# Patient Record
Sex: Female | Born: 1935 | Race: White | Hispanic: No | Smoking: Never smoker
Health system: Southern US, Community
[De-identification: ages and names within clinical notes are randomized; demographics above are authoritative.]

## PROBLEM LIST (undated history)

## (undated) DIAGNOSIS — W19XXXA Unspecified fall, initial encounter: Secondary | ICD-10-CM

## (undated) DIAGNOSIS — H44009 Unspecified purulent endophthalmitis, unspecified eye: Secondary | ICD-10-CM

## (undated) DIAGNOSIS — S329XXA Fracture of unspecified parts of lumbosacral spine and pelvis, initial encounter for closed fracture: Secondary | ICD-10-CM

## (undated) DIAGNOSIS — C801 Malignant (primary) neoplasm, unspecified: Secondary | ICD-10-CM

## (undated) DIAGNOSIS — R296 Repeated falls: Secondary | ICD-10-CM

## (undated) HISTORY — DX: Malignant (primary) neoplasm, unspecified: C80.1

## (undated) HISTORY — PX: ABDOMINAL HYSTERECTOMY: SHX81

---

## 2005-02-28 ENCOUNTER — Ambulatory Visit: Payer: Self-pay | Admitting: Family Medicine

## 2005-03-02 ENCOUNTER — Ambulatory Visit: Payer: Self-pay | Admitting: Family Medicine

## 2005-09-14 ENCOUNTER — Ambulatory Visit: Payer: Self-pay | Admitting: General Surgery

## 2006-02-21 ENCOUNTER — Other Ambulatory Visit: Payer: Self-pay

## 2006-02-21 ENCOUNTER — Emergency Department: Payer: Self-pay | Admitting: Emergency Medicine

## 2006-05-11 ENCOUNTER — Ambulatory Visit: Payer: Self-pay | Admitting: Family Medicine

## 2006-05-21 ENCOUNTER — Other Ambulatory Visit: Payer: Self-pay

## 2006-05-22 ENCOUNTER — Inpatient Hospital Stay: Payer: Self-pay

## 2007-06-05 ENCOUNTER — Ambulatory Visit: Payer: Self-pay | Admitting: Family Medicine

## 2007-06-11 ENCOUNTER — Ambulatory Visit: Payer: Self-pay | Admitting: Family Medicine

## 2008-06-05 ENCOUNTER — Ambulatory Visit: Payer: Self-pay | Admitting: Family Medicine

## 2008-10-29 ENCOUNTER — Ambulatory Visit: Payer: Self-pay | Admitting: Family Medicine

## 2008-11-19 ENCOUNTER — Encounter: Payer: Self-pay | Admitting: Family Medicine

## 2008-12-12 ENCOUNTER — Encounter: Payer: Self-pay | Admitting: Family Medicine

## 2012-01-03 DIAGNOSIS — H4010X0 Unspecified open-angle glaucoma, stage unspecified: Secondary | ICD-10-CM | POA: Diagnosis not present

## 2012-01-04 DIAGNOSIS — Z1331 Encounter for screening for depression: Secondary | ICD-10-CM | POA: Diagnosis not present

## 2012-01-04 DIAGNOSIS — Z23 Encounter for immunization: Secondary | ICD-10-CM | POA: Diagnosis not present

## 2012-01-04 DIAGNOSIS — Z1339 Encounter for screening examination for other mental health and behavioral disorders: Secondary | ICD-10-CM | POA: Diagnosis not present

## 2012-01-04 DIAGNOSIS — Z0000 Encounter for general adult medical examination without abnormal findings: Secondary | ICD-10-CM | POA: Diagnosis not present

## 2012-04-15 ENCOUNTER — Inpatient Hospital Stay: Payer: Self-pay | Admitting: Internal Medicine

## 2012-04-15 DIAGNOSIS — E871 Hypo-osmolality and hyponatremia: Secondary | ICD-10-CM | POA: Diagnosis not present

## 2012-04-15 DIAGNOSIS — K625 Hemorrhage of anus and rectum: Secondary | ICD-10-CM | POA: Diagnosis not present

## 2012-04-15 DIAGNOSIS — E032 Hypothyroidism due to medicaments and other exogenous substances: Secondary | ICD-10-CM | POA: Diagnosis not present

## 2012-04-15 DIAGNOSIS — K559 Vascular disorder of intestine, unspecified: Secondary | ICD-10-CM | POA: Diagnosis not present

## 2012-04-15 DIAGNOSIS — M810 Age-related osteoporosis without current pathological fracture: Secondary | ICD-10-CM | POA: Diagnosis present

## 2012-04-15 DIAGNOSIS — K922 Gastrointestinal hemorrhage, unspecified: Secondary | ICD-10-CM | POA: Diagnosis not present

## 2012-04-15 DIAGNOSIS — D62 Acute posthemorrhagic anemia: Secondary | ICD-10-CM | POA: Diagnosis not present

## 2012-04-15 DIAGNOSIS — Z801 Family history of malignant neoplasm of trachea, bronchus and lung: Secondary | ICD-10-CM | POA: Diagnosis not present

## 2012-04-15 DIAGNOSIS — Z8249 Family history of ischemic heart disease and other diseases of the circulatory system: Secondary | ICD-10-CM | POA: Diagnosis not present

## 2012-04-15 DIAGNOSIS — Z86010 Personal history of colonic polyps: Secondary | ICD-10-CM | POA: Diagnosis not present

## 2012-04-15 DIAGNOSIS — E039 Hypothyroidism, unspecified: Secondary | ICD-10-CM | POA: Diagnosis present

## 2012-04-15 DIAGNOSIS — Z79899 Other long term (current) drug therapy: Secondary | ICD-10-CM | POA: Diagnosis not present

## 2012-04-15 DIAGNOSIS — K5289 Other specified noninfective gastroenteritis and colitis: Secondary | ICD-10-CM | POA: Diagnosis not present

## 2012-04-15 DIAGNOSIS — Z90710 Acquired absence of both cervix and uterus: Secondary | ICD-10-CM | POA: Diagnosis not present

## 2012-04-15 DIAGNOSIS — K560 Paralytic ileus: Secondary | ICD-10-CM | POA: Diagnosis not present

## 2012-04-15 DIAGNOSIS — R6889 Other general symptoms and signs: Secondary | ICD-10-CM | POA: Diagnosis not present

## 2012-04-15 DIAGNOSIS — I10 Essential (primary) hypertension: Secondary | ICD-10-CM | POA: Diagnosis not present

## 2012-04-15 LAB — COMPREHENSIVE METABOLIC PANEL
Albumin: 4 g/dL (ref 3.4–5.0)
Bilirubin,Total: 0.7 mg/dL (ref 0.2–1.0)
Chloride: 101 mmol/L (ref 98–107)
Co2: 25 mmol/L (ref 21–32)
Creatinine: 0.8 mg/dL (ref 0.60–1.30)
EGFR (African American): >60
EGFR (Non-African Amer.): >60
Glucose: 137 mg/dL — ABNORMAL HIGH (ref 65–99)
SGOT(AST): 26 U/L (ref 15–37)
SGPT (ALT): 21 U/L
Sodium: 134 mmol/L — ABNORMAL LOW (ref 136–145)
Total Protein: 7.8 g/dL (ref 6.4–8.2)

## 2012-04-15 LAB — CBC
MCH: 30.9 pg (ref 26.0–34.0)
MCHC: 33.4 g/dL (ref 32.0–36.0)
Platelet: 210 10*3/uL (ref 150–440)
RBC: 5.04 10*6/uL (ref 3.80–5.20)
WBC: 14.3 10*3/uL — ABNORMAL HIGH (ref 3.6–11.0)

## 2012-04-15 LAB — HEMOGLOBIN: HGB: 14.4 g/dL (ref 12.0–16.0)

## 2012-04-15 LAB — WBCS, STOOL

## 2012-04-15 LAB — PROTIME-INR
INR: 1
Prothrombin Time: 13.5 secs (ref 11.5–14.7)

## 2012-04-15 LAB — APTT: Activated PTT: 28.8 secs (ref 23.6–35.9)

## 2012-04-15 LAB — LIPASE, BLOOD: Lipase: 180 U/L (ref 73–393)

## 2012-04-16 LAB — HEMOGLOBIN
HGB: 13.1 g/dL (ref 12.0–16.0)
HGB: 13.5 g/dL (ref 12.0–16.0)

## 2012-04-16 LAB — SODIUM: Sodium: 131 mmol/L — ABNORMAL LOW (ref 136–145)

## 2012-04-16 LAB — WBC: WBC: 15.7 10*3/uL — ABNORMAL HIGH (ref 3.6–11.0)

## 2012-04-17 LAB — STOOL CULTURE

## 2012-04-17 LAB — SODIUM: Sodium: 138 mmol/L (ref 136–145)

## 2012-04-17 LAB — HEMOGLOBIN: HGB: 12.8 g/dL (ref 12.0–16.0)

## 2012-04-17 LAB — WBC: WBC: 11.7 10*3/uL — ABNORMAL HIGH (ref 3.6–11.0)

## 2012-04-18 LAB — HEMOGLOBIN: HGB: 12.3 g/dL (ref 12.0–16.0)

## 2012-04-19 DIAGNOSIS — I10 Essential (primary) hypertension: Secondary | ICD-10-CM | POA: Diagnosis not present

## 2012-04-19 DIAGNOSIS — K922 Gastrointestinal hemorrhage, unspecified: Secondary | ICD-10-CM | POA: Diagnosis not present

## 2012-04-19 DIAGNOSIS — K559 Vascular disorder of intestine, unspecified: Secondary | ICD-10-CM | POA: Diagnosis not present

## 2012-04-19 DIAGNOSIS — R269 Unspecified abnormalities of gait and mobility: Secondary | ICD-10-CM | POA: Diagnosis not present

## 2012-04-19 DIAGNOSIS — D62 Acute posthemorrhagic anemia: Secondary | ICD-10-CM | POA: Diagnosis not present

## 2012-04-19 DIAGNOSIS — M6281 Muscle weakness (generalized): Secondary | ICD-10-CM | POA: Diagnosis not present

## 2012-04-20 DIAGNOSIS — M6281 Muscle weakness (generalized): Secondary | ICD-10-CM | POA: Diagnosis not present

## 2012-04-20 DIAGNOSIS — R269 Unspecified abnormalities of gait and mobility: Secondary | ICD-10-CM | POA: Diagnosis not present

## 2012-04-20 DIAGNOSIS — D62 Acute posthemorrhagic anemia: Secondary | ICD-10-CM | POA: Diagnosis not present

## 2012-04-20 DIAGNOSIS — K559 Vascular disorder of intestine, unspecified: Secondary | ICD-10-CM | POA: Diagnosis not present

## 2012-04-20 DIAGNOSIS — I10 Essential (primary) hypertension: Secondary | ICD-10-CM | POA: Diagnosis not present

## 2012-04-20 DIAGNOSIS — K922 Gastrointestinal hemorrhage, unspecified: Secondary | ICD-10-CM | POA: Diagnosis not present

## 2012-04-23 DIAGNOSIS — I10 Essential (primary) hypertension: Secondary | ICD-10-CM | POA: Diagnosis not present

## 2012-04-23 DIAGNOSIS — R269 Unspecified abnormalities of gait and mobility: Secondary | ICD-10-CM | POA: Diagnosis not present

## 2012-04-23 DIAGNOSIS — M6281 Muscle weakness (generalized): Secondary | ICD-10-CM | POA: Diagnosis not present

## 2012-04-23 DIAGNOSIS — K559 Vascular disorder of intestine, unspecified: Secondary | ICD-10-CM | POA: Diagnosis not present

## 2012-04-23 DIAGNOSIS — D62 Acute posthemorrhagic anemia: Secondary | ICD-10-CM | POA: Diagnosis not present

## 2012-04-23 DIAGNOSIS — K922 Gastrointestinal hemorrhage, unspecified: Secondary | ICD-10-CM | POA: Diagnosis not present

## 2012-04-24 DIAGNOSIS — K922 Gastrointestinal hemorrhage, unspecified: Secondary | ICD-10-CM | POA: Diagnosis not present

## 2012-04-24 DIAGNOSIS — M6281 Muscle weakness (generalized): Secondary | ICD-10-CM | POA: Diagnosis not present

## 2012-04-24 DIAGNOSIS — K559 Vascular disorder of intestine, unspecified: Secondary | ICD-10-CM | POA: Diagnosis not present

## 2012-04-24 DIAGNOSIS — Z09 Encounter for follow-up examination after completed treatment for conditions other than malignant neoplasm: Secondary | ICD-10-CM | POA: Diagnosis not present

## 2012-04-24 DIAGNOSIS — D62 Acute posthemorrhagic anemia: Secondary | ICD-10-CM | POA: Diagnosis not present

## 2012-04-24 DIAGNOSIS — Z23 Encounter for immunization: Secondary | ICD-10-CM | POA: Diagnosis not present

## 2012-04-24 DIAGNOSIS — I10 Essential (primary) hypertension: Secondary | ICD-10-CM | POA: Diagnosis not present

## 2012-04-24 DIAGNOSIS — Z1331 Encounter for screening for depression: Secondary | ICD-10-CM | POA: Diagnosis not present

## 2012-04-24 DIAGNOSIS — R269 Unspecified abnormalities of gait and mobility: Secondary | ICD-10-CM | POA: Diagnosis not present

## 2012-04-25 DIAGNOSIS — D62 Acute posthemorrhagic anemia: Secondary | ICD-10-CM | POA: Diagnosis not present

## 2012-04-25 DIAGNOSIS — Z09 Encounter for follow-up examination after completed treatment for conditions other than malignant neoplasm: Secondary | ICD-10-CM | POA: Diagnosis not present

## 2012-04-25 DIAGNOSIS — I10 Essential (primary) hypertension: Secondary | ICD-10-CM | POA: Diagnosis not present

## 2012-04-25 DIAGNOSIS — K559 Vascular disorder of intestine, unspecified: Secondary | ICD-10-CM | POA: Diagnosis not present

## 2012-04-25 DIAGNOSIS — M6281 Muscle weakness (generalized): Secondary | ICD-10-CM | POA: Diagnosis not present

## 2012-04-25 DIAGNOSIS — K922 Gastrointestinal hemorrhage, unspecified: Secondary | ICD-10-CM | POA: Diagnosis not present

## 2012-04-25 DIAGNOSIS — R269 Unspecified abnormalities of gait and mobility: Secondary | ICD-10-CM | POA: Diagnosis not present

## 2012-04-26 DIAGNOSIS — K922 Gastrointestinal hemorrhage, unspecified: Secondary | ICD-10-CM | POA: Diagnosis not present

## 2012-04-26 DIAGNOSIS — I10 Essential (primary) hypertension: Secondary | ICD-10-CM | POA: Diagnosis not present

## 2012-04-26 DIAGNOSIS — D62 Acute posthemorrhagic anemia: Secondary | ICD-10-CM | POA: Diagnosis not present

## 2012-04-26 DIAGNOSIS — K559 Vascular disorder of intestine, unspecified: Secondary | ICD-10-CM | POA: Diagnosis not present

## 2012-04-26 DIAGNOSIS — M6281 Muscle weakness (generalized): Secondary | ICD-10-CM | POA: Diagnosis not present

## 2012-04-26 DIAGNOSIS — R269 Unspecified abnormalities of gait and mobility: Secondary | ICD-10-CM | POA: Diagnosis not present

## 2012-04-30 DIAGNOSIS — R269 Unspecified abnormalities of gait and mobility: Secondary | ICD-10-CM | POA: Diagnosis not present

## 2012-04-30 DIAGNOSIS — I10 Essential (primary) hypertension: Secondary | ICD-10-CM | POA: Diagnosis not present

## 2012-04-30 DIAGNOSIS — M6281 Muscle weakness (generalized): Secondary | ICD-10-CM | POA: Diagnosis not present

## 2012-04-30 DIAGNOSIS — K922 Gastrointestinal hemorrhage, unspecified: Secondary | ICD-10-CM | POA: Diagnosis not present

## 2012-04-30 DIAGNOSIS — K559 Vascular disorder of intestine, unspecified: Secondary | ICD-10-CM | POA: Diagnosis not present

## 2012-04-30 DIAGNOSIS — D62 Acute posthemorrhagic anemia: Secondary | ICD-10-CM | POA: Diagnosis not present

## 2012-05-01 DIAGNOSIS — R269 Unspecified abnormalities of gait and mobility: Secondary | ICD-10-CM | POA: Diagnosis not present

## 2012-05-01 DIAGNOSIS — D62 Acute posthemorrhagic anemia: Secondary | ICD-10-CM | POA: Diagnosis not present

## 2012-05-01 DIAGNOSIS — I10 Essential (primary) hypertension: Secondary | ICD-10-CM | POA: Diagnosis not present

## 2012-05-01 DIAGNOSIS — K922 Gastrointestinal hemorrhage, unspecified: Secondary | ICD-10-CM | POA: Diagnosis not present

## 2012-05-01 DIAGNOSIS — M6281 Muscle weakness (generalized): Secondary | ICD-10-CM | POA: Diagnosis not present

## 2012-05-01 DIAGNOSIS — K559 Vascular disorder of intestine, unspecified: Secondary | ICD-10-CM | POA: Diagnosis not present

## 2012-05-02 DIAGNOSIS — D62 Acute posthemorrhagic anemia: Secondary | ICD-10-CM | POA: Diagnosis not present

## 2012-05-02 DIAGNOSIS — R269 Unspecified abnormalities of gait and mobility: Secondary | ICD-10-CM | POA: Diagnosis not present

## 2012-05-02 DIAGNOSIS — M6281 Muscle weakness (generalized): Secondary | ICD-10-CM | POA: Diagnosis not present

## 2012-05-02 DIAGNOSIS — K922 Gastrointestinal hemorrhage, unspecified: Secondary | ICD-10-CM | POA: Diagnosis not present

## 2012-05-02 DIAGNOSIS — I10 Essential (primary) hypertension: Secondary | ICD-10-CM | POA: Diagnosis not present

## 2012-05-02 DIAGNOSIS — K559 Vascular disorder of intestine, unspecified: Secondary | ICD-10-CM | POA: Diagnosis not present

## 2012-05-03 DIAGNOSIS — R269 Unspecified abnormalities of gait and mobility: Secondary | ICD-10-CM | POA: Diagnosis not present

## 2012-05-03 DIAGNOSIS — I10 Essential (primary) hypertension: Secondary | ICD-10-CM | POA: Diagnosis not present

## 2012-05-03 DIAGNOSIS — M6281 Muscle weakness (generalized): Secondary | ICD-10-CM | POA: Diagnosis not present

## 2012-05-03 DIAGNOSIS — D62 Acute posthemorrhagic anemia: Secondary | ICD-10-CM | POA: Diagnosis not present

## 2012-05-03 DIAGNOSIS — K922 Gastrointestinal hemorrhage, unspecified: Secondary | ICD-10-CM | POA: Diagnosis not present

## 2012-05-03 DIAGNOSIS — K559 Vascular disorder of intestine, unspecified: Secondary | ICD-10-CM | POA: Diagnosis not present

## 2012-05-08 DIAGNOSIS — K559 Vascular disorder of intestine, unspecified: Secondary | ICD-10-CM | POA: Diagnosis not present

## 2012-05-08 DIAGNOSIS — M6281 Muscle weakness (generalized): Secondary | ICD-10-CM | POA: Diagnosis not present

## 2012-05-08 DIAGNOSIS — I10 Essential (primary) hypertension: Secondary | ICD-10-CM | POA: Diagnosis not present

## 2012-05-08 DIAGNOSIS — D62 Acute posthemorrhagic anemia: Secondary | ICD-10-CM | POA: Diagnosis not present

## 2012-05-08 DIAGNOSIS — R269 Unspecified abnormalities of gait and mobility: Secondary | ICD-10-CM | POA: Diagnosis not present

## 2012-05-08 DIAGNOSIS — K922 Gastrointestinal hemorrhage, unspecified: Secondary | ICD-10-CM | POA: Diagnosis not present

## 2012-05-10 DIAGNOSIS — K559 Vascular disorder of intestine, unspecified: Secondary | ICD-10-CM | POA: Diagnosis not present

## 2012-05-10 DIAGNOSIS — D62 Acute posthemorrhagic anemia: Secondary | ICD-10-CM | POA: Diagnosis not present

## 2012-05-10 DIAGNOSIS — K922 Gastrointestinal hemorrhage, unspecified: Secondary | ICD-10-CM | POA: Diagnosis not present

## 2012-05-10 DIAGNOSIS — R269 Unspecified abnormalities of gait and mobility: Secondary | ICD-10-CM | POA: Diagnosis not present

## 2012-05-10 DIAGNOSIS — M6281 Muscle weakness (generalized): Secondary | ICD-10-CM | POA: Diagnosis not present

## 2012-05-10 DIAGNOSIS — I10 Essential (primary) hypertension: Secondary | ICD-10-CM | POA: Diagnosis not present

## 2012-05-14 DIAGNOSIS — K922 Gastrointestinal hemorrhage, unspecified: Secondary | ICD-10-CM | POA: Diagnosis not present

## 2012-05-14 DIAGNOSIS — I10 Essential (primary) hypertension: Secondary | ICD-10-CM | POA: Diagnosis not present

## 2012-05-14 DIAGNOSIS — R269 Unspecified abnormalities of gait and mobility: Secondary | ICD-10-CM | POA: Diagnosis not present

## 2012-05-14 DIAGNOSIS — D62 Acute posthemorrhagic anemia: Secondary | ICD-10-CM | POA: Diagnosis not present

## 2012-05-14 DIAGNOSIS — M6281 Muscle weakness (generalized): Secondary | ICD-10-CM | POA: Diagnosis not present

## 2012-05-14 DIAGNOSIS — K559 Vascular disorder of intestine, unspecified: Secondary | ICD-10-CM | POA: Diagnosis not present

## 2012-05-16 DIAGNOSIS — R269 Unspecified abnormalities of gait and mobility: Secondary | ICD-10-CM | POA: Diagnosis not present

## 2012-05-16 DIAGNOSIS — D62 Acute posthemorrhagic anemia: Secondary | ICD-10-CM | POA: Diagnosis not present

## 2012-05-16 DIAGNOSIS — I10 Essential (primary) hypertension: Secondary | ICD-10-CM | POA: Diagnosis not present

## 2012-05-16 DIAGNOSIS — K922 Gastrointestinal hemorrhage, unspecified: Secondary | ICD-10-CM | POA: Diagnosis not present

## 2012-05-22 DIAGNOSIS — M6281 Muscle weakness (generalized): Secondary | ICD-10-CM | POA: Diagnosis not present

## 2012-05-22 DIAGNOSIS — D62 Acute posthemorrhagic anemia: Secondary | ICD-10-CM | POA: Diagnosis not present

## 2012-05-22 DIAGNOSIS — R269 Unspecified abnormalities of gait and mobility: Secondary | ICD-10-CM | POA: Diagnosis not present

## 2012-05-22 DIAGNOSIS — K922 Gastrointestinal hemorrhage, unspecified: Secondary | ICD-10-CM | POA: Diagnosis not present

## 2012-05-22 DIAGNOSIS — K559 Vascular disorder of intestine, unspecified: Secondary | ICD-10-CM | POA: Diagnosis not present

## 2012-05-22 DIAGNOSIS — I10 Essential (primary) hypertension: Secondary | ICD-10-CM | POA: Diagnosis not present

## 2012-05-31 DIAGNOSIS — M1990 Unspecified osteoarthritis, unspecified site: Secondary | ICD-10-CM | POA: Diagnosis not present

## 2012-05-31 DIAGNOSIS — E039 Hypothyroidism, unspecified: Secondary | ICD-10-CM | POA: Diagnosis not present

## 2012-05-31 DIAGNOSIS — I10 Essential (primary) hypertension: Secondary | ICD-10-CM | POA: Diagnosis not present

## 2012-05-31 DIAGNOSIS — K559 Vascular disorder of intestine, unspecified: Secondary | ICD-10-CM | POA: Diagnosis not present

## 2012-07-02 DIAGNOSIS — H40009 Preglaucoma, unspecified, unspecified eye: Secondary | ICD-10-CM | POA: Diagnosis not present

## 2012-08-06 ENCOUNTER — Inpatient Hospital Stay: Payer: Self-pay | Admitting: Orthopedic Surgery

## 2012-08-06 DIAGNOSIS — Z4789 Encounter for other orthopedic aftercare: Secondary | ICD-10-CM | POA: Diagnosis not present

## 2012-08-06 DIAGNOSIS — Z9181 History of falling: Secondary | ICD-10-CM | POA: Diagnosis not present

## 2012-08-06 DIAGNOSIS — Z8041 Family history of malignant neoplasm of ovary: Secondary | ICD-10-CM | POA: Diagnosis not present

## 2012-08-06 DIAGNOSIS — R9431 Abnormal electrocardiogram [ECG] [EKG]: Secondary | ICD-10-CM | POA: Diagnosis not present

## 2012-08-06 DIAGNOSIS — Z801 Family history of malignant neoplasm of trachea, bronchus and lung: Secondary | ICD-10-CM | POA: Diagnosis not present

## 2012-08-06 DIAGNOSIS — IMO0002 Reserved for concepts with insufficient information to code with codable children: Secondary | ICD-10-CM | POA: Diagnosis not present

## 2012-08-06 DIAGNOSIS — Z90710 Acquired absence of both cervix and uterus: Secondary | ICD-10-CM | POA: Diagnosis not present

## 2012-08-06 DIAGNOSIS — H409 Unspecified glaucoma: Secondary | ICD-10-CM | POA: Diagnosis not present

## 2012-08-06 DIAGNOSIS — Z0389 Encounter for observation for other suspected diseases and conditions ruled out: Secondary | ICD-10-CM | POA: Diagnosis not present

## 2012-08-06 DIAGNOSIS — S72009A Fracture of unspecified part of neck of unspecified femur, initial encounter for closed fracture: Secondary | ICD-10-CM | POA: Diagnosis not present

## 2012-08-06 DIAGNOSIS — I959 Hypotension, unspecified: Secondary | ICD-10-CM | POA: Diagnosis not present

## 2012-08-06 DIAGNOSIS — S72143A Displaced intertrochanteric fracture of unspecified femur, initial encounter for closed fracture: Secondary | ICD-10-CM | POA: Diagnosis not present

## 2012-08-06 DIAGNOSIS — Z01818 Encounter for other preprocedural examination: Secondary | ICD-10-CM | POA: Diagnosis not present

## 2012-08-06 DIAGNOSIS — Z8249 Family history of ischemic heart disease and other diseases of the circulatory system: Secondary | ICD-10-CM | POA: Diagnosis not present

## 2012-08-06 DIAGNOSIS — R079 Chest pain, unspecified: Secondary | ICD-10-CM | POA: Diagnosis not present

## 2012-08-06 DIAGNOSIS — Z803 Family history of malignant neoplasm of breast: Secondary | ICD-10-CM | POA: Diagnosis not present

## 2012-08-06 DIAGNOSIS — S7290XD Unspecified fracture of unspecified femur, subsequent encounter for closed fracture with routine healing: Secondary | ICD-10-CM | POA: Diagnosis not present

## 2012-08-06 DIAGNOSIS — Z79899 Other long term (current) drug therapy: Secondary | ICD-10-CM | POA: Diagnosis not present

## 2012-08-06 DIAGNOSIS — R6889 Other general symptoms and signs: Secondary | ICD-10-CM | POA: Diagnosis not present

## 2012-08-06 DIAGNOSIS — Z5189 Encounter for other specified aftercare: Secondary | ICD-10-CM | POA: Diagnosis not present

## 2012-08-06 DIAGNOSIS — R269 Unspecified abnormalities of gait and mobility: Secondary | ICD-10-CM | POA: Diagnosis not present

## 2012-08-06 DIAGNOSIS — Z8711 Personal history of peptic ulcer disease: Secondary | ICD-10-CM | POA: Diagnosis not present

## 2012-08-06 DIAGNOSIS — M810 Age-related osteoporosis without current pathological fracture: Secondary | ICD-10-CM | POA: Diagnosis not present

## 2012-08-06 DIAGNOSIS — E871 Hypo-osmolality and hyponatremia: Secondary | ICD-10-CM | POA: Diagnosis present

## 2012-08-06 DIAGNOSIS — M6281 Muscle weakness (generalized): Secondary | ICD-10-CM | POA: Diagnosis not present

## 2012-08-06 DIAGNOSIS — D62 Acute posthemorrhagic anemia: Secondary | ICD-10-CM | POA: Diagnosis not present

## 2012-08-06 DIAGNOSIS — Z01810 Encounter for preprocedural cardiovascular examination: Secondary | ICD-10-CM | POA: Diagnosis not present

## 2012-08-06 DIAGNOSIS — I10 Essential (primary) hypertension: Secondary | ICD-10-CM | POA: Diagnosis not present

## 2012-08-06 DIAGNOSIS — E039 Hypothyroidism, unspecified: Secondary | ICD-10-CM | POA: Diagnosis not present

## 2012-08-06 LAB — BASIC METABOLIC PANEL
Anion Gap: 6 — ABNORMAL LOW (ref 7–16)
Calcium, Total: 8.7 mg/dL (ref 8.5–10.1)
Chloride: 101 mmol/L (ref 98–107)
Co2: 26 mmol/L (ref 21–32)
EGFR (Non-African Amer.): 58 — ABNORMAL LOW
Osmolality: 271 (ref 275–301)

## 2012-08-06 LAB — CBC
HCT: 39.1 % (ref 35.0–47.0)
HGB: 13.7 g/dL (ref 12.0–16.0)
MCH: 32.1 pg (ref 26.0–34.0)
Platelet: 210 10*3/uL (ref 150–440)
RBC: 4.27 10*6/uL (ref 3.80–5.20)
RDW: 13.6 % (ref 11.5–14.5)
WBC: 7.5 10*3/uL (ref 3.6–11.0)

## 2012-08-06 LAB — URINALYSIS, COMPLETE
Bilirubin,UR: NEGATIVE
Blood: NEGATIVE
Nitrite: NEGATIVE
Ph: 8 (ref 4.5–8.0)
Protein: NEGATIVE
RBC,UR: 1 /HPF (ref 0–5)
Squamous Epithelial: <1

## 2012-08-06 LAB — CK TOTAL AND CKMB (NOT AT ARMC): CK-MB: 2.7 ng/mL (ref 0.5–3.6)

## 2012-08-06 LAB — TSH: Thyroid Stimulating Horm: 3.59 u[IU]/mL

## 2012-08-06 LAB — PROTIME-INR
INR: 1
Prothrombin Time: 13.7 secs (ref 11.5–14.7)

## 2012-08-07 DIAGNOSIS — S72009A Fracture of unspecified part of neck of unspecified femur, initial encounter for closed fracture: Secondary | ICD-10-CM | POA: Diagnosis not present

## 2012-08-07 LAB — CBC WITH DIFFERENTIAL/PLATELET
Basophil #: 0 10*3/uL (ref 0.0–0.1)
Basophil %: 0.3 %
Eosinophil #: 0.2 10*3/uL (ref 0.0–0.7)
Eosinophil %: 0.6 %
HGB: 12.1 g/dL (ref 12.0–16.0)
HGB: 9.2 g/dL — ABNORMAL LOW (ref 12.0–16.0)
Lymphocyte #: 2.1 10*3/uL (ref 1.0–3.6)
Lymphocyte #: 2.1 10*3/uL (ref 1.0–3.6)
Lymphocyte %: 15.4 %
MCH: 30.7 pg (ref 26.0–34.0)
MCHC: 33.3 g/dL (ref 32.0–36.0)
MCV: 92 fL (ref 80–100)
Monocyte #: 0.9 x10 3/mm (ref 0.2–0.9)
Monocyte #: 1.3 x10 3/mm — ABNORMAL HIGH (ref 0.2–0.9)
Monocyte %: 8.9 %
Neutrophil %: 73.4 %
Platelet: 177 10*3/uL (ref 150–440)
Platelet: 157 10*3/uL (ref 150–440)
RBC: 2.99 10*6/uL — ABNORMAL LOW (ref 3.80–5.20)
RDW: 13.4 % (ref 11.5–14.5)
RDW: 13.1 % (ref 11.5–14.5)
WBC: 10.4 10*3/uL (ref 3.6–11.0)

## 2012-08-07 LAB — BASIC METABOLIC PANEL
Anion Gap: 7 (ref 7–16)
BUN: 19 mg/dL — ABNORMAL HIGH (ref 7–18)
Calcium, Total: 8.3 mg/dL — ABNORMAL LOW (ref 8.5–10.1)
Creatinine: 0.53 mg/dL — ABNORMAL LOW (ref 0.60–1.30)
EGFR (Non-African Amer.): >60
Glucose: 108 mg/dL — ABNORMAL HIGH (ref 65–99)
Osmolality: 275 (ref 275–301)
Potassium: 3.7 mmol/L (ref 3.5–5.1)

## 2012-08-08 LAB — CBC WITH DIFFERENTIAL/PLATELET
Basophil #: 0 10*3/uL (ref 0.0–0.1)
Basophil %: 0.5 %
Eosinophil %: 2.3 %
HGB: 9 g/dL — ABNORMAL LOW (ref 12.0–16.0)
Lymphocyte %: 23.7 %
MCV: 92 fL (ref 80–100)
Monocyte #: 0.8 x10 3/mm (ref 0.2–0.9)
Neutrophil #: 4.5 10*3/uL (ref 1.4–6.5)
Neutrophil %: 62 %
Platelet: 141 10*3/uL — ABNORMAL LOW (ref 150–440)
RBC: 2.85 10*6/uL — ABNORMAL LOW (ref 3.80–5.20)
WBC: 7.3 10*3/uL (ref 3.6–11.0)

## 2012-08-08 LAB — BASIC METABOLIC PANEL
Anion Gap: 5 — ABNORMAL LOW (ref 7–16)
BUN: 15 mg/dL (ref 7–18)
Calcium, Total: 8.2 mg/dL — ABNORMAL LOW (ref 8.5–10.1)
Creatinine: 0.63 mg/dL (ref 0.60–1.30)
EGFR (African American): >60
EGFR (Non-African Amer.): >60
Glucose: 102 mg/dL — ABNORMAL HIGH (ref 65–99)
Osmolality: 273 (ref 275–301)
Potassium: 4.2 mmol/L (ref 3.5–5.1)
Sodium: 136 mmol/L (ref 136–145)

## 2012-08-09 LAB — CBC WITH DIFFERENTIAL/PLATELET
Basophil #: 0 10*3/uL (ref 0.0–0.1)
Eosinophil #: 0.1 10*3/uL (ref 0.0–0.7)
Eosinophil %: 1.6 %
HCT: 23.5 % — ABNORMAL LOW (ref 35.0–47.0)
Lymphocyte #: 1.6 10*3/uL (ref 1.0–3.6)
Lymphocyte %: 18.7 %
MCH: 32.7 pg (ref 26.0–34.0)
MCHC: 35.6 g/dL (ref 32.0–36.0)
MCV: 92 fL (ref 80–100)
Monocyte #: 1.1 x10 3/mm — ABNORMAL HIGH (ref 0.2–0.9)
Monocyte %: 13.3 %
Neutrophil #: 5.7 10*3/uL (ref 1.4–6.5)
Neutrophil %: 66.1 %
Platelet: 136 10*3/uL — ABNORMAL LOW (ref 150–440)
RDW: 13 % (ref 11.5–14.5)

## 2012-08-10 ENCOUNTER — Encounter: Payer: Self-pay | Admitting: Internal Medicine

## 2012-08-10 DIAGNOSIS — Z5189 Encounter for other specified aftercare: Secondary | ICD-10-CM | POA: Diagnosis not present

## 2012-08-10 DIAGNOSIS — S7290XD Unspecified fracture of unspecified femur, subsequent encounter for closed fracture with routine healing: Secondary | ICD-10-CM | POA: Diagnosis not present

## 2012-08-10 DIAGNOSIS — S72143A Displaced intertrochanteric fracture of unspecified femur, initial encounter for closed fracture: Secondary | ICD-10-CM | POA: Diagnosis not present

## 2012-08-10 DIAGNOSIS — M810 Age-related osteoporosis without current pathological fracture: Secondary | ICD-10-CM | POA: Diagnosis not present

## 2012-08-10 DIAGNOSIS — R6889 Other general symptoms and signs: Secondary | ICD-10-CM | POA: Diagnosis not present

## 2012-08-10 DIAGNOSIS — R269 Unspecified abnormalities of gait and mobility: Secondary | ICD-10-CM | POA: Diagnosis not present

## 2012-08-10 DIAGNOSIS — R9431 Abnormal electrocardiogram [ECG] [EKG]: Secondary | ICD-10-CM | POA: Diagnosis not present

## 2012-08-10 DIAGNOSIS — Z4789 Encounter for other orthopedic aftercare: Secondary | ICD-10-CM | POA: Diagnosis not present

## 2012-08-10 DIAGNOSIS — Z9181 History of falling: Secondary | ICD-10-CM | POA: Diagnosis not present

## 2012-08-10 DIAGNOSIS — I10 Essential (primary) hypertension: Secondary | ICD-10-CM | POA: Diagnosis not present

## 2012-08-10 DIAGNOSIS — E039 Hypothyroidism, unspecified: Secondary | ICD-10-CM | POA: Diagnosis not present

## 2012-08-10 DIAGNOSIS — M6281 Muscle weakness (generalized): Secondary | ICD-10-CM | POA: Diagnosis not present

## 2012-08-10 DIAGNOSIS — Z01818 Encounter for other preprocedural examination: Secondary | ICD-10-CM | POA: Diagnosis not present

## 2012-08-10 LAB — BASIC METABOLIC PANEL
BUN: 13 mg/dL (ref 7–18)
Calcium, Total: 7.7 mg/dL — ABNORMAL LOW (ref 8.5–10.1)
Chloride: 105 mmol/L (ref 98–107)
EGFR (African American): >60
EGFR (Non-African Amer.): >60
Glucose: 96 mg/dL (ref 65–99)
Osmolality: 274 (ref 275–301)
Potassium: 3.7 mmol/L (ref 3.5–5.1)
Sodium: 137 mmol/L (ref 136–145)

## 2012-08-10 LAB — CBC WITH DIFFERENTIAL/PLATELET
Basophil %: 0.4 %
Eosinophil #: 0.2 10*3/uL (ref 0.0–0.7)
Eosinophil %: 2.6 %
HCT: 26.3 % — ABNORMAL LOW (ref 35.0–47.0)
HGB: 9.3 g/dL — ABNORMAL LOW (ref 12.0–16.0)
Lymphocyte #: 1.6 10*3/uL (ref 1.0–3.6)
MCH: 31.9 pg (ref 26.0–34.0)
MCHC: 35.6 g/dL (ref 32.0–36.0)
MCV: 90 fL (ref 80–100)
Monocyte #: 0.9 x10 3/mm (ref 0.2–0.9)
Monocyte %: 10.7 %
Neutrophil #: 5.7 10*3/uL (ref 1.4–6.5)
Neutrophil %: 67.2 %
Platelet: 154 10*3/uL (ref 150–440)

## 2012-08-12 ENCOUNTER — Encounter: Payer: Self-pay | Admitting: Internal Medicine

## 2012-08-14 LAB — URINALYSIS, COMPLETE
Bacteria: NONE SEEN
Blood: NEGATIVE
Glucose,UR: NEGATIVE mg/dL (ref 0–75)
Leukocyte Esterase: NEGATIVE
Nitrite: NEGATIVE
Ph: 7 (ref 4.5–8.0)
Protein: NEGATIVE
RBC,UR: NONE SEEN /HPF (ref 0–5)
Specific Gravity: 1.014 (ref 1.003–1.030)

## 2012-08-14 LAB — COMPREHENSIVE METABOLIC PANEL
Albumin: 2.7 g/dL — ABNORMAL LOW (ref 3.4–5.0)
Alkaline Phosphatase: 79 U/L (ref 50–136)
Anion Gap: 7 (ref 7–16)
BUN: 20 mg/dL — ABNORMAL HIGH (ref 7–18)
Bilirubin,Total: 1 mg/dL (ref 0.2–1.0)
Creatinine: 0.74 mg/dL (ref 0.60–1.30)
SGPT (ALT): 22 U/L (ref 12–78)
Sodium: 136 mmol/L (ref 136–145)
Total Protein: 5.8 g/dL — ABNORMAL LOW (ref 6.4–8.2)

## 2012-08-14 LAB — CBC WITH DIFFERENTIAL/PLATELET
Basophil #: 0 10*3/uL (ref 0.0–0.1)
Lymphocyte #: 1.3 10*3/uL (ref 1.0–3.6)
Lymphocyte %: 17.7 %
MCH: 30.8 pg (ref 26.0–34.0)
MCV: 94 fL (ref 80–100)
Monocyte #: 0.7 x10 3/mm (ref 0.2–0.9)
Monocyte %: 9.9 %
Platelet: 314 10*3/uL (ref 150–440)
RDW: 13.9 % (ref 11.5–14.5)
WBC: 7.5 10*3/uL (ref 3.6–11.0)

## 2012-08-16 DIAGNOSIS — E039 Hypothyroidism, unspecified: Secondary | ICD-10-CM | POA: Diagnosis not present

## 2012-08-16 DIAGNOSIS — M810 Age-related osteoporosis without current pathological fracture: Secondary | ICD-10-CM | POA: Diagnosis not present

## 2012-08-16 DIAGNOSIS — I10 Essential (primary) hypertension: Secondary | ICD-10-CM | POA: Diagnosis not present

## 2012-08-16 LAB — URINE CULTURE

## 2012-08-22 DIAGNOSIS — S72143A Displaced intertrochanteric fracture of unspecified femur, initial encounter for closed fracture: Secondary | ICD-10-CM | POA: Diagnosis not present

## 2012-09-11 ENCOUNTER — Encounter: Payer: Self-pay | Admitting: Internal Medicine

## 2012-09-25 DIAGNOSIS — S72143A Displaced intertrochanteric fracture of unspecified femur, initial encounter for closed fracture: Secondary | ICD-10-CM | POA: Diagnosis not present

## 2012-09-26 DIAGNOSIS — IMO0001 Reserved for inherently not codable concepts without codable children: Secondary | ICD-10-CM | POA: Diagnosis not present

## 2012-09-26 DIAGNOSIS — H409 Unspecified glaucoma: Secondary | ICD-10-CM | POA: Diagnosis not present

## 2012-09-26 DIAGNOSIS — S7290XD Unspecified fracture of unspecified femur, subsequent encounter for closed fracture with routine healing: Secondary | ICD-10-CM | POA: Diagnosis not present

## 2012-09-26 DIAGNOSIS — R269 Unspecified abnormalities of gait and mobility: Secondary | ICD-10-CM | POA: Diagnosis not present

## 2012-09-26 DIAGNOSIS — M810 Age-related osteoporosis without current pathological fracture: Secondary | ICD-10-CM | POA: Diagnosis not present

## 2012-09-26 DIAGNOSIS — I10 Essential (primary) hypertension: Secondary | ICD-10-CM | POA: Diagnosis not present

## 2012-09-27 DIAGNOSIS — I10 Essential (primary) hypertension: Secondary | ICD-10-CM | POA: Diagnosis not present

## 2012-09-27 DIAGNOSIS — R269 Unspecified abnormalities of gait and mobility: Secondary | ICD-10-CM | POA: Diagnosis not present

## 2012-09-27 DIAGNOSIS — M810 Age-related osteoporosis without current pathological fracture: Secondary | ICD-10-CM | POA: Diagnosis not present

## 2012-09-27 DIAGNOSIS — IMO0001 Reserved for inherently not codable concepts without codable children: Secondary | ICD-10-CM | POA: Diagnosis not present

## 2012-09-27 DIAGNOSIS — S7290XD Unspecified fracture of unspecified femur, subsequent encounter for closed fracture with routine healing: Secondary | ICD-10-CM | POA: Diagnosis not present

## 2012-09-27 DIAGNOSIS — H409 Unspecified glaucoma: Secondary | ICD-10-CM | POA: Diagnosis not present

## 2012-10-01 DIAGNOSIS — I10 Essential (primary) hypertension: Secondary | ICD-10-CM | POA: Diagnosis not present

## 2012-10-01 DIAGNOSIS — R269 Unspecified abnormalities of gait and mobility: Secondary | ICD-10-CM | POA: Diagnosis not present

## 2012-10-01 DIAGNOSIS — H409 Unspecified glaucoma: Secondary | ICD-10-CM | POA: Diagnosis not present

## 2012-10-01 DIAGNOSIS — M810 Age-related osteoporosis without current pathological fracture: Secondary | ICD-10-CM | POA: Diagnosis not present

## 2012-10-01 DIAGNOSIS — S7290XD Unspecified fracture of unspecified femur, subsequent encounter for closed fracture with routine healing: Secondary | ICD-10-CM | POA: Diagnosis not present

## 2012-10-01 DIAGNOSIS — IMO0001 Reserved for inherently not codable concepts without codable children: Secondary | ICD-10-CM | POA: Diagnosis not present

## 2012-10-03 DIAGNOSIS — M810 Age-related osteoporosis without current pathological fracture: Secondary | ICD-10-CM | POA: Diagnosis not present

## 2012-10-03 DIAGNOSIS — IMO0001 Reserved for inherently not codable concepts without codable children: Secondary | ICD-10-CM | POA: Diagnosis not present

## 2012-10-03 DIAGNOSIS — H409 Unspecified glaucoma: Secondary | ICD-10-CM | POA: Diagnosis not present

## 2012-10-03 DIAGNOSIS — S7290XD Unspecified fracture of unspecified femur, subsequent encounter for closed fracture with routine healing: Secondary | ICD-10-CM | POA: Diagnosis not present

## 2012-10-03 DIAGNOSIS — R269 Unspecified abnormalities of gait and mobility: Secondary | ICD-10-CM | POA: Diagnosis not present

## 2012-10-03 DIAGNOSIS — I10 Essential (primary) hypertension: Secondary | ICD-10-CM | POA: Diagnosis not present

## 2012-10-04 DIAGNOSIS — H409 Unspecified glaucoma: Secondary | ICD-10-CM | POA: Diagnosis not present

## 2012-10-04 DIAGNOSIS — S7290XD Unspecified fracture of unspecified femur, subsequent encounter for closed fracture with routine healing: Secondary | ICD-10-CM | POA: Diagnosis not present

## 2012-10-04 DIAGNOSIS — M810 Age-related osteoporosis without current pathological fracture: Secondary | ICD-10-CM | POA: Diagnosis not present

## 2012-10-04 DIAGNOSIS — I10 Essential (primary) hypertension: Secondary | ICD-10-CM | POA: Diagnosis not present

## 2012-10-04 DIAGNOSIS — IMO0001 Reserved for inherently not codable concepts without codable children: Secondary | ICD-10-CM | POA: Diagnosis not present

## 2012-10-04 DIAGNOSIS — R269 Unspecified abnormalities of gait and mobility: Secondary | ICD-10-CM | POA: Diagnosis not present

## 2012-10-08 DIAGNOSIS — R269 Unspecified abnormalities of gait and mobility: Secondary | ICD-10-CM | POA: Diagnosis not present

## 2012-10-08 DIAGNOSIS — IMO0001 Reserved for inherently not codable concepts without codable children: Secondary | ICD-10-CM | POA: Diagnosis not present

## 2012-10-08 DIAGNOSIS — I10 Essential (primary) hypertension: Secondary | ICD-10-CM | POA: Diagnosis not present

## 2012-10-08 DIAGNOSIS — H409 Unspecified glaucoma: Secondary | ICD-10-CM | POA: Diagnosis not present

## 2012-10-08 DIAGNOSIS — S7290XD Unspecified fracture of unspecified femur, subsequent encounter for closed fracture with routine healing: Secondary | ICD-10-CM | POA: Diagnosis not present

## 2012-10-08 DIAGNOSIS — M810 Age-related osteoporosis without current pathological fracture: Secondary | ICD-10-CM | POA: Diagnosis not present

## 2012-10-10 DIAGNOSIS — S7290XD Unspecified fracture of unspecified femur, subsequent encounter for closed fracture with routine healing: Secondary | ICD-10-CM | POA: Diagnosis not present

## 2012-10-10 DIAGNOSIS — IMO0001 Reserved for inherently not codable concepts without codable children: Secondary | ICD-10-CM | POA: Diagnosis not present

## 2012-10-10 DIAGNOSIS — I10 Essential (primary) hypertension: Secondary | ICD-10-CM | POA: Diagnosis not present

## 2012-10-10 DIAGNOSIS — R269 Unspecified abnormalities of gait and mobility: Secondary | ICD-10-CM | POA: Diagnosis not present

## 2012-10-10 DIAGNOSIS — H409 Unspecified glaucoma: Secondary | ICD-10-CM | POA: Diagnosis not present

## 2012-10-10 DIAGNOSIS — M810 Age-related osteoporosis without current pathological fracture: Secondary | ICD-10-CM | POA: Diagnosis not present

## 2012-10-11 DIAGNOSIS — S7290XD Unspecified fracture of unspecified femur, subsequent encounter for closed fracture with routine healing: Secondary | ICD-10-CM | POA: Diagnosis not present

## 2012-10-11 DIAGNOSIS — H409 Unspecified glaucoma: Secondary | ICD-10-CM | POA: Diagnosis not present

## 2012-10-11 DIAGNOSIS — R269 Unspecified abnormalities of gait and mobility: Secondary | ICD-10-CM | POA: Diagnosis not present

## 2012-10-11 DIAGNOSIS — IMO0001 Reserved for inherently not codable concepts without codable children: Secondary | ICD-10-CM | POA: Diagnosis not present

## 2012-10-11 DIAGNOSIS — I10 Essential (primary) hypertension: Secondary | ICD-10-CM | POA: Diagnosis not present

## 2012-10-11 DIAGNOSIS — M810 Age-related osteoporosis without current pathological fracture: Secondary | ICD-10-CM | POA: Diagnosis not present

## 2012-10-17 DIAGNOSIS — R269 Unspecified abnormalities of gait and mobility: Secondary | ICD-10-CM | POA: Diagnosis not present

## 2012-10-17 DIAGNOSIS — IMO0001 Reserved for inherently not codable concepts without codable children: Secondary | ICD-10-CM | POA: Diagnosis not present

## 2012-10-17 DIAGNOSIS — H409 Unspecified glaucoma: Secondary | ICD-10-CM | POA: Diagnosis not present

## 2012-10-17 DIAGNOSIS — I10 Essential (primary) hypertension: Secondary | ICD-10-CM | POA: Diagnosis not present

## 2012-10-17 DIAGNOSIS — S7290XD Unspecified fracture of unspecified femur, subsequent encounter for closed fracture with routine healing: Secondary | ICD-10-CM | POA: Diagnosis not present

## 2012-10-17 DIAGNOSIS — M810 Age-related osteoporosis without current pathological fracture: Secondary | ICD-10-CM | POA: Diagnosis not present

## 2012-10-18 DIAGNOSIS — M810 Age-related osteoporosis without current pathological fracture: Secondary | ICD-10-CM | POA: Diagnosis not present

## 2012-10-18 DIAGNOSIS — IMO0001 Reserved for inherently not codable concepts without codable children: Secondary | ICD-10-CM | POA: Diagnosis not present

## 2012-10-18 DIAGNOSIS — I10 Essential (primary) hypertension: Secondary | ICD-10-CM | POA: Diagnosis not present

## 2012-10-18 DIAGNOSIS — H409 Unspecified glaucoma: Secondary | ICD-10-CM | POA: Diagnosis not present

## 2012-10-18 DIAGNOSIS — R269 Unspecified abnormalities of gait and mobility: Secondary | ICD-10-CM | POA: Diagnosis not present

## 2012-10-18 DIAGNOSIS — S7290XD Unspecified fracture of unspecified femur, subsequent encounter for closed fracture with routine healing: Secondary | ICD-10-CM | POA: Diagnosis not present

## 2012-10-22 DIAGNOSIS — I10 Essential (primary) hypertension: Secondary | ICD-10-CM | POA: Diagnosis not present

## 2012-10-22 DIAGNOSIS — R269 Unspecified abnormalities of gait and mobility: Secondary | ICD-10-CM | POA: Diagnosis not present

## 2012-10-22 DIAGNOSIS — S7290XD Unspecified fracture of unspecified femur, subsequent encounter for closed fracture with routine healing: Secondary | ICD-10-CM | POA: Diagnosis not present

## 2012-10-22 DIAGNOSIS — M810 Age-related osteoporosis without current pathological fracture: Secondary | ICD-10-CM | POA: Diagnosis not present

## 2012-10-22 DIAGNOSIS — IMO0001 Reserved for inherently not codable concepts without codable children: Secondary | ICD-10-CM | POA: Diagnosis not present

## 2012-10-22 DIAGNOSIS — H409 Unspecified glaucoma: Secondary | ICD-10-CM | POA: Diagnosis not present

## 2012-10-24 DIAGNOSIS — I10 Essential (primary) hypertension: Secondary | ICD-10-CM | POA: Diagnosis not present

## 2012-10-24 DIAGNOSIS — R269 Unspecified abnormalities of gait and mobility: Secondary | ICD-10-CM | POA: Diagnosis not present

## 2012-10-24 DIAGNOSIS — S7290XD Unspecified fracture of unspecified femur, subsequent encounter for closed fracture with routine healing: Secondary | ICD-10-CM | POA: Diagnosis not present

## 2012-10-24 DIAGNOSIS — H409 Unspecified glaucoma: Secondary | ICD-10-CM | POA: Diagnosis not present

## 2012-10-24 DIAGNOSIS — M810 Age-related osteoporosis without current pathological fracture: Secondary | ICD-10-CM | POA: Diagnosis not present

## 2012-10-24 DIAGNOSIS — IMO0001 Reserved for inherently not codable concepts without codable children: Secondary | ICD-10-CM | POA: Diagnosis not present

## 2012-11-16 DIAGNOSIS — K559 Vascular disorder of intestine, unspecified: Secondary | ICD-10-CM | POA: Diagnosis not present

## 2012-11-16 DIAGNOSIS — M1990 Unspecified osteoarthritis, unspecified site: Secondary | ICD-10-CM | POA: Diagnosis not present

## 2012-11-16 DIAGNOSIS — E039 Hypothyroidism, unspecified: Secondary | ICD-10-CM | POA: Diagnosis not present

## 2012-11-16 DIAGNOSIS — N390 Urinary tract infection, site not specified: Secondary | ICD-10-CM | POA: Diagnosis not present

## 2012-11-22 DIAGNOSIS — S72143A Displaced intertrochanteric fracture of unspecified femur, initial encounter for closed fracture: Secondary | ICD-10-CM | POA: Diagnosis not present

## 2012-11-23 DIAGNOSIS — E039 Hypothyroidism, unspecified: Secondary | ICD-10-CM | POA: Diagnosis not present

## 2012-11-23 DIAGNOSIS — K559 Vascular disorder of intestine, unspecified: Secondary | ICD-10-CM | POA: Diagnosis not present

## 2012-11-23 DIAGNOSIS — Z888 Allergy status to other drugs, medicaments and biological substances status: Secondary | ICD-10-CM | POA: Diagnosis not present

## 2012-11-23 DIAGNOSIS — M1990 Unspecified osteoarthritis, unspecified site: Secondary | ICD-10-CM | POA: Diagnosis not present

## 2012-12-31 DIAGNOSIS — H4010X0 Unspecified open-angle glaucoma, stage unspecified: Secondary | ICD-10-CM | POA: Diagnosis not present

## 2013-04-02 DIAGNOSIS — R609 Edema, unspecified: Secondary | ICD-10-CM | POA: Diagnosis not present

## 2013-04-02 DIAGNOSIS — E039 Hypothyroidism, unspecified: Secondary | ICD-10-CM | POA: Diagnosis not present

## 2013-04-10 DIAGNOSIS — R609 Edema, unspecified: Secondary | ICD-10-CM | POA: Diagnosis not present

## 2013-04-10 DIAGNOSIS — E039 Hypothyroidism, unspecified: Secondary | ICD-10-CM | POA: Diagnosis not present

## 2013-04-15 DIAGNOSIS — S72143A Displaced intertrochanteric fracture of unspecified femur, initial encounter for closed fracture: Secondary | ICD-10-CM | POA: Diagnosis not present

## 2013-07-15 DIAGNOSIS — H4010X0 Unspecified open-angle glaucoma, stage unspecified: Secondary | ICD-10-CM | POA: Diagnosis not present

## 2013-09-11 DIAGNOSIS — L719 Rosacea, unspecified: Secondary | ICD-10-CM | POA: Diagnosis not present

## 2013-09-11 DIAGNOSIS — H5710 Ocular pain, unspecified eye: Secondary | ICD-10-CM | POA: Diagnosis not present

## 2013-09-11 DIAGNOSIS — I10 Essential (primary) hypertension: Secondary | ICD-10-CM | POA: Diagnosis not present

## 2013-10-09 DIAGNOSIS — H10509 Unspecified blepharoconjunctivitis, unspecified eye: Secondary | ICD-10-CM | POA: Diagnosis not present

## 2013-12-02 DIAGNOSIS — H1030 Unspecified acute conjunctivitis, unspecified eye: Secondary | ICD-10-CM | POA: Diagnosis not present

## 2013-12-09 DIAGNOSIS — H169 Unspecified keratitis: Secondary | ICD-10-CM | POA: Diagnosis not present

## 2014-03-17 DIAGNOSIS — H40009 Preglaucoma, unspecified, unspecified eye: Secondary | ICD-10-CM | POA: Diagnosis not present

## 2014-07-17 ENCOUNTER — Encounter: Payer: Self-pay | Admitting: Podiatrist

## 2014-07-17 ENCOUNTER — Ambulatory Visit (INDEPENDENT_AMBULATORY_CARE_PROVIDER_SITE_OTHER): Payer: Medicare Other | Admitting: Podiatrist

## 2014-07-17 VITALS — BP 167/91 | HR 54 | Resp 16 | Ht 62.0 in | Wt 120.0 lb

## 2014-07-17 DIAGNOSIS — M79609 Pain in unspecified limb: Secondary | ICD-10-CM | POA: Diagnosis not present

## 2014-07-17 DIAGNOSIS — M79673 Pain in unspecified foot: Secondary | ICD-10-CM

## 2014-07-17 DIAGNOSIS — B351 Tinea unguium: Secondary | ICD-10-CM | POA: Diagnosis not present

## 2014-07-17 NOTE — Patient Instructions (Signed)

## 2014-07-17 NOTE — Progress Notes (Signed)
   Subjective:  ° ° Patient ID: [name], female    DOB: 04/28/1914, 80 y.o.   MRN: [id number] ° °HPI Comments: i need my toenails trimmed. My nails are long and are poking me. They do not hurt. i can not trim my toenails. i try to trim my nails. ° ° ° ° °Review of Systems  °Musculoskeletal:  °     Difficulty walking  °Skin:  °     Change in nails  °Neurological: Positive for tremors.  °Hematological: Bruises/bleeds easily.  °All other systems reviewed and are negative. ° ° °   °Objective:  ° Physical Exam ° °Patient is awake, alert, and oriented x 3.  In no acute distress.  Vascular status is intact with palpable pedal pulses at 1/4 DP and PT bilateral and capillary refill time within normal limits. Neurological sensation is also intact bilaterally via Semmes Weinstein monofilament at 5/5 sites. Light touch, vibratory sensation, Achilles tendon reflex is intact. Dermatological exam reveals skin color, turger and texture as normal. No open lesions present.  Musculature intact with dorsiflexion, plantarflexion, inversion, eversion. ° °Patient's toenails are elongated, thickened, discolored, dystrophic and clinically mycotic 1 through 5 bilateral. They're painful with ambulation and in shoe gear. ° °   °Assessment & Plan:  °Symptomatic mycotic toenails x10 ° °Plan: Debridement of the toenails was carried out today without complication. She will be seen back in 3 months or as needed for followup in the future. °

## 2014-08-11 DIAGNOSIS — E039 Hypothyroidism, unspecified: Secondary | ICD-10-CM | POA: Diagnosis not present

## 2014-08-11 DIAGNOSIS — Z1331 Encounter for screening for depression: Secondary | ICD-10-CM | POA: Diagnosis not present

## 2014-08-11 DIAGNOSIS — Z1339 Encounter for screening examination for other mental health and behavioral disorders: Secondary | ICD-10-CM | POA: Diagnosis not present

## 2014-08-11 DIAGNOSIS — I10 Essential (primary) hypertension: Secondary | ICD-10-CM | POA: Diagnosis not present

## 2014-08-11 DIAGNOSIS — D649 Anemia, unspecified: Secondary | ICD-10-CM | POA: Diagnosis not present

## 2014-08-11 DIAGNOSIS — Z0000 Encounter for general adult medical examination without abnormal findings: Secondary | ICD-10-CM | POA: Diagnosis not present

## 2014-08-11 DIAGNOSIS — M810 Age-related osteoporosis without current pathological fracture: Secondary | ICD-10-CM | POA: Diagnosis not present

## 2014-08-11 DIAGNOSIS — K922 Gastrointestinal hemorrhage, unspecified: Secondary | ICD-10-CM | POA: Diagnosis not present

## 2014-09-13 ENCOUNTER — Observation Stay: Payer: Self-pay | Admitting: Internal Medicine

## 2014-09-13 DIAGNOSIS — H409 Unspecified glaucoma: Secondary | ICD-10-CM | POA: Diagnosis not present

## 2014-09-13 DIAGNOSIS — M25552 Pain in left hip: Secondary | ICD-10-CM | POA: Diagnosis not present

## 2014-09-13 DIAGNOSIS — M810 Age-related osteoporosis without current pathological fracture: Secondary | ICD-10-CM | POA: Diagnosis not present

## 2014-09-13 DIAGNOSIS — W03XXXA Other fall on same level due to collision with another person, initial encounter: Secondary | ICD-10-CM | POA: Diagnosis not present

## 2014-09-13 DIAGNOSIS — S3210XA Unspecified fracture of sacrum, initial encounter for closed fracture: Secondary | ICD-10-CM | POA: Diagnosis not present

## 2014-09-13 DIAGNOSIS — S3282XA Multiple fractures of pelvis without disruption of pelvic ring, initial encounter for closed fracture: Secondary | ICD-10-CM | POA: Diagnosis not present

## 2014-09-13 DIAGNOSIS — T148 Other injury of unspecified body region: Secondary | ICD-10-CM | POA: Diagnosis not present

## 2014-09-13 DIAGNOSIS — Z90710 Acquired absence of both cervix and uterus: Secondary | ICD-10-CM | POA: Diagnosis not present

## 2014-09-13 DIAGNOSIS — I10 Essential (primary) hypertension: Secondary | ICD-10-CM | POA: Diagnosis not present

## 2014-09-13 DIAGNOSIS — M25559 Pain in unspecified hip: Secondary | ICD-10-CM | POA: Diagnosis not present

## 2014-09-13 DIAGNOSIS — R6889 Other general symptoms and signs: Secondary | ICD-10-CM | POA: Diagnosis not present

## 2014-09-13 DIAGNOSIS — S32592A Other specified fracture of left pubis, initial encounter for closed fracture: Secondary | ICD-10-CM | POA: Diagnosis not present

## 2014-09-13 DIAGNOSIS — W19XXXA Unspecified fall, initial encounter: Secondary | ICD-10-CM | POA: Diagnosis not present

## 2014-09-13 DIAGNOSIS — Z8249 Family history of ischemic heart disease and other diseases of the circulatory system: Secondary | ICD-10-CM | POA: Diagnosis not present

## 2014-09-13 DIAGNOSIS — S32509A Unspecified fracture of unspecified pubis, initial encounter for closed fracture: Secondary | ICD-10-CM | POA: Diagnosis not present

## 2014-09-13 DIAGNOSIS — R262 Difficulty in walking, not elsewhere classified: Secondary | ICD-10-CM | POA: Diagnosis not present

## 2014-09-13 DIAGNOSIS — Z79899 Other long term (current) drug therapy: Secondary | ICD-10-CM | POA: Diagnosis not present

## 2014-09-13 DIAGNOSIS — E039 Hypothyroidism, unspecified: Secondary | ICD-10-CM | POA: Diagnosis not present

## 2014-09-13 DIAGNOSIS — S32039A Unspecified fracture of third lumbar vertebra, initial encounter for closed fracture: Secondary | ICD-10-CM | POA: Diagnosis not present

## 2014-09-13 DIAGNOSIS — S32502A Unspecified fracture of left pubis, initial encounter for closed fracture: Secondary | ICD-10-CM | POA: Diagnosis not present

## 2014-09-13 DIAGNOSIS — E871 Hypo-osmolality and hyponatremia: Secondary | ICD-10-CM | POA: Diagnosis not present

## 2014-09-13 LAB — URINALYSIS, COMPLETE
Bilirubin,UR: NEGATIVE
Glucose,UR: NEGATIVE mg/dL (ref 0–75)
Ketone: NEGATIVE
Leukocyte Esterase: NEGATIVE
Nitrite: NEGATIVE
PH: 8 (ref 4.5–8.0)
PROTEIN: NEGATIVE
SPECIFIC GRAVITY: 1.008 (ref 1.003–1.030)
Squamous Epithelial: 1

## 2014-09-13 LAB — COMPREHENSIVE METABOLIC PANEL
Albumin: 3.9 g/dL (ref 3.4–5.0)
Alkaline Phosphatase: 76 U/L
Anion Gap: 5 — ABNORMAL LOW (ref 7–16)
BILIRUBIN TOTAL: 0.6 mg/dL (ref 0.2–1.0)
BUN: 16 mg/dL (ref 7–18)
CO2: 28 mmol/L (ref 21–32)
CREATININE: 0.78 mg/dL (ref 0.60–1.30)
Calcium, Total: 9 mg/dL (ref 8.5–10.1)
Chloride: 100 mmol/L (ref 98–107)
EGFR (African American): >60
GLUCOSE: 119 mg/dL — AB (ref 65–99)
Osmolality: 269 (ref 275–301)
Potassium: 3.9 mmol/L (ref 3.5–5.1)
SGOT(AST): 32 U/L (ref 15–37)
SGPT (ALT): 25 U/L
Sodium: 133 mmol/L — ABNORMAL LOW (ref 136–145)
Total Protein: 7.7 g/dL (ref 6.4–8.2)

## 2014-09-13 LAB — PROTIME-INR
INR: 1.1
PROTHROMBIN TIME: 13.7 s (ref 11.5–14.7)

## 2014-09-13 LAB — APTT: Activated PTT: 32.7 secs (ref 23.6–35.9)

## 2014-09-13 LAB — CBC WITH DIFFERENTIAL/PLATELET
Basophil #: 0.1 10*3/uL (ref 0.0–0.1)
Basophil %: 0.4 %
Eosinophil #: 0.2 10*3/uL (ref 0.0–0.7)
Eosinophil %: 1.1 %
HCT: 44.8 % (ref 35.0–47.0)
HGB: 15 g/dL (ref 12.0–16.0)
LYMPHS PCT: 8.5 %
Lymphocyte #: 1.2 10*3/uL (ref 1.0–3.6)
MCH: 31 pg (ref 26.0–34.0)
MCHC: 33.4 g/dL (ref 32.0–36.0)
MCV: 93 fL (ref 80–100)
MONO ABS: 0.6 x10 3/mm (ref 0.2–0.9)
MONOS PCT: 4.7 %
Neutrophil #: 11.5 10*3/uL — ABNORMAL HIGH (ref 1.4–6.5)
Neutrophil %: 85.3 %
PLATELETS: 192 10*3/uL (ref 150–440)
RBC: 4.83 10*6/uL (ref 3.80–5.20)
RDW: 13.8 % (ref 11.5–14.5)
WBC: 13.5 10*3/uL — AB (ref 3.6–11.0)

## 2014-09-14 ENCOUNTER — Ambulatory Visit: Payer: Self-pay | Admitting: Orthopedic Surgery

## 2014-09-14 DIAGNOSIS — S3282XA Multiple fractures of pelvis without disruption of pelvic ring, initial encounter for closed fracture: Secondary | ICD-10-CM | POA: Diagnosis not present

## 2014-09-14 DIAGNOSIS — T148 Other injury of unspecified body region: Secondary | ICD-10-CM | POA: Diagnosis not present

## 2014-09-14 DIAGNOSIS — E871 Hypo-osmolality and hyponatremia: Secondary | ICD-10-CM | POA: Diagnosis not present

## 2014-09-14 DIAGNOSIS — R6889 Other general symptoms and signs: Secondary | ICD-10-CM | POA: Diagnosis not present

## 2014-09-14 DIAGNOSIS — S32509A Unspecified fracture of unspecified pubis, initial encounter for closed fracture: Secondary | ICD-10-CM | POA: Diagnosis not present

## 2014-09-14 DIAGNOSIS — W03XXXA Other fall on same level due to collision with another person, initial encounter: Secondary | ICD-10-CM | POA: Diagnosis not present

## 2014-09-14 LAB — BASIC METABOLIC PANEL
Anion Gap: 5 — ABNORMAL LOW (ref 7–16)
BUN: 15 mg/dL (ref 7–18)
Calcium, Total: 8 mg/dL — ABNORMAL LOW (ref 8.5–10.1)
Chloride: 101 mmol/L (ref 98–107)
Co2: 28 mmol/L (ref 21–32)
Creatinine: 0.78 mg/dL (ref 0.60–1.30)
EGFR (African American): >60
EGFR (Non-African Amer.): >60
Glucose: 125 mg/dL — ABNORMAL HIGH (ref 65–99)
OSMOLALITY: 271 (ref 275–301)
POTASSIUM: 4 mmol/L (ref 3.5–5.1)
SODIUM: 134 mmol/L — AB (ref 136–145)

## 2014-09-14 LAB — CBC WITH DIFFERENTIAL/PLATELET
Basophil #: 0 10*3/uL (ref 0.0–0.1)
Basophil %: 0.4 %
Eosinophil #: 0.2 10*3/uL (ref 0.0–0.7)
Eosinophil %: 2.2 %
HCT: 41.8 % (ref 35.0–47.0)
HGB: 13.9 g/dL (ref 12.0–16.0)
LYMPHS PCT: 14.9 %
Lymphocyte #: 1.5 10*3/uL (ref 1.0–3.6)
MCH: 31 pg (ref 26.0–34.0)
MCHC: 33.3 g/dL (ref 32.0–36.0)
MCV: 93 fL (ref 80–100)
MONO ABS: 0.8 x10 3/mm (ref 0.2–0.9)
MONOS PCT: 8.3 %
Neutrophil #: 7.4 10*3/uL — ABNORMAL HIGH (ref 1.4–6.5)
Neutrophil %: 74.2 %
PLATELETS: 162 10*3/uL (ref 150–440)
RBC: 4.48 10*6/uL (ref 3.80–5.20)
RDW: 14 % (ref 11.5–14.5)
WBC: 9.9 10*3/uL (ref 3.6–11.0)

## 2014-09-15 DIAGNOSIS — R6889 Other general symptoms and signs: Secondary | ICD-10-CM | POA: Diagnosis not present

## 2014-09-15 DIAGNOSIS — S32509A Unspecified fracture of unspecified pubis, initial encounter for closed fracture: Secondary | ICD-10-CM | POA: Diagnosis not present

## 2014-09-15 DIAGNOSIS — E871 Hypo-osmolality and hyponatremia: Secondary | ICD-10-CM | POA: Diagnosis not present

## 2014-09-15 DIAGNOSIS — W19XXXA Unspecified fall, initial encounter: Secondary | ICD-10-CM | POA: Diagnosis not present

## 2014-09-15 DIAGNOSIS — T148 Other injury of unspecified body region: Secondary | ICD-10-CM | POA: Diagnosis not present

## 2014-09-15 DIAGNOSIS — R531 Weakness: Secondary | ICD-10-CM | POA: Diagnosis not present

## 2014-09-16 DIAGNOSIS — R2689 Other abnormalities of gait and mobility: Secondary | ICD-10-CM | POA: Diagnosis not present

## 2014-09-16 DIAGNOSIS — M1990 Unspecified osteoarthritis, unspecified site: Secondary | ICD-10-CM | POA: Diagnosis not present

## 2014-09-16 DIAGNOSIS — I10 Essential (primary) hypertension: Secondary | ICD-10-CM | POA: Diagnosis not present

## 2014-09-16 DIAGNOSIS — M6281 Muscle weakness (generalized): Secondary | ICD-10-CM | POA: Diagnosis not present

## 2014-09-17 DIAGNOSIS — R2689 Other abnormalities of gait and mobility: Secondary | ICD-10-CM | POA: Diagnosis not present

## 2014-09-17 DIAGNOSIS — M6281 Muscle weakness (generalized): Secondary | ICD-10-CM | POA: Diagnosis not present

## 2014-09-18 DIAGNOSIS — M6281 Muscle weakness (generalized): Secondary | ICD-10-CM | POA: Diagnosis not present

## 2014-09-18 DIAGNOSIS — R2689 Other abnormalities of gait and mobility: Secondary | ICD-10-CM | POA: Diagnosis not present

## 2014-09-19 DIAGNOSIS — R2689 Other abnormalities of gait and mobility: Secondary | ICD-10-CM | POA: Diagnosis not present

## 2014-09-19 DIAGNOSIS — M6281 Muscle weakness (generalized): Secondary | ICD-10-CM | POA: Diagnosis not present

## 2014-09-21 DIAGNOSIS — R2689 Other abnormalities of gait and mobility: Secondary | ICD-10-CM | POA: Diagnosis not present

## 2014-09-21 DIAGNOSIS — M6281 Muscle weakness (generalized): Secondary | ICD-10-CM | POA: Diagnosis not present

## 2014-09-22 DIAGNOSIS — R2689 Other abnormalities of gait and mobility: Secondary | ICD-10-CM | POA: Diagnosis not present

## 2014-09-22 DIAGNOSIS — M6281 Muscle weakness (generalized): Secondary | ICD-10-CM | POA: Diagnosis not present

## 2014-09-23 DIAGNOSIS — R2689 Other abnormalities of gait and mobility: Secondary | ICD-10-CM | POA: Diagnosis not present

## 2014-09-23 DIAGNOSIS — M6281 Muscle weakness (generalized): Secondary | ICD-10-CM | POA: Diagnosis not present

## 2014-09-24 DIAGNOSIS — M6281 Muscle weakness (generalized): Secondary | ICD-10-CM | POA: Diagnosis not present

## 2014-09-24 DIAGNOSIS — R2689 Other abnormalities of gait and mobility: Secondary | ICD-10-CM | POA: Diagnosis not present

## 2014-09-25 DIAGNOSIS — M47816 Spondylosis without myelopathy or radiculopathy, lumbar region: Secondary | ICD-10-CM | POA: Diagnosis not present

## 2014-09-25 DIAGNOSIS — M4850XA Collapsed vertebra, not elsewhere classified, site unspecified, initial encounter for fracture: Secondary | ICD-10-CM | POA: Diagnosis not present

## 2014-09-25 DIAGNOSIS — M5489 Other dorsalgia: Secondary | ICD-10-CM | POA: Diagnosis not present

## 2014-09-25 DIAGNOSIS — S32058A Other fracture of fifth lumbar vertebra, initial encounter for closed fracture: Secondary | ICD-10-CM | POA: Diagnosis not present

## 2014-09-25 DIAGNOSIS — S32111A Minimally displaced Zone I fracture of sacrum, initial encounter for closed fracture: Secondary | ICD-10-CM | POA: Diagnosis not present

## 2014-09-25 DIAGNOSIS — S3219XA Other fracture of sacrum, initial encounter for closed fracture: Secondary | ICD-10-CM | POA: Diagnosis not present

## 2014-09-25 DIAGNOSIS — M545 Low back pain: Secondary | ICD-10-CM | POA: Diagnosis not present

## 2014-09-25 DIAGNOSIS — S32008A Other fracture of unspecified lumbar vertebra, initial encounter for closed fracture: Secondary | ICD-10-CM | POA: Diagnosis not present

## 2014-09-25 DIAGNOSIS — I10 Essential (primary) hypertension: Secondary | ICD-10-CM | POA: Diagnosis not present

## 2014-09-25 DIAGNOSIS — M810 Age-related osteoporosis without current pathological fracture: Secondary | ICD-10-CM | POA: Diagnosis not present

## 2014-09-25 DIAGNOSIS — S32512A Fracture of superior rim of left pubis, initial encounter for closed fracture: Secondary | ICD-10-CM | POA: Diagnosis not present

## 2014-09-25 LAB — CBC
HCT: 44 % (ref 35.0–47.0)
HGB: 14.2 g/dL (ref 12.0–16.0)
MCH: 30.2 pg (ref 26.0–34.0)
MCHC: 32.4 g/dL (ref 32.0–36.0)
MCV: 93 fL (ref 80–100)
PLATELETS: 279 10*3/uL (ref 150–440)
RBC: 4.71 10*6/uL (ref 3.80–5.20)
RDW: 14.1 % (ref 11.5–14.5)
WBC: 10.9 10*3/uL (ref 3.6–11.0)

## 2014-09-25 LAB — COMPREHENSIVE METABOLIC PANEL
ALK PHOS: 155 U/L — AB
AST: 23 U/L (ref 15–37)
Albumin: 3.4 g/dL (ref 3.4–5.0)
Anion Gap: 6 — ABNORMAL LOW (ref 7–16)
BUN: 16 mg/dL (ref 7–18)
Bilirubin,Total: 0.7 mg/dL (ref 0.2–1.0)
Calcium, Total: 8.6 mg/dL (ref 8.5–10.1)
Chloride: 98 mmol/L (ref 98–107)
Co2: 27 mmol/L (ref 21–32)
Creatinine: 0.62 mg/dL (ref 0.60–1.30)
EGFR (African American): >60
Glucose: 101 mg/dL — ABNORMAL HIGH (ref 65–99)
Osmolality: 264 (ref 275–301)
POTASSIUM: 4.3 mmol/L (ref 3.5–5.1)
SGPT (ALT): 20 U/L
Sodium: 131 mmol/L — ABNORMAL LOW (ref 136–145)
Total Protein: 7.3 g/dL (ref 6.4–8.2)

## 2014-09-26 ENCOUNTER — Inpatient Hospital Stay: Payer: Self-pay | Admitting: Internal Medicine

## 2014-09-26 DIAGNOSIS — M5136 Other intervertebral disc degeneration, lumbar region: Secondary | ICD-10-CM | POA: Diagnosis present

## 2014-09-26 DIAGNOSIS — E039 Hypothyroidism, unspecified: Secondary | ICD-10-CM | POA: Diagnosis not present

## 2014-09-26 DIAGNOSIS — I10 Essential (primary) hypertension: Secondary | ICD-10-CM | POA: Diagnosis not present

## 2014-09-26 DIAGNOSIS — M8580 Other specified disorders of bone density and structure, unspecified site: Secondary | ICD-10-CM | POA: Diagnosis present

## 2014-09-26 DIAGNOSIS — M810 Age-related osteoporosis without current pathological fracture: Secondary | ICD-10-CM | POA: Diagnosis not present

## 2014-09-26 DIAGNOSIS — S32058A Other fracture of fifth lumbar vertebra, initial encounter for closed fracture: Secondary | ICD-10-CM | POA: Diagnosis not present

## 2014-09-26 DIAGNOSIS — R531 Weakness: Secondary | ICD-10-CM | POA: Diagnosis not present

## 2014-09-26 DIAGNOSIS — H409 Unspecified glaucoma: Secondary | ICD-10-CM | POA: Diagnosis not present

## 2014-09-26 DIAGNOSIS — Z8249 Family history of ischemic heart disease and other diseases of the circulatory system: Secondary | ICD-10-CM | POA: Diagnosis not present

## 2014-09-26 DIAGNOSIS — K269 Duodenal ulcer, unspecified as acute or chronic, without hemorrhage or perforation: Secondary | ICD-10-CM | POA: Diagnosis not present

## 2014-09-26 DIAGNOSIS — M47896 Other spondylosis, lumbar region: Secondary | ICD-10-CM | POA: Diagnosis present

## 2014-09-26 DIAGNOSIS — M4856XS Collapsed vertebra, not elsewhere classified, lumbar region, sequela of fracture: Secondary | ICD-10-CM | POA: Diagnosis present

## 2014-09-26 DIAGNOSIS — Z9181 History of falling: Secondary | ICD-10-CM | POA: Diagnosis not present

## 2014-09-26 DIAGNOSIS — M80052D Age-related osteoporosis with current pathological fracture, left femur, subsequent encounter for fracture with routine healing: Secondary | ICD-10-CM | POA: Diagnosis not present

## 2014-09-26 DIAGNOSIS — S3219XA Other fracture of sacrum, initial encounter for closed fracture: Secondary | ICD-10-CM | POA: Diagnosis not present

## 2014-09-26 DIAGNOSIS — M8088XA Other osteoporosis with current pathological fracture, vertebra(e), initial encounter for fracture: Secondary | ICD-10-CM | POA: Diagnosis not present

## 2014-09-26 DIAGNOSIS — M545 Low back pain: Secondary | ICD-10-CM | POA: Diagnosis not present

## 2014-09-26 DIAGNOSIS — M47816 Spondylosis without myelopathy or radiculopathy, lumbar region: Secondary | ICD-10-CM | POA: Diagnosis not present

## 2014-09-26 DIAGNOSIS — S32008A Other fracture of unspecified lumbar vertebra, initial encounter for closed fracture: Secondary | ICD-10-CM | POA: Diagnosis not present

## 2014-09-26 DIAGNOSIS — M5135 Other intervertebral disc degeneration, thoracolumbar region: Secondary | ICD-10-CM | POA: Diagnosis present

## 2014-09-26 DIAGNOSIS — K209 Esophagitis, unspecified: Secondary | ICD-10-CM | POA: Diagnosis not present

## 2014-09-26 DIAGNOSIS — N390 Urinary tract infection, site not specified: Secondary | ICD-10-CM | POA: Diagnosis not present

## 2014-09-26 DIAGNOSIS — S32592A Other specified fracture of left pubis, initial encounter for closed fracture: Secondary | ICD-10-CM | POA: Diagnosis not present

## 2014-09-26 DIAGNOSIS — R262 Difficulty in walking, not elsewhere classified: Secondary | ICD-10-CM | POA: Diagnosis not present

## 2014-09-26 DIAGNOSIS — M4850XA Collapsed vertebra, not elsewhere classified, site unspecified, initial encounter for fracture: Secondary | ICD-10-CM | POA: Diagnosis not present

## 2014-09-26 DIAGNOSIS — M8008XD Age-related osteoporosis with current pathological fracture, vertebra(e), subsequent encounter for fracture with routine healing: Secondary | ICD-10-CM | POA: Diagnosis not present

## 2014-09-26 DIAGNOSIS — M6281 Muscle weakness (generalized): Secondary | ICD-10-CM | POA: Diagnosis not present

## 2014-09-26 DIAGNOSIS — Z66 Do not resuscitate: Secondary | ICD-10-CM | POA: Diagnosis present

## 2014-09-26 LAB — URINALYSIS, COMPLETE
Bilirubin,UR: NEGATIVE
Blood: NEGATIVE
GLUCOSE, UR: NEGATIVE mg/dL (ref 0–75)
NITRITE: NEGATIVE
PH: 6 (ref 4.5–8.0)
Protein: NEGATIVE
RBC,UR: 1 /HPF (ref 0–5)
SPECIFIC GRAVITY: 1.012 (ref 1.003–1.030)

## 2014-09-29 DIAGNOSIS — M549 Dorsalgia, unspecified: Secondary | ICD-10-CM | POA: Diagnosis not present

## 2014-09-29 DIAGNOSIS — S32502D Unspecified fracture of left pubis, subsequent encounter for fracture with routine healing: Secondary | ICD-10-CM | POA: Diagnosis not present

## 2014-09-29 DIAGNOSIS — S32592A Other specified fracture of left pubis, initial encounter for closed fracture: Secondary | ICD-10-CM | POA: Diagnosis not present

## 2014-09-29 DIAGNOSIS — M80052D Age-related osteoporosis with current pathological fracture, left femur, subsequent encounter for fracture with routine healing: Secondary | ICD-10-CM | POA: Diagnosis not present

## 2014-09-29 DIAGNOSIS — M25552 Pain in left hip: Secondary | ICD-10-CM | POA: Diagnosis not present

## 2014-09-29 DIAGNOSIS — H44002 Unspecified purulent endophthalmitis, left eye: Secondary | ICD-10-CM | POA: Diagnosis not present

## 2014-09-29 DIAGNOSIS — K5900 Constipation, unspecified: Secondary | ICD-10-CM | POA: Diagnosis not present

## 2014-09-29 DIAGNOSIS — S32008A Other fracture of unspecified lumbar vertebra, initial encounter for closed fracture: Secondary | ICD-10-CM | POA: Diagnosis not present

## 2014-09-29 DIAGNOSIS — K269 Duodenal ulcer, unspecified as acute or chronic, without hemorrhage or perforation: Secondary | ICD-10-CM | POA: Diagnosis not present

## 2014-09-29 DIAGNOSIS — M545 Low back pain: Secondary | ICD-10-CM | POA: Diagnosis not present

## 2014-09-29 DIAGNOSIS — E039 Hypothyroidism, unspecified: Secondary | ICD-10-CM | POA: Diagnosis not present

## 2014-09-29 DIAGNOSIS — S32512A Fracture of superior rim of left pubis, initial encounter for closed fracture: Secondary | ICD-10-CM | POA: Diagnosis not present

## 2014-09-29 DIAGNOSIS — I10 Essential (primary) hypertension: Secondary | ICD-10-CM | POA: Diagnosis not present

## 2014-09-29 DIAGNOSIS — M25559 Pain in unspecified hip: Secondary | ICD-10-CM | POA: Diagnosis not present

## 2014-09-29 DIAGNOSIS — S3210XA Unspecified fracture of sacrum, initial encounter for closed fracture: Secondary | ICD-10-CM | POA: Diagnosis not present

## 2014-09-29 DIAGNOSIS — H409 Unspecified glaucoma: Secondary | ICD-10-CM | POA: Diagnosis not present

## 2014-09-29 DIAGNOSIS — R531 Weakness: Secondary | ICD-10-CM | POA: Diagnosis not present

## 2014-09-29 DIAGNOSIS — M8008XD Age-related osteoporosis with current pathological fracture, vertebra(e), subsequent encounter for fracture with routine healing: Secondary | ICD-10-CM | POA: Diagnosis not present

## 2014-09-29 DIAGNOSIS — R262 Difficulty in walking, not elsewhere classified: Secondary | ICD-10-CM | POA: Diagnosis not present

## 2014-09-29 DIAGNOSIS — H10501 Unspecified blepharoconjunctivitis, right eye: Secondary | ICD-10-CM | POA: Diagnosis not present

## 2014-09-29 DIAGNOSIS — M6281 Muscle weakness (generalized): Secondary | ICD-10-CM | POA: Diagnosis not present

## 2014-09-29 DIAGNOSIS — K209 Esophagitis, unspecified: Secondary | ICD-10-CM | POA: Diagnosis not present

## 2014-09-29 DIAGNOSIS — M810 Age-related osteoporosis without current pathological fracture: Secondary | ICD-10-CM | POA: Diagnosis not present

## 2014-09-29 DIAGNOSIS — Z9181 History of falling: Secondary | ICD-10-CM | POA: Diagnosis not present

## 2014-09-29 DIAGNOSIS — S32110D Nondisplaced Zone I fracture of sacrum, subsequent encounter for fracture with routine healing: Secondary | ICD-10-CM | POA: Diagnosis not present

## 2014-09-29 LAB — CREATININE, SERUM
Creatinine: 0.63 mg/dL (ref 0.60–1.30)
EGFR (African American): >60

## 2014-09-30 ENCOUNTER — Encounter: Payer: Self-pay | Admitting: Internal Medicine

## 2014-09-30 DIAGNOSIS — M810 Age-related osteoporosis without current pathological fracture: Secondary | ICD-10-CM | POA: Diagnosis not present

## 2014-09-30 DIAGNOSIS — K5900 Constipation, unspecified: Secondary | ICD-10-CM | POA: Diagnosis not present

## 2014-09-30 DIAGNOSIS — M549 Dorsalgia, unspecified: Secondary | ICD-10-CM | POA: Diagnosis not present

## 2014-10-12 ENCOUNTER — Encounter: Payer: Self-pay | Admitting: Internal Medicine

## 2014-10-14 ENCOUNTER — Ambulatory Visit: Payer: Medicare Other | Admitting: Podiatry

## 2014-10-14 DIAGNOSIS — S32592A Other specified fracture of left pubis, initial encounter for closed fracture: Secondary | ICD-10-CM | POA: Diagnosis not present

## 2014-10-14 DIAGNOSIS — S32512A Fracture of superior rim of left pubis, initial encounter for closed fracture: Secondary | ICD-10-CM | POA: Diagnosis not present

## 2014-10-14 DIAGNOSIS — M25552 Pain in left hip: Secondary | ICD-10-CM | POA: Diagnosis not present

## 2014-10-14 DIAGNOSIS — S3210XA Unspecified fracture of sacrum, initial encounter for closed fracture: Secondary | ICD-10-CM | POA: Diagnosis not present

## 2014-11-11 ENCOUNTER — Encounter: Payer: Self-pay | Admitting: Internal Medicine

## 2014-11-13 DIAGNOSIS — S32502D Unspecified fracture of left pubis, subsequent encounter for fracture with routine healing: Secondary | ICD-10-CM | POA: Diagnosis not present

## 2014-11-13 DIAGNOSIS — S32110D Nondisplaced Zone I fracture of sacrum, subsequent encounter for fracture with routine healing: Secondary | ICD-10-CM | POA: Diagnosis not present

## 2014-11-13 DIAGNOSIS — M25559 Pain in unspecified hip: Secondary | ICD-10-CM | POA: Diagnosis not present

## 2014-11-14 DIAGNOSIS — H10501 Unspecified blepharoconjunctivitis, right eye: Secondary | ICD-10-CM | POA: Diagnosis not present

## 2014-11-17 DIAGNOSIS — R269 Unspecified abnormalities of gait and mobility: Secondary | ICD-10-CM | POA: Diagnosis not present

## 2014-11-17 DIAGNOSIS — R279 Unspecified lack of coordination: Secondary | ICD-10-CM | POA: Diagnosis not present

## 2014-11-17 DIAGNOSIS — M6281 Muscle weakness (generalized): Secondary | ICD-10-CM | POA: Diagnosis not present

## 2014-11-17 DIAGNOSIS — R2681 Unsteadiness on feet: Secondary | ICD-10-CM | POA: Diagnosis not present

## 2014-11-18 DIAGNOSIS — R2681 Unsteadiness on feet: Secondary | ICD-10-CM | POA: Diagnosis not present

## 2014-11-18 DIAGNOSIS — M6281 Muscle weakness (generalized): Secondary | ICD-10-CM | POA: Diagnosis not present

## 2014-11-18 DIAGNOSIS — R269 Unspecified abnormalities of gait and mobility: Secondary | ICD-10-CM | POA: Diagnosis not present

## 2014-11-18 DIAGNOSIS — R279 Unspecified lack of coordination: Secondary | ICD-10-CM | POA: Diagnosis not present

## 2014-11-19 DIAGNOSIS — R2681 Unsteadiness on feet: Secondary | ICD-10-CM | POA: Diagnosis not present

## 2014-11-19 DIAGNOSIS — R279 Unspecified lack of coordination: Secondary | ICD-10-CM | POA: Diagnosis not present

## 2014-11-19 DIAGNOSIS — M6281 Muscle weakness (generalized): Secondary | ICD-10-CM | POA: Diagnosis not present

## 2014-11-19 DIAGNOSIS — R269 Unspecified abnormalities of gait and mobility: Secondary | ICD-10-CM | POA: Diagnosis not present

## 2014-11-20 DIAGNOSIS — R279 Unspecified lack of coordination: Secondary | ICD-10-CM | POA: Diagnosis not present

## 2014-11-20 DIAGNOSIS — M6281 Muscle weakness (generalized): Secondary | ICD-10-CM | POA: Diagnosis not present

## 2014-11-20 DIAGNOSIS — R269 Unspecified abnormalities of gait and mobility: Secondary | ICD-10-CM | POA: Diagnosis not present

## 2014-11-20 DIAGNOSIS — R2681 Unsteadiness on feet: Secondary | ICD-10-CM | POA: Diagnosis not present

## 2014-11-21 DIAGNOSIS — H10501 Unspecified blepharoconjunctivitis, right eye: Secondary | ICD-10-CM | POA: Diagnosis not present

## 2014-11-24 DIAGNOSIS — R279 Unspecified lack of coordination: Secondary | ICD-10-CM | POA: Diagnosis not present

## 2014-11-24 DIAGNOSIS — M6281 Muscle weakness (generalized): Secondary | ICD-10-CM | POA: Diagnosis not present

## 2014-11-24 DIAGNOSIS — R2681 Unsteadiness on feet: Secondary | ICD-10-CM | POA: Diagnosis not present

## 2014-11-24 DIAGNOSIS — R269 Unspecified abnormalities of gait and mobility: Secondary | ICD-10-CM | POA: Diagnosis not present

## 2014-11-26 DIAGNOSIS — R2681 Unsteadiness on feet: Secondary | ICD-10-CM | POA: Diagnosis not present

## 2014-11-26 DIAGNOSIS — R279 Unspecified lack of coordination: Secondary | ICD-10-CM | POA: Diagnosis not present

## 2014-11-26 DIAGNOSIS — R269 Unspecified abnormalities of gait and mobility: Secondary | ICD-10-CM | POA: Diagnosis not present

## 2014-11-26 DIAGNOSIS — M6281 Muscle weakness (generalized): Secondary | ICD-10-CM | POA: Diagnosis not present

## 2014-11-28 DIAGNOSIS — R279 Unspecified lack of coordination: Secondary | ICD-10-CM | POA: Diagnosis not present

## 2014-11-28 DIAGNOSIS — R2681 Unsteadiness on feet: Secondary | ICD-10-CM | POA: Diagnosis not present

## 2014-11-28 DIAGNOSIS — R269 Unspecified abnormalities of gait and mobility: Secondary | ICD-10-CM | POA: Diagnosis not present

## 2014-11-28 DIAGNOSIS — M6281 Muscle weakness (generalized): Secondary | ICD-10-CM | POA: Diagnosis not present

## 2014-12-01 DIAGNOSIS — M6281 Muscle weakness (generalized): Secondary | ICD-10-CM | POA: Diagnosis not present

## 2014-12-01 DIAGNOSIS — H10501 Unspecified blepharoconjunctivitis, right eye: Secondary | ICD-10-CM | POA: Diagnosis not present

## 2014-12-01 DIAGNOSIS — R2681 Unsteadiness on feet: Secondary | ICD-10-CM | POA: Diagnosis not present

## 2014-12-01 DIAGNOSIS — R279 Unspecified lack of coordination: Secondary | ICD-10-CM | POA: Diagnosis not present

## 2014-12-01 DIAGNOSIS — R269 Unspecified abnormalities of gait and mobility: Secondary | ICD-10-CM | POA: Diagnosis not present

## 2014-12-02 DIAGNOSIS — R279 Unspecified lack of coordination: Secondary | ICD-10-CM | POA: Diagnosis not present

## 2014-12-02 DIAGNOSIS — M6281 Muscle weakness (generalized): Secondary | ICD-10-CM | POA: Diagnosis not present

## 2014-12-02 DIAGNOSIS — R2681 Unsteadiness on feet: Secondary | ICD-10-CM | POA: Diagnosis not present

## 2014-12-02 DIAGNOSIS — R269 Unspecified abnormalities of gait and mobility: Secondary | ICD-10-CM | POA: Diagnosis not present

## 2014-12-08 DIAGNOSIS — R279 Unspecified lack of coordination: Secondary | ICD-10-CM | POA: Diagnosis not present

## 2014-12-08 DIAGNOSIS — M6281 Muscle weakness (generalized): Secondary | ICD-10-CM | POA: Diagnosis not present

## 2014-12-08 DIAGNOSIS — R2681 Unsteadiness on feet: Secondary | ICD-10-CM | POA: Diagnosis not present

## 2014-12-08 DIAGNOSIS — R269 Unspecified abnormalities of gait and mobility: Secondary | ICD-10-CM | POA: Diagnosis not present

## 2014-12-10 DIAGNOSIS — R269 Unspecified abnormalities of gait and mobility: Secondary | ICD-10-CM | POA: Diagnosis not present

## 2014-12-10 DIAGNOSIS — M6281 Muscle weakness (generalized): Secondary | ICD-10-CM | POA: Diagnosis not present

## 2014-12-10 DIAGNOSIS — R2681 Unsteadiness on feet: Secondary | ICD-10-CM | POA: Diagnosis not present

## 2014-12-10 DIAGNOSIS — R279 Unspecified lack of coordination: Secondary | ICD-10-CM | POA: Diagnosis not present

## 2014-12-12 ENCOUNTER — Encounter: Payer: Self-pay | Admitting: Internal Medicine

## 2014-12-12 DIAGNOSIS — M6281 Muscle weakness (generalized): Secondary | ICD-10-CM | POA: Diagnosis not present

## 2014-12-12 DIAGNOSIS — R279 Unspecified lack of coordination: Secondary | ICD-10-CM | POA: Diagnosis not present

## 2014-12-12 DIAGNOSIS — R2681 Unsteadiness on feet: Secondary | ICD-10-CM | POA: Diagnosis not present

## 2014-12-12 DIAGNOSIS — R269 Unspecified abnormalities of gait and mobility: Secondary | ICD-10-CM | POA: Diagnosis not present

## 2014-12-15 DIAGNOSIS — M6281 Muscle weakness (generalized): Secondary | ICD-10-CM | POA: Diagnosis not present

## 2014-12-15 DIAGNOSIS — R2681 Unsteadiness on feet: Secondary | ICD-10-CM | POA: Diagnosis not present

## 2014-12-15 DIAGNOSIS — R279 Unspecified lack of coordination: Secondary | ICD-10-CM | POA: Diagnosis not present

## 2014-12-15 DIAGNOSIS — R269 Unspecified abnormalities of gait and mobility: Secondary | ICD-10-CM | POA: Diagnosis not present

## 2014-12-17 DIAGNOSIS — R2681 Unsteadiness on feet: Secondary | ICD-10-CM | POA: Diagnosis not present

## 2014-12-17 DIAGNOSIS — R269 Unspecified abnormalities of gait and mobility: Secondary | ICD-10-CM | POA: Diagnosis not present

## 2014-12-17 DIAGNOSIS — M6281 Muscle weakness (generalized): Secondary | ICD-10-CM | POA: Diagnosis not present

## 2014-12-17 DIAGNOSIS — R279 Unspecified lack of coordination: Secondary | ICD-10-CM | POA: Diagnosis not present

## 2014-12-19 DIAGNOSIS — R279 Unspecified lack of coordination: Secondary | ICD-10-CM | POA: Diagnosis not present

## 2014-12-19 DIAGNOSIS — R269 Unspecified abnormalities of gait and mobility: Secondary | ICD-10-CM | POA: Diagnosis not present

## 2014-12-19 DIAGNOSIS — M6281 Muscle weakness (generalized): Secondary | ICD-10-CM | POA: Diagnosis not present

## 2014-12-19 DIAGNOSIS — R2681 Unsteadiness on feet: Secondary | ICD-10-CM | POA: Diagnosis not present

## 2014-12-23 DIAGNOSIS — M6281 Muscle weakness (generalized): Secondary | ICD-10-CM | POA: Diagnosis not present

## 2014-12-23 DIAGNOSIS — R279 Unspecified lack of coordination: Secondary | ICD-10-CM | POA: Diagnosis not present

## 2014-12-23 DIAGNOSIS — R2681 Unsteadiness on feet: Secondary | ICD-10-CM | POA: Diagnosis not present

## 2014-12-23 DIAGNOSIS — R269 Unspecified abnormalities of gait and mobility: Secondary | ICD-10-CM | POA: Diagnosis not present

## 2014-12-24 DIAGNOSIS — R2681 Unsteadiness on feet: Secondary | ICD-10-CM | POA: Diagnosis not present

## 2014-12-24 DIAGNOSIS — M6281 Muscle weakness (generalized): Secondary | ICD-10-CM | POA: Diagnosis not present

## 2014-12-24 DIAGNOSIS — R269 Unspecified abnormalities of gait and mobility: Secondary | ICD-10-CM | POA: Diagnosis not present

## 2014-12-24 DIAGNOSIS — R279 Unspecified lack of coordination: Secondary | ICD-10-CM | POA: Diagnosis not present

## 2014-12-25 DIAGNOSIS — R2681 Unsteadiness on feet: Secondary | ICD-10-CM | POA: Diagnosis not present

## 2014-12-25 DIAGNOSIS — R269 Unspecified abnormalities of gait and mobility: Secondary | ICD-10-CM | POA: Diagnosis not present

## 2014-12-25 DIAGNOSIS — M6281 Muscle weakness (generalized): Secondary | ICD-10-CM | POA: Diagnosis not present

## 2014-12-25 DIAGNOSIS — R279 Unspecified lack of coordination: Secondary | ICD-10-CM | POA: Diagnosis not present

## 2014-12-26 DIAGNOSIS — M6281 Muscle weakness (generalized): Secondary | ICD-10-CM | POA: Diagnosis not present

## 2014-12-26 DIAGNOSIS — R2681 Unsteadiness on feet: Secondary | ICD-10-CM | POA: Diagnosis not present

## 2014-12-26 DIAGNOSIS — R269 Unspecified abnormalities of gait and mobility: Secondary | ICD-10-CM | POA: Diagnosis not present

## 2014-12-26 DIAGNOSIS — R279 Unspecified lack of coordination: Secondary | ICD-10-CM | POA: Diagnosis not present

## 2014-12-29 DIAGNOSIS — R2681 Unsteadiness on feet: Secondary | ICD-10-CM | POA: Diagnosis not present

## 2014-12-29 DIAGNOSIS — R269 Unspecified abnormalities of gait and mobility: Secondary | ICD-10-CM | POA: Diagnosis not present

## 2014-12-29 DIAGNOSIS — R279 Unspecified lack of coordination: Secondary | ICD-10-CM | POA: Diagnosis not present

## 2014-12-29 DIAGNOSIS — M6281 Muscle weakness (generalized): Secondary | ICD-10-CM | POA: Diagnosis not present

## 2014-12-30 DIAGNOSIS — R279 Unspecified lack of coordination: Secondary | ICD-10-CM | POA: Diagnosis not present

## 2014-12-30 DIAGNOSIS — R269 Unspecified abnormalities of gait and mobility: Secondary | ICD-10-CM | POA: Diagnosis not present

## 2014-12-30 DIAGNOSIS — R2681 Unsteadiness on feet: Secondary | ICD-10-CM | POA: Diagnosis not present

## 2014-12-30 DIAGNOSIS — M6281 Muscle weakness (generalized): Secondary | ICD-10-CM | POA: Diagnosis not present

## 2014-12-31 DIAGNOSIS — M6281 Muscle weakness (generalized): Secondary | ICD-10-CM | POA: Diagnosis not present

## 2014-12-31 DIAGNOSIS — R2681 Unsteadiness on feet: Secondary | ICD-10-CM | POA: Diagnosis not present

## 2014-12-31 DIAGNOSIS — R269 Unspecified abnormalities of gait and mobility: Secondary | ICD-10-CM | POA: Diagnosis not present

## 2014-12-31 DIAGNOSIS — R279 Unspecified lack of coordination: Secondary | ICD-10-CM | POA: Diagnosis not present

## 2015-01-01 DIAGNOSIS — R269 Unspecified abnormalities of gait and mobility: Secondary | ICD-10-CM | POA: Diagnosis not present

## 2015-01-01 DIAGNOSIS — M6281 Muscle weakness (generalized): Secondary | ICD-10-CM | POA: Diagnosis not present

## 2015-01-01 DIAGNOSIS — R279 Unspecified lack of coordination: Secondary | ICD-10-CM | POA: Diagnosis not present

## 2015-01-01 DIAGNOSIS — R2681 Unsteadiness on feet: Secondary | ICD-10-CM | POA: Diagnosis not present

## 2015-01-05 DIAGNOSIS — R269 Unspecified abnormalities of gait and mobility: Secondary | ICD-10-CM | POA: Diagnosis not present

## 2015-01-05 DIAGNOSIS — R2681 Unsteadiness on feet: Secondary | ICD-10-CM | POA: Diagnosis not present

## 2015-01-05 DIAGNOSIS — M6281 Muscle weakness (generalized): Secondary | ICD-10-CM | POA: Diagnosis not present

## 2015-01-05 DIAGNOSIS — R279 Unspecified lack of coordination: Secondary | ICD-10-CM | POA: Diagnosis not present

## 2015-01-06 DIAGNOSIS — M6281 Muscle weakness (generalized): Secondary | ICD-10-CM | POA: Diagnosis not present

## 2015-01-06 DIAGNOSIS — R279 Unspecified lack of coordination: Secondary | ICD-10-CM | POA: Diagnosis not present

## 2015-01-06 DIAGNOSIS — R2681 Unsteadiness on feet: Secondary | ICD-10-CM | POA: Diagnosis not present

## 2015-01-06 DIAGNOSIS — R269 Unspecified abnormalities of gait and mobility: Secondary | ICD-10-CM | POA: Diagnosis not present

## 2015-01-07 DIAGNOSIS — R279 Unspecified lack of coordination: Secondary | ICD-10-CM | POA: Diagnosis not present

## 2015-01-07 DIAGNOSIS — M6281 Muscle weakness (generalized): Secondary | ICD-10-CM | POA: Diagnosis not present

## 2015-01-07 DIAGNOSIS — R269 Unspecified abnormalities of gait and mobility: Secondary | ICD-10-CM | POA: Diagnosis not present

## 2015-01-07 DIAGNOSIS — R2681 Unsteadiness on feet: Secondary | ICD-10-CM | POA: Diagnosis not present

## 2015-01-09 DIAGNOSIS — R279 Unspecified lack of coordination: Secondary | ICD-10-CM | POA: Diagnosis not present

## 2015-01-09 DIAGNOSIS — R269 Unspecified abnormalities of gait and mobility: Secondary | ICD-10-CM | POA: Diagnosis not present

## 2015-01-09 DIAGNOSIS — M6281 Muscle weakness (generalized): Secondary | ICD-10-CM | POA: Diagnosis not present

## 2015-01-09 DIAGNOSIS — R2681 Unsteadiness on feet: Secondary | ICD-10-CM | POA: Diagnosis not present

## 2015-01-12 DIAGNOSIS — R269 Unspecified abnormalities of gait and mobility: Secondary | ICD-10-CM | POA: Diagnosis not present

## 2015-01-12 DIAGNOSIS — R279 Unspecified lack of coordination: Secondary | ICD-10-CM | POA: Diagnosis not present

## 2015-01-12 DIAGNOSIS — R2681 Unsteadiness on feet: Secondary | ICD-10-CM | POA: Diagnosis not present

## 2015-01-12 DIAGNOSIS — M6281 Muscle weakness (generalized): Secondary | ICD-10-CM | POA: Diagnosis not present

## 2015-01-14 DIAGNOSIS — R2681 Unsteadiness on feet: Secondary | ICD-10-CM | POA: Diagnosis not present

## 2015-01-14 DIAGNOSIS — R279 Unspecified lack of coordination: Secondary | ICD-10-CM | POA: Diagnosis not present

## 2015-01-14 DIAGNOSIS — M6281 Muscle weakness (generalized): Secondary | ICD-10-CM | POA: Diagnosis not present

## 2015-01-14 DIAGNOSIS — R269 Unspecified abnormalities of gait and mobility: Secondary | ICD-10-CM | POA: Diagnosis not present

## 2015-01-16 DIAGNOSIS — R269 Unspecified abnormalities of gait and mobility: Secondary | ICD-10-CM | POA: Diagnosis not present

## 2015-01-16 DIAGNOSIS — M6281 Muscle weakness (generalized): Secondary | ICD-10-CM | POA: Diagnosis not present

## 2015-01-16 DIAGNOSIS — R2681 Unsteadiness on feet: Secondary | ICD-10-CM | POA: Diagnosis not present

## 2015-01-16 DIAGNOSIS — R279 Unspecified lack of coordination: Secondary | ICD-10-CM | POA: Diagnosis not present

## 2015-01-19 DIAGNOSIS — R2681 Unsteadiness on feet: Secondary | ICD-10-CM | POA: Diagnosis not present

## 2015-01-19 DIAGNOSIS — R269 Unspecified abnormalities of gait and mobility: Secondary | ICD-10-CM | POA: Diagnosis not present

## 2015-01-19 DIAGNOSIS — M6281 Muscle weakness (generalized): Secondary | ICD-10-CM | POA: Diagnosis not present

## 2015-01-19 DIAGNOSIS — R279 Unspecified lack of coordination: Secondary | ICD-10-CM | POA: Diagnosis not present

## 2015-01-20 ENCOUNTER — Ambulatory Visit: Payer: Medicare Other | Admitting: Podiatry

## 2015-01-20 ENCOUNTER — Ambulatory Visit (INDEPENDENT_AMBULATORY_CARE_PROVIDER_SITE_OTHER): Payer: Medicare Other | Admitting: Podiatry

## 2015-01-20 DIAGNOSIS — B351 Tinea unguium: Secondary | ICD-10-CM

## 2015-01-20 DIAGNOSIS — H16202 Unspecified keratoconjunctivitis, left eye: Secondary | ICD-10-CM | POA: Diagnosis not present

## 2015-01-20 DIAGNOSIS — M79673 Pain in unspecified foot: Secondary | ICD-10-CM | POA: Diagnosis not present

## 2015-01-20 NOTE — Progress Notes (Signed)
° °  Subjective:    Patient ID: [name], female    DOB: 16-Dec-1913, 81 y.o.   MRN: [id number]  HPI Comments: i need my toenails trimmed. My nails are long and are poking me. They do not hurt. i can not trim my toenails. i try to trim my nails.     Review of Systems  Musculoskeletal:       Difficulty walking  Skin:       Change in nails  Neurological: Positive for tremors.  Hematological: Bruises/bleeds easily.  All other systems reviewed and are negative.      Objective:   Physical Exam  Patient is awake, alert, and oriented x 3.  In no acute distress.  Vascular status is intact with palpable pedal pulses at 1/4 DP and PT bilateral and capillary refill time within normal limits. Neurological sensation is also intact bilaterally via Semmes Weinstein monofilament at 5/5 sites. Light touch, vibratory sensation, Achilles tendon reflex is intact. Dermatological exam reveals skin color, turger and texture as normal. No open lesions present.  Musculature intact with dorsiflexion, plantarflexion, inversion, eversion.  Patient's toenails are elongated, thickened, discolored, dystrophic and clinically mycotic 1 through 5 bilateral. They're painful with ambulation and in shoe gear.     Assessment & Plan:  Symptomatic mycotic toenails x10  Plan: Debridement of the toenails was carried out today without complication. She will be seen back in 3 months or as needed for followup in the future.

## 2015-01-20 NOTE — Progress Notes (Signed)
Subjective:     Patient ID: [name], female   DOB: 08-Dec-1914, 81 y.o.   MRN: [id number]  HPI patient presents with thick yellow brittle nailbeds 1-5 of both feet that are painful when pressed   Review of Systems     Objective:   Physical Exam  Neurovascular status intact with thick yellow brittle nailbeds 1-5 both feet that are painful    Assessment:     Mycotic nail infections with pain 1-5 both feet    Plan:     Debride painful nailbeds 1-5 both feet with no iatrogenic bleeding noted

## 2015-01-21 DIAGNOSIS — R279 Unspecified lack of coordination: Secondary | ICD-10-CM | POA: Diagnosis not present

## 2015-01-21 DIAGNOSIS — R269 Unspecified abnormalities of gait and mobility: Secondary | ICD-10-CM | POA: Diagnosis not present

## 2015-01-21 DIAGNOSIS — M6281 Muscle weakness (generalized): Secondary | ICD-10-CM | POA: Diagnosis not present

## 2015-01-21 DIAGNOSIS — R2681 Unsteadiness on feet: Secondary | ICD-10-CM | POA: Diagnosis not present

## 2015-01-22 DIAGNOSIS — R279 Unspecified lack of coordination: Secondary | ICD-10-CM | POA: Diagnosis not present

## 2015-01-22 DIAGNOSIS — R269 Unspecified abnormalities of gait and mobility: Secondary | ICD-10-CM | POA: Diagnosis not present

## 2015-01-22 DIAGNOSIS — M6281 Muscle weakness (generalized): Secondary | ICD-10-CM | POA: Diagnosis not present

## 2015-01-22 DIAGNOSIS — R2681 Unsteadiness on feet: Secondary | ICD-10-CM | POA: Diagnosis not present

## 2015-01-23 DIAGNOSIS — M6281 Muscle weakness (generalized): Secondary | ICD-10-CM | POA: Diagnosis not present

## 2015-01-23 DIAGNOSIS — R279 Unspecified lack of coordination: Secondary | ICD-10-CM | POA: Diagnosis not present

## 2015-01-23 DIAGNOSIS — R269 Unspecified abnormalities of gait and mobility: Secondary | ICD-10-CM | POA: Diagnosis not present

## 2015-01-23 DIAGNOSIS — R2681 Unsteadiness on feet: Secondary | ICD-10-CM | POA: Diagnosis not present

## 2015-01-27 DIAGNOSIS — R2681 Unsteadiness on feet: Secondary | ICD-10-CM | POA: Diagnosis not present

## 2015-01-27 DIAGNOSIS — R279 Unspecified lack of coordination: Secondary | ICD-10-CM | POA: Diagnosis not present

## 2015-01-27 DIAGNOSIS — M6281 Muscle weakness (generalized): Secondary | ICD-10-CM | POA: Diagnosis not present

## 2015-01-27 DIAGNOSIS — R269 Unspecified abnormalities of gait and mobility: Secondary | ICD-10-CM | POA: Diagnosis not present

## 2015-01-28 DIAGNOSIS — R269 Unspecified abnormalities of gait and mobility: Secondary | ICD-10-CM | POA: Diagnosis not present

## 2015-01-28 DIAGNOSIS — R279 Unspecified lack of coordination: Secondary | ICD-10-CM | POA: Diagnosis not present

## 2015-01-28 DIAGNOSIS — H16202 Unspecified keratoconjunctivitis, left eye: Secondary | ICD-10-CM | POA: Diagnosis not present

## 2015-01-28 DIAGNOSIS — R2681 Unsteadiness on feet: Secondary | ICD-10-CM | POA: Diagnosis not present

## 2015-01-28 DIAGNOSIS — M6281 Muscle weakness (generalized): Secondary | ICD-10-CM | POA: Diagnosis not present

## 2015-01-30 DIAGNOSIS — M6281 Muscle weakness (generalized): Secondary | ICD-10-CM | POA: Diagnosis not present

## 2015-01-30 DIAGNOSIS — R269 Unspecified abnormalities of gait and mobility: Secondary | ICD-10-CM | POA: Diagnosis not present

## 2015-01-30 DIAGNOSIS — R279 Unspecified lack of coordination: Secondary | ICD-10-CM | POA: Diagnosis not present

## 2015-01-30 DIAGNOSIS — R2681 Unsteadiness on feet: Secondary | ICD-10-CM | POA: Diagnosis not present

## 2015-02-02 DIAGNOSIS — R269 Unspecified abnormalities of gait and mobility: Secondary | ICD-10-CM | POA: Diagnosis not present

## 2015-02-02 DIAGNOSIS — R2681 Unsteadiness on feet: Secondary | ICD-10-CM | POA: Diagnosis not present

## 2015-02-02 DIAGNOSIS — R279 Unspecified lack of coordination: Secondary | ICD-10-CM | POA: Diagnosis not present

## 2015-02-02 DIAGNOSIS — M6281 Muscle weakness (generalized): Secondary | ICD-10-CM | POA: Diagnosis not present

## 2015-02-03 DIAGNOSIS — M6281 Muscle weakness (generalized): Secondary | ICD-10-CM | POA: Diagnosis not present

## 2015-02-03 DIAGNOSIS — R2681 Unsteadiness on feet: Secondary | ICD-10-CM | POA: Diagnosis not present

## 2015-02-03 DIAGNOSIS — R269 Unspecified abnormalities of gait and mobility: Secondary | ICD-10-CM | POA: Diagnosis not present

## 2015-02-03 DIAGNOSIS — R279 Unspecified lack of coordination: Secondary | ICD-10-CM | POA: Diagnosis not present

## 2015-02-06 ENCOUNTER — Other Ambulatory Visit: Payer: Self-pay | Admitting: Ophthalmology

## 2015-02-06 DIAGNOSIS — M6281 Muscle weakness (generalized): Secondary | ICD-10-CM | POA: Diagnosis not present

## 2015-02-06 DIAGNOSIS — R279 Unspecified lack of coordination: Secondary | ICD-10-CM | POA: Diagnosis not present

## 2015-02-06 DIAGNOSIS — H16202 Unspecified keratoconjunctivitis, left eye: Secondary | ICD-10-CM | POA: Diagnosis not present

## 2015-02-06 DIAGNOSIS — R2681 Unsteadiness on feet: Secondary | ICD-10-CM | POA: Diagnosis not present

## 2015-02-06 DIAGNOSIS — R269 Unspecified abnormalities of gait and mobility: Secondary | ICD-10-CM | POA: Diagnosis not present

## 2015-02-09 DIAGNOSIS — R279 Unspecified lack of coordination: Secondary | ICD-10-CM | POA: Diagnosis not present

## 2015-02-09 DIAGNOSIS — R2681 Unsteadiness on feet: Secondary | ICD-10-CM | POA: Diagnosis not present

## 2015-02-09 DIAGNOSIS — R269 Unspecified abnormalities of gait and mobility: Secondary | ICD-10-CM | POA: Diagnosis not present

## 2015-02-09 DIAGNOSIS — M6281 Muscle weakness (generalized): Secondary | ICD-10-CM | POA: Diagnosis not present

## 2015-02-10 DIAGNOSIS — M6281 Muscle weakness (generalized): Secondary | ICD-10-CM | POA: Diagnosis not present

## 2015-02-10 DIAGNOSIS — R269 Unspecified abnormalities of gait and mobility: Secondary | ICD-10-CM | POA: Diagnosis not present

## 2015-02-10 DIAGNOSIS — R2681 Unsteadiness on feet: Secondary | ICD-10-CM | POA: Diagnosis not present

## 2015-02-10 DIAGNOSIS — R279 Unspecified lack of coordination: Secondary | ICD-10-CM | POA: Diagnosis not present

## 2015-02-11 DIAGNOSIS — M6281 Muscle weakness (generalized): Secondary | ICD-10-CM | POA: Diagnosis not present

## 2015-02-11 DIAGNOSIS — R2681 Unsteadiness on feet: Secondary | ICD-10-CM | POA: Diagnosis not present

## 2015-02-11 DIAGNOSIS — R269 Unspecified abnormalities of gait and mobility: Secondary | ICD-10-CM | POA: Diagnosis not present

## 2015-02-11 DIAGNOSIS — R279 Unspecified lack of coordination: Secondary | ICD-10-CM | POA: Diagnosis not present

## 2015-02-12 DIAGNOSIS — R2681 Unsteadiness on feet: Secondary | ICD-10-CM | POA: Diagnosis not present

## 2015-02-12 DIAGNOSIS — M6281 Muscle weakness (generalized): Secondary | ICD-10-CM | POA: Diagnosis not present

## 2015-02-12 DIAGNOSIS — R269 Unspecified abnormalities of gait and mobility: Secondary | ICD-10-CM | POA: Diagnosis not present

## 2015-02-12 DIAGNOSIS — R279 Unspecified lack of coordination: Secondary | ICD-10-CM | POA: Diagnosis not present

## 2015-02-13 DIAGNOSIS — H10501 Unspecified blepharoconjunctivitis, right eye: Secondary | ICD-10-CM | POA: Diagnosis not present

## 2015-02-16 DIAGNOSIS — R2681 Unsteadiness on feet: Secondary | ICD-10-CM | POA: Diagnosis not present

## 2015-02-16 DIAGNOSIS — M6281 Muscle weakness (generalized): Secondary | ICD-10-CM | POA: Diagnosis not present

## 2015-02-16 DIAGNOSIS — R269 Unspecified abnormalities of gait and mobility: Secondary | ICD-10-CM | POA: Diagnosis not present

## 2015-02-16 DIAGNOSIS — R279 Unspecified lack of coordination: Secondary | ICD-10-CM | POA: Diagnosis not present

## 2015-02-17 DIAGNOSIS — R269 Unspecified abnormalities of gait and mobility: Secondary | ICD-10-CM | POA: Diagnosis not present

## 2015-02-17 DIAGNOSIS — M6281 Muscle weakness (generalized): Secondary | ICD-10-CM | POA: Diagnosis not present

## 2015-02-17 DIAGNOSIS — R279 Unspecified lack of coordination: Secondary | ICD-10-CM | POA: Diagnosis not present

## 2015-02-17 DIAGNOSIS — R2681 Unsteadiness on feet: Secondary | ICD-10-CM | POA: Diagnosis not present

## 2015-02-18 DIAGNOSIS — M6281 Muscle weakness (generalized): Secondary | ICD-10-CM | POA: Diagnosis not present

## 2015-02-18 DIAGNOSIS — R279 Unspecified lack of coordination: Secondary | ICD-10-CM | POA: Diagnosis not present

## 2015-02-18 DIAGNOSIS — R2681 Unsteadiness on feet: Secondary | ICD-10-CM | POA: Diagnosis not present

## 2015-02-18 DIAGNOSIS — R269 Unspecified abnormalities of gait and mobility: Secondary | ICD-10-CM | POA: Diagnosis not present

## 2015-02-19 DIAGNOSIS — M6281 Muscle weakness (generalized): Secondary | ICD-10-CM | POA: Diagnosis not present

## 2015-02-19 DIAGNOSIS — R2681 Unsteadiness on feet: Secondary | ICD-10-CM | POA: Diagnosis not present

## 2015-02-19 DIAGNOSIS — R279 Unspecified lack of coordination: Secondary | ICD-10-CM | POA: Diagnosis not present

## 2015-02-19 DIAGNOSIS — R269 Unspecified abnormalities of gait and mobility: Secondary | ICD-10-CM | POA: Diagnosis not present

## 2015-02-20 DIAGNOSIS — R279 Unspecified lack of coordination: Secondary | ICD-10-CM | POA: Diagnosis not present

## 2015-02-20 DIAGNOSIS — R269 Unspecified abnormalities of gait and mobility: Secondary | ICD-10-CM | POA: Diagnosis not present

## 2015-02-20 DIAGNOSIS — R2681 Unsteadiness on feet: Secondary | ICD-10-CM | POA: Diagnosis not present

## 2015-02-20 DIAGNOSIS — M6281 Muscle weakness (generalized): Secondary | ICD-10-CM | POA: Diagnosis not present

## 2015-02-23 DIAGNOSIS — R269 Unspecified abnormalities of gait and mobility: Secondary | ICD-10-CM | POA: Diagnosis not present

## 2015-02-23 DIAGNOSIS — M6281 Muscle weakness (generalized): Secondary | ICD-10-CM | POA: Diagnosis not present

## 2015-02-23 DIAGNOSIS — R279 Unspecified lack of coordination: Secondary | ICD-10-CM | POA: Diagnosis not present

## 2015-02-23 DIAGNOSIS — R2681 Unsteadiness on feet: Secondary | ICD-10-CM | POA: Diagnosis not present

## 2015-02-24 DIAGNOSIS — M6281 Muscle weakness (generalized): Secondary | ICD-10-CM | POA: Diagnosis not present

## 2015-02-24 DIAGNOSIS — R279 Unspecified lack of coordination: Secondary | ICD-10-CM | POA: Diagnosis not present

## 2015-02-24 DIAGNOSIS — R2681 Unsteadiness on feet: Secondary | ICD-10-CM | POA: Diagnosis not present

## 2015-02-24 DIAGNOSIS — R269 Unspecified abnormalities of gait and mobility: Secondary | ICD-10-CM | POA: Diagnosis not present

## 2015-02-25 DIAGNOSIS — R279 Unspecified lack of coordination: Secondary | ICD-10-CM | POA: Diagnosis not present

## 2015-02-25 DIAGNOSIS — M6281 Muscle weakness (generalized): Secondary | ICD-10-CM | POA: Diagnosis not present

## 2015-02-25 DIAGNOSIS — R269 Unspecified abnormalities of gait and mobility: Secondary | ICD-10-CM | POA: Diagnosis not present

## 2015-02-25 DIAGNOSIS — R2681 Unsteadiness on feet: Secondary | ICD-10-CM | POA: Diagnosis not present

## 2015-02-27 DIAGNOSIS — R269 Unspecified abnormalities of gait and mobility: Secondary | ICD-10-CM | POA: Diagnosis not present

## 2015-02-27 DIAGNOSIS — M6281 Muscle weakness (generalized): Secondary | ICD-10-CM | POA: Diagnosis not present

## 2015-02-27 DIAGNOSIS — H10501 Unspecified blepharoconjunctivitis, right eye: Secondary | ICD-10-CM | POA: Diagnosis not present

## 2015-02-27 DIAGNOSIS — R279 Unspecified lack of coordination: Secondary | ICD-10-CM | POA: Diagnosis not present

## 2015-02-27 DIAGNOSIS — R2681 Unsteadiness on feet: Secondary | ICD-10-CM | POA: Diagnosis not present

## 2015-03-02 DIAGNOSIS — R2681 Unsteadiness on feet: Secondary | ICD-10-CM | POA: Diagnosis not present

## 2015-03-02 DIAGNOSIS — M6281 Muscle weakness (generalized): Secondary | ICD-10-CM | POA: Diagnosis not present

## 2015-03-02 DIAGNOSIS — R269 Unspecified abnormalities of gait and mobility: Secondary | ICD-10-CM | POA: Diagnosis not present

## 2015-03-02 DIAGNOSIS — R279 Unspecified lack of coordination: Secondary | ICD-10-CM | POA: Diagnosis not present

## 2015-03-03 DIAGNOSIS — R2681 Unsteadiness on feet: Secondary | ICD-10-CM | POA: Diagnosis not present

## 2015-03-03 DIAGNOSIS — R279 Unspecified lack of coordination: Secondary | ICD-10-CM | POA: Diagnosis not present

## 2015-03-03 DIAGNOSIS — M6281 Muscle weakness (generalized): Secondary | ICD-10-CM | POA: Diagnosis not present

## 2015-03-03 DIAGNOSIS — R269 Unspecified abnormalities of gait and mobility: Secondary | ICD-10-CM | POA: Diagnosis not present

## 2015-03-04 DIAGNOSIS — R2681 Unsteadiness on feet: Secondary | ICD-10-CM | POA: Diagnosis not present

## 2015-03-04 DIAGNOSIS — R269 Unspecified abnormalities of gait and mobility: Secondary | ICD-10-CM | POA: Diagnosis not present

## 2015-03-04 DIAGNOSIS — R279 Unspecified lack of coordination: Secondary | ICD-10-CM | POA: Diagnosis not present

## 2015-03-04 DIAGNOSIS — M6281 Muscle weakness (generalized): Secondary | ICD-10-CM | POA: Diagnosis not present

## 2015-03-04 DIAGNOSIS — H16202 Unspecified keratoconjunctivitis, left eye: Secondary | ICD-10-CM | POA: Diagnosis not present

## 2015-03-06 DIAGNOSIS — R2681 Unsteadiness on feet: Secondary | ICD-10-CM | POA: Diagnosis not present

## 2015-03-06 DIAGNOSIS — M6281 Muscle weakness (generalized): Secondary | ICD-10-CM | POA: Diagnosis not present

## 2015-03-06 DIAGNOSIS — R269 Unspecified abnormalities of gait and mobility: Secondary | ICD-10-CM | POA: Diagnosis not present

## 2015-03-06 DIAGNOSIS — R279 Unspecified lack of coordination: Secondary | ICD-10-CM | POA: Diagnosis not present

## 2015-03-09 DIAGNOSIS — M6281 Muscle weakness (generalized): Secondary | ICD-10-CM | POA: Diagnosis not present

## 2015-03-09 DIAGNOSIS — R2681 Unsteadiness on feet: Secondary | ICD-10-CM | POA: Diagnosis not present

## 2015-03-09 DIAGNOSIS — R269 Unspecified abnormalities of gait and mobility: Secondary | ICD-10-CM | POA: Diagnosis not present

## 2015-03-09 DIAGNOSIS — R279 Unspecified lack of coordination: Secondary | ICD-10-CM | POA: Diagnosis not present

## 2015-03-10 DIAGNOSIS — H16202 Unspecified keratoconjunctivitis, left eye: Secondary | ICD-10-CM | POA: Diagnosis not present

## 2015-03-11 DIAGNOSIS — R2681 Unsteadiness on feet: Secondary | ICD-10-CM | POA: Diagnosis not present

## 2015-03-11 DIAGNOSIS — M6281 Muscle weakness (generalized): Secondary | ICD-10-CM | POA: Diagnosis not present

## 2015-03-11 DIAGNOSIS — R279 Unspecified lack of coordination: Secondary | ICD-10-CM | POA: Diagnosis not present

## 2015-03-11 DIAGNOSIS — R269 Unspecified abnormalities of gait and mobility: Secondary | ICD-10-CM | POA: Diagnosis not present

## 2015-03-12 DIAGNOSIS — R2681 Unsteadiness on feet: Secondary | ICD-10-CM | POA: Diagnosis not present

## 2015-03-12 DIAGNOSIS — R279 Unspecified lack of coordination: Secondary | ICD-10-CM | POA: Diagnosis not present

## 2015-03-12 DIAGNOSIS — R269 Unspecified abnormalities of gait and mobility: Secondary | ICD-10-CM | POA: Diagnosis not present

## 2015-03-12 DIAGNOSIS — M6281 Muscle weakness (generalized): Secondary | ICD-10-CM | POA: Diagnosis not present

## 2015-03-18 DIAGNOSIS — H169 Unspecified keratitis: Secondary | ICD-10-CM | POA: Diagnosis not present

## 2015-03-25 DIAGNOSIS — H16202 Unspecified keratoconjunctivitis, left eye: Secondary | ICD-10-CM | POA: Diagnosis not present

## 2015-03-31 NOTE — Op Note (Signed)
PATIENT NAME:  [name], [name] MR#:  [id number] DATE OF BIRTH:  03-06-1914  DATE OF PROCEDURE:  08/07/2012  PREOPERATIVE DIAGNOSIS: Left comminuted, displaced four-part intertrochanteric hip fracture.   POSTOPERATIVE DIAGNOSIS: Left comminuted, displaced four-part intertrochanteric hip fracture.   OPERATION: Intramedullary fixation of left four-part intertrochanteric hip fracture.   SURGEON: Thornton Park, MD   ANESTHESIA: Spinal.   ESTIMATED BLOOD LOSS: 200 mL.   COMPLICATIONS: None.   IMPLANTS: Biomet long Affixus 11 x 380 mm intramedullary rod with 100 mm lag screw and two distal interlocking screws with lengths of 46 and 44 mm.   INDICATIONS FOR PROCEDURE: The patient is a 82 year old female who lives independently and is an independent ambulator at baseline. She uses the assistance of a walker for walking long distances. The patient sustained a mechanical fall at home while going to answer the telephone. She was unable to stand following her fall. She was found to have a four-part displaced intertrochanteric hip fracture upon presentation to the [facility] Emergency Department. The patient was admitted to the Orthopedic Surgery service. She was cleared by the [facility] for surgery. I have recommended intramedullary rod fixation of this fracture given the comminution and the extension below the lesser trochanter. I believe surgery is recommended given the patient's high level of functioning at baseline.   I reviewed the risks and benefits of surgery with the patient and her son, who is her Power of Edgar, in the Emergency Room. They understand what the proposed surgery entails and the postoperative course. They also understand the risks of surgery which I explained to include infection, bleeding requiring blood transfusion, nerve or blood vessel injury, malunion, nonunion, failure of the hardware, painful hardware, persistent left hip pain, failure to return to  ambulation or baseline function, a lag screw cutout and the need for further surgery. Medical complications they understand include, but are not limited to, deep vein thrombosis and pulmonary embolism, myocardial infarction, stroke, pneumonia, respiratory failure, and death. The patient understood these risks as did her son. Her son signed the consent form. The patient had her left hip signed with the word yes within the operative field according to the hospital's Right Site protocol.   PROCEDURE NOTE: The patient was brought to the Operating Room where she underwent a spinal anesthetic. She was positioned supine on the fracture table. The left leg was placed in a legholder and the right leg was placed in a hemilithotomy position. All bony prominences were adequately padded. A peroneal post was also padded adequately to prevent injury during traction.   A time out was performed to verify the patient's name, date of birth, medical record number, correct site of surgery, and correct procedure to be performed. It was also used to verify the patient had received antibiotics and that all appropriate instruments, implants, and radiographic studies were available in the room. Once all in attendance were in agreement, the case began.   A closed reduction was attempted with longitudinal traction and internal rotation. This allowed for adequate reduction of the fracture. However, on the lateral view the patient had flexion of the proximal fragment. For this reason, the decision was made to open the fracture site and reduce the angulation at the fracture.   A #10 blade was used to make a lateral incision over the left thigh and centered over the fracture site. The subcutaneous tissues were carefully dissected using electrocautery. The fascia lata was then identified and cleared from overlying soft tissue with a  Cobb elevator. A deep #10 blade was used to then incise the fascia lata. The vastus lateralis fascia and  muscle were then identified. The fracture site could be manually palpated, and a defect that the fracture had created in the vastus lateralis was used to allow for access to the fracture site with a fracture reduction clamp. The fracture was manually reduced and held in position with a "[place] claw" type fracture reduction clamp. AP and lateral images of the fracture reduction were then taken to confirm adequate reduction. A single incision was then made superior to the tip of the greater trochanter for insertion of the nail. The tip of the greater trochanter was then palpated manually. A drill guidepin was then advanced into the tip of the greater trochanter and advanced into the proximal femur to the level of below the lesser trochanter. A guidepin position was confirmed on AP and lateral C-arm projections. This guidepin, once in adequate position, was then overdrilled with a 15 mm proximal femoral drill bit. A ball-tip guidewire was then inserted into the femur and down to the distal portion of the femur. Its position in the intramedullary canal was confirmed on AP and lateral C-arm images. This was then measured with a depth gauge to be 380 mm in length. Sequential reamers were then used to ensure an 11 mm long rod could be inserted into the femur. These were used sequentially starting with a size 9 and ending with a size 13. An 11 x 380 mm long Affixus rod was then inserted into the femur using the insertion guide arm. Its position was again confirmed on AP and lateral C-arm projections. Through the first incision, a lag screw drill guide sleeve was brought along the lateral aspect of the femur. A drill pin was then advanced through the intramedullary rod and across the fracture site into the femoral head. It was measured to be 100 mm in length. This guidepin was also overdrilled with a lag screw drill guide to a depth of 100 mm. A second guidepin was placed to prevent rotation of the proximal fragment during  lag screw insertion. The lag screw was then inserted manually to a depth of 100 mm. The derotational guidepin was then removed. The set screw was then tightened proximally and then loosened a quarter turn to allow for the lag screw to compress, if necessary. The intramedullary rod insertion arm was then removed. The attention was turned to distal interlocking screw placement.   Using a perfect circle technique, two distal interlocking screws were placed, one through the static hole and one through the dynamic hole. This was performed using a freehand technique. The first screw was 46 mm in length and the second was 44 mm in length. Both screws were confirmed to be in position on AP and lateral C-arm images. Once both distal interlocking screws were in position, final C-arm images of the distal femur as well as the proximal femur including the fracture site were taken. All wounds were then copiously irrigated. The fascia lata and the initial incision was closed using interrupted 0 Vicryl.  The wound was copiously irrigated again. The soft tissue of both proximal incisions were then closed with a 2-0 Vicryl. The two distal stab incisions were closed with skin staples. The skin of the proximal two incisions was also approximated with skin staples. Dry sterile dressings were applied. The patient was then transferred from the operating table to a hospital bed where she was brought to the PAC-U in  stable condition. I was scrubbed and present for the entire case, and all sharp and instrument counts were correct at the conclusion of the case.   I spoke with the patient's family in the postoperative consultation room to let them know that the patient had tolerated surgery well from an anesthesia standpoint, the case had gone without complication, and the patient was stable in the recovery room.   ____________________________ Timoteo Gaul, MD [id number] D: 08/07/2012 23:44:43 ET T: 08/08/2012 10:45:27  ET JOB#: [id number]  cc: Timoteo Gaul, MD, <Dictator> Timoteo Gaul MD ELECTRONICALLY SIGNED 08/08/2012 14:40

## 2015-03-31 NOTE — H&P (Signed)
Subjective/Chief Complaint Left hip pain    History of Present Illness Patient is a 81 year old female who describes falling at home today after she stood up and turned to go get a telephone on a nearby table.  Patient lost her balance and fell.  She landed on her left side and was unable to stand after the injury.  Patient denies syncope, LOC or other injuries. Her son is at the bedside during my ER evaluation.   Past Med/Surgical Hx:  Osteoporosis:   Glaucoma:   Hypothyroidism:   Tonsillectomy:   C-Section:   Hysterectomy - Total:   ALLERGIES:  No Known Allergies:   HOME MEDICATIONS: Medication Instructions Status  levothyroxine 50 mcg (0.05 mg) oral tablet 1 tab(s) orally once a day Active  metoprolol tartrate 25 mg oral tablet 1 tab(s) orally once a day Active  pantoprazole 40 mg oral delayed release tablet 1 tab(s) orally 2 times a day, As Needed- for Indigestion, Heartburn  Active  ramipril 5 mg oral capsule 1 cap(s) orally once a day Active  levobunolol 0.5% ophthalmic solution 1 drop(s) to each eye once a day Active  Centrum Silver oral tablet 1 tab(s) orally once a day Active  Caltrate 600 + D oral tablet 1 tab(s) orally once a day Active  Vitamin C 500 mg oral tablet 1 tab(s) orally once a day Active   Family and Social History:   Family History Non-Contributory    Social History negative tobacco    Place of Living Home   Review of Systems:   Subjective/Chief Complaint Left hip pain    Medications/Allergies Reviewed Medications/Allergies reviewed   Physical Exam:   GEN no acute distress, thin    HEENT PERRL, hearing intact to voice, moist oral mucosa, Oropharynx clear    NECK supple  No masses  trachea midline    RESP normal resp effort  clear BS  no use of accessory muscles    CARD regular rate  no murmur  no JVD    ABD denies tenderness  soft  normal BS    LYMPH negative neck    EXTR Left lower extremity: skin intact.  Lower extremity shortened  and extrernally rotated.  Pedal pulses are palpable with intact sensation to light touch and intact motor function to manual testing bilaterally.    SKIN normal to palpation, No rashes, No ulcers    NEURO motor/sensory function intact    PSYCH A+O to time, place, person, good insight   Radiology Results: XRay:    26-Aug-13 15:54, Pelvis AP Only   Pelvis AP Only   REASON FOR EXAM:    pain following fall  COMMENTS:       PROCEDURE: DXR - DXR PELVIS AP ONLY  - Aug 06 2012  3:54PM     RESULT:     A comminuted intertrochanteric left hip fracture is identified. The   fracture fragment demonstrates medial displacement and medial angulation.    IMPRESSION:  Intertrochanteric left hip fracture.      Thank you for this opportunity to contribute to the care of your patient.       Verified By: Mikki Santee, M.D., MD    26-Aug-13 15:57, Chest 1 View AP or PA   Chest 1 View AP or PA   REASON FOR EXAM:    pain following fall  COMMENTS:       PROCEDURE: DXR - DXR CHEST 1 VIEWAP OR PA  - Aug 06 2012  3:57PM     RESULT:  Frontal view of the chest is performed.     FINDINGS: The patient has taken a shallow inspiration. There is   prominence of the interstitial markings. Vague, ill-defined density   projects along the right hilar and right heart border regions. A vague   nodular density projects in the left upper lobe. The cardiac silhouette   is moderately enlarged. There is a milddextroscoliosis within the   thoracic spine. The visualized bony skeleton is unremarkable.    IMPRESSION:    1.  Indeterminate nodular density in the left upper lobe.  2.  Vague, ill-defined density along the right heart border. These   findings may be secondary to shallow inspiration and repeat two view   chest is recommended.  3.  Findings likely representing an underlying component of pulmonary   fibrosis.      Thank you for this opportunity to contribute to the care of your patient.            Verified By: Mikki Santee, M.D., MD    26-Aug-13 16:04, Femur Left   Femur Left   REASON FOR EXAM:    pain following fall  COMMENTS:       PROCEDURE: DXR - DXR FEMUR LEFT  - Aug 06 2012  4:04PM     RESULT:     A comminuted intertrochanteric fracture is identified with severe   displacement and slight medial angulation.    IMPRESSION:  Comminuted intertrochanteric fracture.      Thank you for this opportunity to contribute to the care of your patient.       Verified By: Mikki Santee, M.D., MD  CT:    05-May-13 04:50, CT Abdomen and Pelvis With Contrast   CT Abdomen and Pelvis With Contrast   REASON FOR EXAM:    (1) pain; (2) pain, rectal bleeding  COMMENTS:       PROCEDURE: CT  - CT ABDOMEN / PELVIS  W  - Apr 15 2012  4:50AM     RESULT: Axial CT scanning was performed through the abdomen and pelvis   with reconstructions at 3 mm intervals and slice thicknesses following   intravenous administration of 100 cc of Isovue-370. The patient also   received oral contrast material.    The orally administered contrast has traversed the small bowel and   reached the cecum. There is thickening of the wall of the distal   descending colon as well as diffusely throughout the sigmoid colon   consistent with colitis or diverticulitis. I do not see evidence of   abscess formation nor free extraluminal gas fluid. The bowel does not     appear obstructed. More proximally the small bowel loops exhibit mild   distention with gas fluid and contrast suggesting an ileus. The ascending   and transverse portions of the colon exhibit no suspicious findings.    The liver exhibits no acute abnormality. A subcentimeter cyst in the   right lobe is suspected. The gallbladder, pancreas, spleen, moderately   distended stomach, adrenal glands, and kidneys exhibit no acute   abnormality. A cortical hypodensity in the midpole of the left kidney   laterally is most compatible with a  cyst. The caliber of the abdominal   aorta is normal.    There are small fat-containing inguinal hernias bilaterally. The lung   bases exhibit emphysematous changes. The lumbar vertebral bodies are   preserved in height. There is degenerative disc  change at multiple   levels. There is sclerosis along the superior endplate of L3.  IMPRESSION:   1. There is generalized thickening of the wall of the distal descending   colon and rectosigmoid consistent with colitis either infectious or   conceivably ischemic. There is no significant diverticulosis. There is no   evidence of perforation or abscess formation.  2. There is mild ileus of the small bowel.  3. I see no acute hepatobiliary abnormality nor acute urinary tract   abnormality.    A preliminary report was sent to the emergency department at the   conclusion of the study.    Dictation Site: 1        Verified By: DAVID A. Martinique, M.D., MD     Assessment/Admission Diagnosis Left comminuted, displaced intertrochanteric hip fracture    Plan I have explained the diagnosis to the patient and her son.  I reviewed the x-rays with them in the ER.  They understand the severity of the injury.  I have recommended intramedullary fixation for this fracture given the comminution and extension inferior to the lesser trochanter.  I have reviewed the details of the operation and post-op hospital course.  I reviewed the risks and benefits of surgery.  The risks include, but are not limited to: infection, bleeding requiring transfusion, nerve and blood vessel injury, leg length discrepancy, change in lower extremity rotation, malunion, nonunion, failure of the hardware, painful hardware, persistent left hip pain, cut-out of the lag screw, and the need for more surgery including conversion to a total hip arthroplasty, failure to return to ambulation as well as medical complications such as DVT, and PE, MI, stroke, pneumonia, respiratory failure and death.   The risks and benefits of surgical intervention were discussed in detail with the patient and her son and they expressed understanding of the risks and benefits and agreed with plans for surgery.  The patient's son, who is power of attorney, signed the Consent form for the patient.  Patient will be evaluated by [facility] later this evening.  I am admitting her to the orthopaedic surgery service and booked surgery for her tomorrow AM.  Patient will be NPO after midnight.  I have personally reviewed all labs and radiographic studies.   Electronic Signatures: Thornton Park (MD)  (Signed 26-Aug-13 19:00)  Authored: CHIEF COMPLAINT and HISTORY, PAST MEDICAL/SURGIAL HISTORY, ALLERGIES, HOME MEDICATIONS, FAMILY AND SOCIAL HISTORY, REVIEW OF SYSTEMS, PHYSICAL EXAM, Radiology, ASSESSMENT AND PLAN   Last Updated: 26-Aug-13 19:00 by Thornton Park (MD)

## 2015-03-31 NOTE — Consult Note (Signed)
PATIENT NAME:  [name], [name] MR#:  [id number] DATE OF BIRTH:  [birth year]  DATE OF CONSULTATION:  08/06/2012  REFERRING PHYSICIAN:  Thornton Park, MD CONSULTING PHYSICIAN:  Thurston Sink, MD  REASON FOR ADMISSION: Left hip fracture.  HISTORY OF PRESENT ILLNESS: Ms. Hairston is a very nice 81 year old female who has history of hypertension, hypothyroidism, and osteoporosis. In the past, she was admitted for rectal bleeding due to infectious colitis, which has resolved; that happened on 04/15/2012. She took antibiotics and did not have anymore bleeding.   Today she comes with a history of a fall. The patient states that she was going to pick up the telephone and just right after that tripped and fell and had significant pain of the left hip. She landed on her buttocks and she was not able to get around. She states that she tried to sit up and she could not do it by herself. The patient denies any syncope. She says that she just tripped and fell. She denies any loss of consciousness or any head injury. Her son was able to help her out.   REVIEW OF SYSTEMS: CONSTITUTIONAL: The patient denies any fever, chills, or night sweats. EYES: Denies any blurry vision, pain, redness, inflammation, glaucoma, or cataracts. ENT: Denies any tinnitus, ear pain, hearing loss, snoring, nasal drip, sinus pain epistaxis, or allergies. She denies any difficulty swallowing. RESPIRATORY: Denies any cough, wheezing, hemoptysis, dyspnea, apnea, asthma, or chronic obstructive pulmonary disease. CARDIOVASCULAR: Denies any chest pain, orthopnea, persistent nocturnal dyspnea, or palpitations. Never had chest pain in her life. She states that she does not have any exertional dyspnea. She has been getting tired whenever she walks around, but no significant pain or tightness of her chest. GASTROINTESTINAL: Denies any nausea, vomiting, diarrhea, abdominal pain, hematemesis, melena, ulcers, GERD, jaundice, or rectal bleeding. She  did have the rectal bleeding before the infection, but that has resolved. No hemorrhoids. No change in her bowels. GU: Denies any dysuria, hematuria, frequent urination, or incontinence. She actually can hold her urine very well.  She denies any polyuria, polydipsia, polyphagia, Thyroid problems, or cold or heat intolerance. HEME/LYMPH: Denies any symptoms like anemia, easy bruising, bleeding, or swollen glands. SKIN: Denies any acne, rashes, lesions, or change in her moles. MUSCULOSKELETAL: Denies any pain in her back, shoulders, hips, or back. Denies any swelling, gout, redness or limited activity prior to this fall. Now she is complaining of left hip pain. NEUROLOGIC: Denies any numbness, weakness, dysarthria, vertigo, ataxia, dementia, CVA, TIA, or seizures. PSYCHIATRIC: Denies any insomnia, bipolar disorder, depression, or nervousness.   PAST MEDICAL HISTORY:  1. Hypertension.  2. Hypothyroidism.  3. Osteoporosis.  4. History of peptic ulcer with bleeding of duodenal ulcer and esophagitis in the past.   PAST SURGICAL HISTORY:  1. Cataract surgery.  2. Breast lumpectomy.  3. Hysterectomy.  4. Cesarean section.   SOCIAL HISTORY: Denies any smoking or drinking. She lives by herself. Her son lives close by. She is pretty much independent. She is a widow.   FAMILY HISTORY: Her father had hypertension and her mother had a heart attack. She states that there are several members of her family with cancer, especially breast cancer and ovarian cancer. Her aunt died from lung cancer.   ALLERGIES: No known drug allergies.   MEDICATIONS:  1. Metoprolol 25 mg twice daily.  2. Protonix 40 mg twice daily.  3. Ramipril 5 mg daily. 4. Levothyroxine 50 mcg once daily.   PHYSICAL EXAMINATION:   VITAL SIGNS:  Blood pressure is 154/81, pulse 65, respiratory rate 18, and temperature 97.9.   GENERAL: The patient is elderly, but she looks younger than her age. She is comfortable in bed. She is lying down  with a left traction device of her left lower extremity. Hemodynamically stable, comfortable.  HEENT: Normocephalic, atraumatic. Pupils are equal and reactive. Extraocular movements are intact. Mucosa is moist. No oropharyngeal exudate. No oral lesions. Anicteric sclerae. Pink conjunctivae.   NECK: Supple. No JVD. No thyromegaly. No adenopathy. Trachea is central. No masses.  HEART: S1 and S2, no murmurs, rubs, or gallops. Carotids without bruits.   LUNGS: Clear without any wheezing or crepitus.   ABDOMEN: Soft, nontender, and nondistended. No hepatosplenomegaly. No masses. No hernias. No hepatic stigmata.   GENITAL: Negative for external lesions.   MUSCULOSKELETAL: No significant swelling. There is tenderness to palpation to the left femoral trochanter. There is bunion deformity in bilateral lower extremities. There is no clubbing, no cyanosis. Capillary refill is less than 3 seconds. Sensation seems to be maintained.   NEUROLOGIC: Cranial nerves II through XII are intact. No focal neurologic deficit. It is difficult to evaluate her left lower extremity due to fracture, but the lower extremities have strength five out of five.  PSYCHIATRIC: Negative for depression or anxiety. Her mood seems to be stable. No flat affect.   SKIN: No significant petechiae or rashes.   MUSCULOSKELETAL: No significant joint deformity, exudates, or erythema.   LABORATORY, DIAGNOSTIC AND RADIOLOGIC DATA: Glucose 119, BUN 22, creatinine 85, and sodium 133. GFR 57. White count is 7.5, hemoglobin 13, and platelets 210.  Urinalysis is within normal limits with 13 white blood cells but no signs of urine infection.   Since the patient has a Foley catheter, we are going to order a urine culture.   The first EKG shows some signs of possible ischemia in the lateral leads, although the patient does not have any history of coronary artery disease. Repeat EKG does not really seem to have any significant ST depression and  the first EKG did not have any depression, just inversion of T waves.   Chest x-ray shows no significant abnormalities.   ASSESSMENT AND PLAN:  1. Status post left hip fracture. The patient is to go for surgery tomorrow. Since the patient is low risk for surgery and even if she had some risks, the surgery is indicated as she needs her hip replaced. She has no history of coronary artery disease. The changes on the EKG mentioned above might not be significant. Cardiac enzymes were checked just to be sure, but there is no significant cardiac work-up that needs to be done at this moment. I discussed the risks with the patient and since the surgery is low risk the patient accepts to go for surgery. She is able to complete more than four METS of physical activity without ever having shortness of breath or chest pain for which I think she could tolerate surgery without a problem. I will recommend for her to have her beta blocker given to her and not to be discontinued. Her ACE inhibitor could be held. Orders have been given to give that medication. As far as deep vein thrombosis prophylaxis after surgery, the patient is required to have Lovenox. I think with her history of GI bleeding, it will be safe to do it since it was infectious. She had peptic ulcer disease before, but as long as she stays taking her Protonix it should not be a problem for what  I am going to order Protonix for the patient. Other than that, the patient seems to be stable.  2. Hypertension. The patient has history of high blood pressure. Metoprolol and lisinopril are to be continued.  3. History of gastrointestinal bleeding in the past. I think again it would be safe to use anticoagulation if the patient remains on double dose of PPI.  4. Her other medical problems seem to be stable.  5. The patient has hyponatremia which is likely due to mild dehydration, but we will keep on watching.  6. Increased BUN, also due to mild dehydration. The  patient is to continue on IV fluids overnight while n.p.o.  7. Elevated white blood count in urine. The patient actually told me later on that she was having some increased frequency lately, so we are going to go ahead and give her IV fluids. Since she had ordered Ringer's lactate, I am going to change it to NS since the patient has hyponatremia.       8. The patient is to receive cephazolin preoperatively and start DVT prophylaxis after the procedure.  TIME SPENT WITH PATIENT: About 40 minutes. ____________________________ [place] Sink, MD rsg:slb D: 08/06/2012 20:45:15 ET T: 08/07/2012 09:10:33 ET JOB#: [id number]  cc: [place] Sink, MD, <Dictator> Timoteo Gaul, MD Cristi Loron MD ELECTRONICALLY SIGNED 08/07/2012 14:46

## 2015-03-31 NOTE — Discharge Summary (Signed)
PATIENT NAME:  [name], [place] MR#:  [id number] DATE OF BIRTH:  Feb 15, 1914  DATE OF ADMISSION:  08/06/2012 DATE OF DISCHARGE:  08/10/2012  ADMITTING DIAGNOSIS: Left comminuted intratrochanteric hip fracture.   HISTORY: Ms. Bankson is a 81 year old female who sustained a mechanical fall at home while trying to answer the telephone. She was brought to [facility] Emergency Department where she was diagnosed with a four-part comminuted intertrochanteric hip fracture by x-ray. She was admitted to the Orthopedic Surgery Service for further evaluation and management.   PAST MEDICAL/SURGICAL HISTORY:  1. Osteoporosis. 2. Glaucoma.  3. Hypothyroidism.  4. Status post tonsillectomy. 5. Hysterectomy. 6. Cesarean section.   HOME MEDICATIONS:  1. Levothyroxine 50 mcg p.o. daily.  2. Metoprolol 25 mg daily.  3. Pantoprazole 40 mg b.i.d. p.r.n. for indigestion and heartburn. 4. Ramipril 5 mg daily.  5. Levobunolol  0.5% ophthalmic solution, 1 drop each eye daily. 6. Centrum Silver daily. 7. Caltrate 600 Plus D oral tablet once daily.  8. Vitamin C 500 mg daily.   ALLERGIES: No known drug allergies.   HOSPITAL COURSE: The patient was admitted on 08/06/2012 to the Orthopedic Surgery Service. She was seen and cleared for surgery by the [facility]. The patient was brought to the Operating Room on 08/07/2012 and underwent an uncomplicated intramedullary fixation of her left hip fracture. The patient postoperatively was brought to the Orthopedic floor where she remained throughout her hospitalization. She had 24 hours of postoperative antibiotics. The patient had Physical and Occupational Therapy consults called for postoperative day one, and they continued to follow her throughout her hospital stay. The patient was also monitored by the [facility] while she was an inpatient. On postoperative day one, the patient was comfortable and doing well. Her dressing was  clean, dry, and intact. Dr. Mack Guise visited the patient each day that she was in the hospital. The patient was on Lovenox 30 mg subcutaneous b.i.d. for deep vein thrombosis prophylaxis as well as TED stockings and AV1s.   On postoperative day two, the patient's catheter was removed. Her dressing was also changed to Coverderm. On postoperative day three, the patient complained of fatigue and some dizziness when she is stood with the therapist and nurses to use the restroom. Her hematocrit had dropped to 23, and her hemoglobin was 8.4. Given the fact that she was having presyncopal symptoms, she was transfused 1 unit of packed red blood cells. The patient responded to this well, and the following day her hemoglobin was 9.3 with a hematocrit of 26.3. The patient's vital signs remained stable and she was afebrile. Given the patient's clinical improvement, she was prepared for discharge to rehab.   DISCHARGE PHYSICAL EXAMINATION: The patient's bandages were changed by Dr. Mack Guise prior to discharge. Her thigh was soft and compressible without any significant ecchymosis. She had no erythema or active drainage from her incisions. The patient had no calf tenderness or lower leg edema. She had palpable pedal pulses, intact sensation to light touch, and intact motor function in the muscle groups in the bilateral lower extremities.   DISCHARGE INSTRUCTIONS:  1. The patient will be discharged with instructions that she should continue physical and occupational therapy.  2. She may weight-bear as tolerated on the left lower extremity as her pain allows.  3. She should continue wearing TED  stockings and remain on Lovenox 40 mg daily for deep vein thrombosis prophylaxis.  4. She should elevate the left lower extremity when in bed and  may have ice applied to her lateral thigh.  5. She should have daily dressing changes until the incisions are clean, dry, and intact.   FOLLOWUP: The patient will follow up with Dr.  Mack Guise in 7 to 10 days in the office for wound check and staple removal.   I discussed this plan with the patient and her son, who understood and agreed with this plan.    DISCHARGE MEDICATIONS:  1. Lovenox 40 mg subcutaneous daily for deep vein thrombosis prophylaxis.  2. Norco 5/325 mg with instructions to take 1 to 2 tabs every 4 to 6 hours p.r.n. for pain.  3. The patient will restart all home medications as noted above.   DISCHARGE DIAGNOSES:  1. Status post open reduction internal fixation of left intertrochanteric hip fracture.  2. Acute postoperative blood loss anemia requiring a blood transfusion.   ____________________________ Timoteo Gaul, MD klk:cbb D: 08/10/2012 15:44:57 ET T: 08/10/2012 15:58:07 ET JOB#: [id number]  cc: Timoteo Gaul, MD, <Dictator> Timoteo Gaul MD ELECTRONICALLY SIGNED 08/22/2012 17:22

## 2015-04-02 DIAGNOSIS — H169 Unspecified keratitis: Secondary | ICD-10-CM | POA: Diagnosis not present

## 2015-04-04 NOTE — Discharge Summary (Signed)
Dates of Admission and Diagnosis:  Date of Admission 26-Sep-2014   Date of Discharge 29-Sep-2014   Admitting Diagnosis Acute on chronic back pain   Final Diagnosis 1. lumbar spine transverse process fractures - L5 2. Right sacral ala fracture 3. Old L3 compression fracture 4. HTN 5. UTI - Cx negative 6. Osteopenia 7. Glaucoma    Chief Complaint/History of Present Illness PRIMARY CARE PHYSICIAN: Dr. Rosanna Randy  CHIEF COMPLAINT: Lower back pain and difficulty ambulating.   HISTORY OF PRESENT ILLNESS: This is a 81 year old female who presents to the Emergency Room from a skilled nursing facility due to lower back pain and having difficulty ambulating. The patient was recently discharged to [facility] after a recent fall and suffering a pelvic fracture. She was getting physical therapy at [facility] skilled nursing facility. She is was actually doing pretty well with the physical therapy until the past 2 days where she started developing worsening lower back pain and difficulty ambulating. She was therefore sent over to the ER for further evaluation. In the Emergency Room, the patient underwent a CT of her lumbar spine which showed bilateral nondisplaced transverse processes fracture of L5. She also has old compression fracture and she was also noted to have multilevel degenerative disk disease. Due to her significantly abnormal CT scan and her intractable back pain, hospitalist services were contacted for further treatment and evaluation.   Allergies:  No Known Allergies:   Hepatic:  15-Oct-15 13:04   Bilirubin, Total 0.7  Alkaline Phosphatase  155 (46-116 NOTE: New Reference Range 07/01/14)  SGPT (ALT) 20 (14-63 NOTE: New Reference Range 07/01/14)  SGOT (AST) 23  Total Protein, Serum 7.3  Albumin, Serum 3.4  Routine Chem:  15-Oct-15 13:04   Creatinine (comp) 0.62  eGFR (African American) >60  eGFR (Non-African American) >60 (eGFR values <81m/min/1.73 m2 may be an indication of  chronic kidney disease (CKD). Calculated eGFR, using the MRDR Study equation, is useful in  patients with stable renal function. The eGFR calculation will not be reliable in acutely ill patients when serum creatinine is changing rapidly. It is not useful in patients on dialysis. The eGFR calculation may not be applicable to patients at the low and high extremes of body sizes, pregnant women, and vetetarians.)  Glucose, Serum  101  BUN 16  Sodium, Serum  131  Potassium, Serum 4.3  Chloride, Serum 98  CO2, Serum 27  Calcium (Total), Serum 8.6  Osmolality (calc) 264  Anion Gap  6  Routine Hem:  15-Oct-15 13:04   WBC (CBC) 10.9  RBC (CBC) 4.71  Hemoglobin (CBC) 14.2  Hematocrit (CBC) 44.0  Platelet Count (CBC) 279 (Result(s) reported on 25 Sep 2014 at 01:20PM.)  MCV 93  MCH 30.2  MCHC 32.4  RDW 14.1   PERTINENT RADIOLOGY STUDIES: LabUnknown:    15-Oct-15 10:19, CT Lumbar Spine Without Contrast  PACS Image   CT:  CT Lumbar Spine Without Contrast   REASON FOR EXAM:    hx of lumbar compression fx, now with left leg   feeling heavy, inability to walk  COMMENTS:       PROCEDURE: CT  - CT LUMBAR SPINE WO  - Sep 25 2014 10:19AM     CLINICAL DATA:  81year old female with recently diagnosed pelvic  fracture at L3 compression fracture, with recent difficulty  ambulating or sitting up secondary to low back pain. Heaviness in  left leg over the past 24 hr.    EXAM:  CT LUMBAR SPINE WITHOUT CONTRAST  TECHNIQUE:  Multidetector CT imaging of the lumbar spine was performed without  intravenous contrast administration. Multiplanar CT image  reconstructions were also generated.    COMPARISON:  Lumbar spine radiograph 09/13/2014.    FINDINGS:  Again noted is a compression fracture at L3, which does not appear  acute. This is associated with approximately 25% loss of anterior  vertebral body height and approximately 10% loss of posterior  vertebral body height. No other acute  compression fractures are  noted. However, there are acute nondisplaced fractures through the  transverse processes of L5 bilaterally. 4 mm of retrolisthesis of  T12 upon L1. 7 mm of anterolisthesis of L4 upon L5. Alignment is  otherwise anatomic. Multilevel degenerative disc disease, most  severe at T12-L1, at L4-L5 and L5-S1. Multilevel facet arthropathy,  most severe at L5-S1. Previously noted insufficiency fracture of the  left sacral ala is similar to the prior study, however, there is a  new insufficiency fracture in the right sacral ala. These are both  nondisplaced.    Visualized portions of the abdomen are remarkable for incompletely  visualized low-attenuation lesions in the left kidney, which appears  similar to prior study 04/15/2012, presumably cysts. Extensive  atherosclerosis throughout the visualized abdominal and pelvic  vasculature, without definite aneurysm.     IMPRESSION:  1. Previously noted insufficiency fracture of the left sacral ala is  unchanged. However, there is a new insufficiency fracture through  the right sacral ala, and there are new bilateral nondisplaced  transverse process fractures at L5.  2. Old compression fracture at L3 with approximately 25% loss of  anterior vertebral body height and 10% loss of posterior vertebral  body height appears similar to the prior study.  3. Multilevel degenerative disc disease and lumbar spondylosis, as  above, most severe at L4-L5 where there is 7 mm of anterolisthesis  of L4 upon L5.  4. Additional incidental findings, as above.      Electronically Signed    By: Vinnie Langton M.D.    On: 09/25/2014 10:34     Verified By: Etheleen Mayhew, M.D.,   Pertinent Past History:  Pertinent Past History HTN Glaucoma Osteoporosis   Hospital Course:  Hospital Course 81 yo female w/ hx of HTN, Hypothyroidism, hx of recent pelvic fracture, osteoporosis, Glaucoma came into hospital due to back pain and  difficulty ambulating and noted to have Lumar spine fracture of trasverse process.    1. Back Pain/difficulty ambulating - due to lumbar spine transverse process fractures.   - Ortho consulted and appreciate help. PT consulted and working with pt. FWB, activity as tolerated. Norco dosing increased to 2 tabs from 1 and pain well controlled now  2. HTN - bp stable.  - cont. Ramipril, Metoprolol  3. Hypothyroidism - cont. synthroid.   4. Glaucoma - cont. Betagan.   5. UTI Started on Ciprofloxacin. Not needed after discharge.  Time spent on discharge 40 minutes   Condition on Discharge Guarded   Code Status:  Code Status No Code/Do Not Resuscitate   PHYSICAL EXAM ON DISCHARGE:  Physical Exam:  GEN no acute distress, thin   RESP normal resp effort  clear BS   CARD regular rate   SKIN No rashes   PSYCH alert, good insight   VITAL SIGNS:  Vital Signs: **Vital Signs.:   19-Oct-15 09:41  Vital Signs Type Routine  Temperature Temperature (F) 97.7  Celsius 36.5  Temperature Source oral  Pulse Pulse 62  Respirations Respirations 16  Systolic BP Systolic BP 098  Diastolic BP (mmHg) Diastolic BP (mmHg) 77  Mean BP 96  Pulse Ox % Pulse Ox % 96  Pulse Ox Activity Level  At rest  Oxygen Delivery Room Air/ 21 %   DISCHARGE INSTRUCTIONS HOME MEDS:  Medication Reconciliation: Patient's Home Medications at Discharge:     Medication Instructions  levothyroxine 50 mcg (0.05 mg) oral tablet  1 tab(s) orally once a day   pantoprazole 40 mg oral delayed release tablet  1 tab(s) orally 2 times a day, As Needed- for Indigestion, Heartburn    ramipril 5 mg oral capsule  1 cap(s) orally once a day   levobunolol 0.5% ophthalmic solution  1 drop(s) to each eye once a day   vitamin c 500 mg oral tablet  1 tab(s) orally once a day   norvasc 5 mg oral tablet  1 tab(s) orally once a day   metoprolol tartrate 25 mg oral tablet  0.5  orally once a day(Half tab daily)   calcium 600+d  1  tab(s) orally once a day   cerovite senior therapeutic multiple vitamins with minerals oral tablet  1 tab(s) orally once a day   aspirin 325 mg oral tablet  1 tab(s) orally 2 times a day   methocarbamol 500 mg oral tablet  2 tab(s) orally every 6 hours, As Needed   milk of magnesia 8% oral suspension  30 milliliter(s) orally once a day (at bedtime), As Needed   vitamin d3 2000 intl units oral tablet  1 tab(s) orally once a day   acetaminophen-hydrocodone 325 mg-10 mg oral tablet  1 tab(s) orally every 4 hours, As Needed - for Pain   docusate-senna 50 mg-8.6 mg oral tablet  1 tab(s) orally 2 times a day   bisacodyl 10 mg rectal suppository  1 suppository(ies) rectal once a day, As needed, constipation    STOP TAKING THE FOLLOWING MEDICATION(S):    acetaminophen 325 mg oral tablet: 2 tab(s) orally 4 times a day oxycodone 5 mg oral tablet: 1 tab(s) orally every 3 hours, As Needed  Physician's Instructions:  Diet Low Sodium   Activity Limitations As tolerated  with assistance   Return to Work Not Applicable   Time frame for Follow Up Appointment 1-2 weeks  Dr. Marry Guan   Electronic Signatures: Darvin Neighbours, Lottie Dawson (MD)  (Signed 19-Oct-15 12:55)  Authored: ADMISSION DATE AND DIAGNOSIS, CHIEF COMPLAINT/HPI, Allergies, PERTINENT LABS, PERTINENT RADIOLOGY STUDIES, PERTINENT PAST HISTORY, HOSPITAL COURSE, PHYSICAL EXAM ON DISCHARGE, VITAL SIGNS, DISCHARGE INSTRUCTIONS HOME MEDS, PATIENT INSTRUCTIONS   Last Updated: 19-Oct-15 12:55 by Alba Destine (MD)

## 2015-04-04 NOTE — Consult Note (Signed)
PATIENT NAME:  [name], [name] MR#:  [id number] DATE OF BIRTH:  February 25, 1914  DATE OF CONSULTATION:  09/14/2014  PHYSICIAN REQUESTING CONSULTATION:  Leonie Douglas. Doy Hutching, MD CONSULTING PHYSICIAN:  Mardene Sayer, MD  HISTORY OF PRESENT ILLNESS: The patient is a 81 year old female who lives at [facility] in the independent living section. She was in the laundry room with her walker when she tripped over her neighbor, Lennette Bihari, with a wheelchair. She had immediate pain with pelvic and back pain. She was found to have left-sided pelvic fractures and lumbar compression fracture. She was admitted placement.   PAST MEDICAL HISTORY: Osteoporosis, hypothyroidism, hypertension, glaucoma.   MEDICATIONS: Please see Dr. Doy Hutching' history and physical.   ALLERGIES: None.   SOCIAL HISTORY: She is here with her 3 children, she is a widow. She lives in the assisted living section of [place].  FAMILY HISTORY: Noncontributory.   REVIEW OF SYSTEMS: Negative, other than as stated in the HPI.   PHYSICAL EXAMINATION: GENERAL: The patient is elderly, in no acute distress. She is awake, alert, oriented, conversant, appropriate, with no evidence of dementia. She is quite sharp.  VITAL SIGNS: Stable.  HEAD: Atraumatic.  CHEST: Equal and symmetric.  ABDOMEN: Nontender.  PELVIS: Stable. She is tender to palpation posteriorly over the sacrum, and anteriorly over the left hemipelvis. She is also tender in the midline of the spine in the mid lumbar region. She has well-healed lateral surgical scars.   RADIOGRAPHS: Plain film x-ray and CT are reviewed. She has a left root of ramus superior ramus fracture and  inferior pubic ramus fracture. She also has a nondisplaced left sacral ala fracture. There is a compression fracture of the L3 vertebrae with moderate loss of height.   ASSESSMENT: A 81 year old female with multiple pelvic fractures, stable.   PLAN:  1.  Weightbearing as tolerated to the bilateral lower extremities.  These pelvic fractures and lumbar spine fractures are all stable fractures, and although she may hurt, they do not require surgery, immobilization, or limited weightbearing. These are all fragility fractures and will heal with time.  2.  She may follow up with the [facility] in 6 weeks for routine followup.   I did discuss in detail the nature of the diagnosis with the family and the plan for followup.   60 minutes spent on this consultation with thirty minutes spent face-to-face discussing nonoperative management of fragility fractures.  CPT 74128   ____________________________ Mardene Sayer, MD pa:LT D: 09/14/2014 12:11:11 ET T: 09/14/2014 16:19:20 ET JOB#: [id number]  cc: Mardene Sayer, MD, <Dictator> Raelyn Ensign. Stephanie Acre, MD PhD Orthopaedic Surgery ELECTRONICALLY SIGNED 09/14/2014 20:15

## 2015-04-04 NOTE — H&P (Signed)
PATIENT NAME:  [name], SCHROETER MR#:  [id number] DATE OF BIRTH:  [birth year]  DATE OF ADMISSION:  09/25/2014  PRIMARY CARE PHYSICIAN: Dr. Rosanna Randy  CHIEF COMPLAINT: Lower back pain and difficulty ambulating.   HISTORY OF PRESENT ILLNESS: This is a 81 year old female who presents to the Emergency Room from a skilled nursing facility due to lower back pain and having difficulty ambulating. The patient was recently discharged to [facility] after a recent fall and suffering a pelvic fracture. She was getting physical therapy at [facility] skilled nursing facility. She is was actually doing pretty well with the physical therapy until the past 2 days where she started developing worsening lower back pain and difficulty ambulating. She was therefore sent over to the ER for further evaluation. In the Emergency Room, the patient underwent a CT of her lumbar spine which showed bilateral nondisplaced transverse processes fracture of L5. She also has old compression fracture and she was also noted to have multilevel degenerative disk disease. Due to her significantly abnormal CT scan and her intractable back pain, hospitalist services were contacted for further treatment and evaluation.   REVIEW OF SYSTEMS: CONSTITUTIONAL: No documented fever. No weight gain or weight loss.  EYES: No blurred or double vision.  EARS, NOSE, THROAT: No tinnitus. No postnasal drip. No redness of the oropharynx.  RESPIRATORY: No cough, no wheeze, no hemoptysis, no dyspnea.  CARDIOVASCULAR: No chest pain, no orthopnea, no palpitations, no syncope.  GASTROINTESTINAL: No nausea, no vomiting, no diarrhea, no abdominal pain, no melena or hematochezia.  GENITOURINARY: No dysuria or hematuria.  ENDOCRINE: No polyuria or nocturia. No heat or cold intolerance.  HEMATOLOGIC: No anemia, no bruising, no bleeding.  INTEGUMENTARY: No rashes or lesions.  MUSCULOSKELETAL: Positive for arthritis. No gout. Positive back pain.  NEUROLOGIC: No numbness, no  tingling, no ataxia, no seizure-type activity.  PSYCHIATRIC: No anxiety. No insomnia. No ADD.   PAST MEDICAL HISTORY: Consistent with hypertension, hypothyroidism, osteoporosis, glaucoma, history of recent fall and pelvic fracture.   ALLERGIES: No known drug allergies.   SOCIAL HISTORY: No smoking. No alcohol abuse. No illicit drug abuse. Currently resides at a skilled nursing facility.   FAMILY HISTORY: Mother and father are both deceased. Blood pressure and heart disease runs in mother's side of the family. On father's side of the family there is more cancer.   CURRENT MEDICATIONS: Tylenol 650 four times daily as needed, Tylenol with hydrocodone 5/325 one to two 2 tabs q. 4 hours as needed, aspirin 325 mg b.i.d., calcium with vitamin D 1 tablet daily, multivitamin daily, levobunolol 0.5% ophthalmic solution 1 drop daily, Synthroid 50 mcg daily, methocarbamol 1000 mg q. 6 hours as needed, metoprolol tartrate 12.5 mg daily, milk of magnesia as needed, Norvasc 5 mg daily, oxycodone 5 mg t.i.d. as needed, Protonix 40 mg b.i.d., ramipril 5 mg daily, vitamin C 500 mg daily and vitamin D3 2000 international units daily.   PHYSICAL EXAMINATION:  VITAL SIGNS: Temperature 98, pulse 65, respirations 18, blood pressure 161/71, sats 96% on room air.  GENERAL: The patient is a pleasant-appearing female in no apparent distress.  HEAD, EYES, EARS, NOSE AND THROAT: Atraumatic, normocephalic. Extraocular muscles are intact. Pupils equal and reactive to light. Sclerae anicteric. No conjunctival injection. No pharyngeal erythema.  NECK: Supple. There is no jugular venous distention. No bruits, no lymphadenopathy and no thyromegaly.  HEART: Regular rate and rhythm. No murmurs, no rubs, no clicks.  LUNGS: Clear to auscultation bilaterally. No rales or rhonchi. No wheezes.  ABDOMEN: Soft, flat,  nontender, nondistended. Has good bowel sounds. No hepatosplenomegaly appreciated.  EXTREMITIES: No evidence of any  cyanosis, clubbing, or peripheral edema. Has +2 pedal and radial pulses bilaterally.  NEUROLOGICAL: The patient is alert, awake, and oriented x3 with no focal motor or sensory deficits distribution bilaterally.  SKIN: Moist and warm with no rashes appreciated.  LYMPHATIC: There is no cervical or axillary lymphadenopathy.  DIAGNOSTIC DATA: Serum glucose 101, BUN 16, creatinine 0.6, sodium 131, potassium 4.3, chloride 98, bicarb 28. LFTs are within normal limits. White cell count 10.9, hemoglobin 14.2, hematocrit 44, platelet count 279,000.   The patient also had a CT scan of the lumbar spine done without contrast which shows previously noted insufficiency fracture of the left sacral area. However, there is a new insufficiency fracture through the right sacral area. There are new bilateral nondisplaced transverse processes fracture at L5, old compression fracture of L3, and multilevel degenerative disk disease and lumbar spondylosis, most severe at L4-L5.   ASSESSMENT AND PLAN: This is a 81 year old female with a history of hypertension, hypothyroidism, recent pelvic fracture, osteoporosis, and glaucoma who presents to the hospital due to intractable back pain and difficulty ambulating and noted to have a lumbar spine transverse processes fracture.  1.  Back pain and difficulty ambulating. This is likely secondary to the lumbar spine transverse processes fractures and also spondylosis in the lower lumbar area. I will give the patient IV morphine for pain control, also give oral Percocet for breakthrough pain. We will get an orthopedic consult. We will get a physical therapy consult to assess her mobility and continue supportive care for now.  2.  Hypertension. The patient is presently hemodynamically stable. I will continue her ramipril and metoprolol. 3.  Hypothyroidism. Continue Synthroid.  4.  Glaucoma. Continue with her Betagan eyedrops.   The patient is a DNI/DNR.   TIME SPENT ON ADMISSION: 45  minutes.   ____________________________ Belia Heman. Verdell Carmine, MD vjs:sb D: 09/25/2014 15:29:20 ET T: 09/25/2014 16:32:34 ET JOB#: [id number]  cc: Belia Heman. Verdell Carmine, MD, <Dictator> Henreitta Leber MD ELECTRONICALLY SIGNED 10/20/2014 10:58

## 2015-04-04 NOTE — Consult Note (Signed)
Brief Consult Note: Diagnosis: Pelvic fractures, L3 fracture.   Patient was seen by consultant.   Consult note dictated.   Comments: 81 y/o female with stable pelvic and lumbar compression fractures.  Weightbearing as tolerated to BLE. Up as tolerated. No need for surgery, bracing or restrictions in weight-bearing. Pain control and placement per primary service. Follow-up [facility] in 6 weeks.  Electronic Signatures: Mardene Sayer (MD)  (Signed 04-Oct-15 12:13)  Authored: Brief Consult Note   Last Updated: 04-Oct-15 12:13 by Mardene Sayer (MD)

## 2015-04-04 NOTE — Consult Note (Signed)
PATIENT NAME:  [name], [name] MR#:  [id number] DATE OF BIRTH:  October 27, 1914  DATE OF CONSULTATION:  09/25/2014  REFERRING PHYSICIAN:  Abel Presto, MD (hospitalist).    CONSULTING PHYSICIAN:  James P. Holley Bouche., MD  CHIEF COMPLAINT: Low back pain.   HISTORY OF PRESENT ILLNESS: The patient is a 81 year old female who fell 12 days ago while ambulating with her walker. She presented to [facility] Emergency Room at that time complaining of pelvic and low back pain. Plain radiographs and subsequent CT scan demonstrated a left inferior and superior pubic rami fractures as well as left sacral ala fracture. An old L3 compression fracture was noted as well. The patient underwent initial physical therapy in house and was subsequently discharged to a skilled nursing facility. She had progressed with physical therapy and was actually ambulating with a walker until the last 24 hours when she had the spontaneous increase of lower back pain and has been subsequently unable to sit or stand due to severe lower back pain. She denies any radiation of pain down the legs. She denies any numbness. The pain was so intense that she was subsequently transferred from the skilled nursing facility to the Emergency Department at which time she required IV narcotics in an attempt to control the pain. CT scan obtained today demonstrated a new right sacral ala fracture.   PAST MEDICAL HISTORY: Osteoporosis, hypothyroidism, hypertension, glaucoma and status post hysterectomy.   PAST SURGICAL HISTORY: Hysterectomy.   ALLERGIES: No known drug allergies.   MEDICATIONS: At the time of admission Tylenol 650 mg q.i.d. p.r.n., Norco 5/325, 1 to 2 tablets q. 4 hours p.r.n. pain, aspirin 325 mg b.i.d., calcium with vitamin D 1 tablet daily, multivitamin daily, Levobunolol 0.25%  ophthalmic solution one drop daily, Synthroid 50 mcg daily, methocarbamol 1000 mg q. 6 hours p.r.n. spasm, metoprolol tartrate 12.5 mg daily, MOM  p.r.n. constipation,  Norvasc 5 mg daily, oxycodone 5 mg t.i.d. as needed for severe pain, Protonix 40 mg b.i.d., ramipril 5 mg daily, vitamin D 500 mg daily, vitamin D3 2000 international units daily.   SOCIAL HISTORY: The patient denies current tobacco or alcohol use. She was living independently at [facility], but more recently has been at a skilled nursing facility due to the above-mentioned fractures. Son and daughter lives out of town, but are not currently present.   FAMILY HISTORY: Positive for hypertension and cardiac disease on the mother's side of the family. Also history of cancer on father's side of the family.   REVIEW OF SYSTEMS: Pertinent musculoskeletal review of systems significant for lower back pain. No radicular symptoms. No significant bowel or bladder dysfunction.   PHYSICAL EXAMINATION:  GENERAL: The patient is a pleasant, well-developed, well-nourished female who appears younger than her stated age, seen lying supine in the bed. She demonstrates some guarding with attempt at bed mobility.  HEENT: Atraumatic, normocephalic. Extraocular motion is clear.  OROPHARYNX: Clear with moist mucosa.  NECK: Supple, nontender.  EXTREMITIES: Upper extremities: Good range of motion and strength in the upper extremities. Lower extremities; The patient reports pain with attempt at active or passive range of motion of both legs. She demonstrates significant guarding. No tenderness to palpation about the thigh or lower legs. No tenderness to the knees. No knee effusion.  SPINE: There is tenderness to palpation about the lower lumbar area and sacral region. Some  paraspinous spasm is noted. Flip test was not attempted due to severity of the pain.  NEUROLOGIC: Awake, alert, and oriented. Sensory function  is intact in all 4 extremities. Good motor strength is noted to the upper extremities. The patient demonstrates good concentric firing of the calf and thigh musculature but active range of motion and strength was not  assessed due to the injury. Good fine motor coordination is appreciated. No clonus or tremor.   IMAGING: CT scan: CT scan of the lumbar spine obtained at [facility] earlier today was reviewed. Again of note is an old L3 compression fracture. There appear to be acute nondisplaced fractures through the transverse processes of L5 bilaterally. Spondylolisthesis of the T12 on L1 is noted, as well as of L4 on L5. Severe degenerative disk disease is noted throughout the lumbar spine. Also of note is the previous insufficiency fracture to the left sacral ala nail with evidence of a new insufficiency fractures of the right sacral ala. These fractures are nondisplaced.   IMPRESSION:  1. Right and left sacral ala fractures.   2. Nondisplaced fractures of the transverse processes of L5 bilaterally, left superior and inferior pubic rami fractures, severe osteoporosis.   PLAN: The findings were discussed in detail with the patient and her family. We discussed the need for adequate pain control. Medications were added so as to minimize her risk of constipation or impaction given the immobility and narcotics. She would benefit from a gradual progression of bed mobility, as well as isometric exercises as per physical therapy. Given the severity of her pain, adequate analgesia is necessary and I would anticipate a slow progress with her mobility. She has increased risk for pulmonary complications as well as problems with skin integrity due to the relative immobility. I believe that the degree and range for injuries are appropriate for full admission.    ____________________________ Laurice Record. Holley Bouche., MD jph:JT D: 09/25/2014 22:25:49 ET T: 09/25/2014 22:44:04 ET JOB#: [id number]  cc: Laurice Record. Holley Bouche., MD, <Dictator> JAMES P Holley Bouche MD ELECTRONICALLY SIGNED 10/02/2014 19:50

## 2015-04-04 NOTE — H&P (Signed)
PATIENT NAME:  [name], [name] MR#:  [id number] DATE OF BIRTH:  1914-06-21  DATE OF ADMISSION:  09/13/2014  REFERRING PHYSICIAN: Rodrigo Ran, PA  FAMILY PHYSICIAN: Richard L. Rosanna Randy, MD   REASON FOR ADMISSION: Pelvic fracture.   HISTORY OF PRESENT ILLNESS: The patient is a 81 year old female who resides at [facility] in independent living. She was ambulating with a walker earlier this evening and fell. She presented with pelvic and back pain. In the Emergency Room, the patient was noted to have a pelvic fracture and a lumbar compression fracture. She is unable to ambulate due to pain and weakness. She is now admitted for further evaluation.   PAST MEDICAL HISTORY:  1.  Osteoporosis.  2.  Hypothyroidism.  3.  Benign hypertension.  4.  Glaucoma.  5.  Status post hysterectomy.   MEDICATIONS:  1.  Altace 5 mg p.o. daily.  2.  Protonix 40 mg p.o. b.i.d.  3.  Norvasc 5 mg p.o. daily.  4.  Lopressor 25 mg p.o. daily. 5.  Synthroid 50 mcg p.o. daily.  6.  Multivitamin 1 p.o. daily.   ALLERGIES: No known drug allergies.   SOCIAL HISTORY: Negative for alcohol or tobacco abuse.   FAMILY HISTORY: Positive for hypertension and coronary artery disease.  REVIEW OF SYSTEMS:  CONSTITUTIONAL: No fever or change in weight.  EYES: No blurred or double vision. No headaches.  ENT: No tinnitus or hearing loss. No nasal discharge or bleeding.  RESPIRATORY: No cough or wheezing. Denies hemoptysis.  CARDIOVASCULAR: No chest pain or palpitations. No syncope.  GASTROINTESTINAL: No nausea, vomiting, or diarrhea. No abdominal pain.  GENITOURINARY: No dysuria or hematuria. No incontinence.  ENDOCRINE: No polyuria or polydipsia. No heat or cold intolerance.  HEMATOLOGIC: The patient denies anemia, easy bruising, or bleeding.  LYMPHATIC: No swollen glands.  MUSCULOSKELETAL: The patient has pain in her neck, shoulders, or knees. No gout.  NEUROLOGIC: No numbness or migraines. Denies stroke or seizures.   PSYCHIATRIC: The patient denies anxiety, insomnia, or depression.   PHYSICAL EXAMINATION:  GENERAL: The patient is elderly, in no acute distress.  VITAL SIGNS: Currently remarkable for a blood pressure of 186/99, heart rate 68, respiratory rate of 14, temperature of 98.3, saturation 97% on room air.  HEENT: Normocephalic, atraumatic. Pupils equally round and reactive to light and accommodation. Extraocular movements are intact. Sclerae are not icteric. Conjunctivae are clear. Oropharynx is clear.  NECK: Supple without JVD. No lymphadenopathy or thyromegaly is noted.  LUNGS: Clear to auscultation and percussion without wheezes, rales, or rhonchi. No dullness.  CARDIAC: Regular rate and rhythm with normal S1, S2. No significant rubs or gallops. PMI is nondisplaced. Chest wall is nontender.  ABDOMEN: Soft, nontender, with normoactive bowel sounds. No organomegaly or masses were appreciated. No hernias or bruits were noted.  EXTREMITIES: Without clubbing, cyanosis, or edema. Pulses were 2+ bilaterally.  SKIN: Warm and dry without rash or lesions.  NEUROLOGIC: Revealed cranial nerves II through XII grossly intact. Deep tendon reflexes were symmetric. Motor and sensory exam is nonfocal.  PSYCHIATRIC: Revealed a patient who is alert and oriented to person, place, and time. She was cooperative and used good judgment.   LABORATORY DATA: EKG revealed sinus rhythm with no acute ischemic changes. Lumbar films revealed an acute L3 compression fracture. Pelvic CT revealed a pelvic fracture as well. Her white count was 13.5 with a hemoglobin of 15.0. Glucose 119 with a BUN of 16, creatinine of 0.78 with a GFR of greater than 60. Her  sodium was 133.   ASSESSMENT:  1.  Pelvic fracture.  2.  L3 compression fracture.  3.  Ambulation dysfunction.  4.  Osteoporosis.  5.  Hyponatremia.  6.  Benign hypertension.  7.  Hypothyroidism.   PLAN: The patient will be observed on orthopedics for pain management. We  will begin empiric IV fluids and IV antibiotics. A urine culture has been sent. We will consult orthopedics and physical therapy. We will obtain a social work consult, as the patient may require short-term rehabilitation. Follow up routine labs in the morning. Further treatment and evaluation will depend upon the patient's progress.  TOTAL TIME SPENT ON THIS PATIENT: 45 minutes.   ____________________________ Leonie Douglas Doy Hutching, MD jds:ST D: 09/13/2014 20:31:19 ET T: 09/13/2014 21:57:27 ET JOB#: [id number]  cc: Leonie Douglas. Doy Hutching, MD, <Dictator> Richard L. Rosanna Randy, MD Mackinsey Pelland Lennice Sites MD ELECTRONICALLY SIGNED 09/14/2014 14:00

## 2015-04-04 NOTE — Discharge Summary (Signed)
PATIENT NAME:  [name], [name] MR#:  [id number] DATE OF BIRTH:  [birth year]  DATE OF ADMISSION:  09/13/2014 DATE OF DISCHARGE:  09/15/2014  DISCHARGE DIAGNOSES:  1.  Pelvic fracture with ambulatory dysfunction; left pubic ramus fracture.  2.  Hyponatremia.  3.  Hypothyroidism.   DISCHARGE MEDICATIONS:  1.  Levothyroxine 50 mcg p.o. daily.  2.  Pantoprazole 40 mg p.o. b.i.d.  3.  Ramipril 5 mg p.o. daily.  4.  Centrum Silver 1 tablet daily.  5.  Caltrate with Vitamin D 1 tablet daily.  6.  Norvasc 5 mg p.o. daily.  7.  Percocet 5/325 mg 1 tablet every 4 hours as needed for  pain.  8.  Metoprolol 25 mg a half tablet daily.   DIET: Low sodium diet.   CONSULTATIONS: Physical therapy consult and orthopedic consult.   HOSPITAL COURSE: The patient is a 81 year old female patient who resides at [place]. Had a fall, and the patient suffered pelvic pain and back pain, unable to ambulate. The patient's lumbar x-rays showed L3 compression fracture and left superior and inferior ramus fractures. The patient was placed on observation status because of the pelvic pain and back pain, and the ambulatory dysfunction. The patient was seen by Dr. Buckner Malta from orthopedics and he recommended weightbearing as tolerated to the legs and did not recommend any surgery for the fractures. The patient can follow with  ortho  in 6 weeks. The patient did have a lot of pain yesterday, but today she is able to sit with the help of therapy in the chair. The patient was seen by the physical therapist. She still has some pain in the left hip area and the left leg, but she says it is better today, and physical therapy says she can go to short-term rehab. She is a private pay. The patient was accepted/bed offered at [facility]. The patient will go there.  OTHER DIAGNOSES INCLUDE:  1.  Hyponatremia, which is very mild. She had a sodium of 133 when she came, which improved with fluids.  2.  She was  thought to have a urinary tract infection, but urine cultures were negative and leukocyte esterase was negative on admission, so I stopped the Rocephin.  3.  Hypertension. Blood pressure was initially 187/82, but improved to 153/77. She is on Norvasc and heart rate is around 56, so decreased the metoprolol to a half tablet daily of 25 mg.   The patient's family is very involved in her care.     DISCHARGE VITALS: Temperature 98, heart rate 56, blood pressure 153/77, saturations 92% on room air.  The patient will go to [facility] today.   Her primary doctor is Dr. Miguel Aschoff.  TIME SPENT: More than 30 minutes.   ____________________________ Epifanio Lesches, MD sk:MT D: 09/15/2014 12:10:00 ET T: 09/15/2014 13:28:38 ET JOB#: [id number]  cc: Laurice Record. Holley Bouche., MD Dr. Miguel Aschoff - primary care physician Epifanio Lesches, MD, <Dictator>   Epifanio Lesches MD ELECTRONICALLY SIGNED 10/04/2014 18:02

## 2015-04-05 NOTE — Consult Note (Signed)
PATIENT NAME:  [name], [name] MR#:  [id number] DATE OF BIRTH:  1914-10-30  DATE OF CONSULTATION:  04/15/2012  REFERRING PHYSICIAN:   CONSULTING PHYSICIAN:  Ana Dawn. Armenta Erskin, MD  REASON FOR REFERRAL: Abdominal pain, rectal bleeding.   DESCRIPTION: The patient is a 81 year old white female who looks much younger than her age who came to the Emergency Room with rectal bleeding. Around 9:00 last night started having low abdominal cramping followed by diarrhea. Then she started having gross rectal bleeding afterwards. She then decided to come to the Emergency Room for further evaluation. The decision was made to admit the patient for further evaluation.   Until last night she was having no gastrointestinal symptoms at all. There were no fevers or chills. No fatigue or weakness. There was no chest pain or palpitations. There was no cough, no shortness of breath. There are no visual or hearing changes. There are no urinary symptoms. The rest of the review of symptoms was negative. She had no upper gastrointestinal symptoms at all.  PAST MEDICAL HISTORY: Notable for hypertension, osteoporosis, and hypothyroidism. She does have a history of esophagitis and duodenal ulcer in the past.   PAST SURGICAL HISTORY:  Hysterectomy, breast lumpectomy, and cataract surgery.   SOCIAL HISTORY: She does not smoke or drink.  She lives alone at home.   FAMILY HISTORY: Notable for hypertension and lung cancer.   ADMISSION MEDICATIONS:  Metoprolol, Protonix, ramipril, and levothyroxine.   ALLERGIES: No known drug allergies.   PHYSICAL EXAMINATION:  GENERAL: The patient is in no acute distress.   VITAL SIGNS: She is afebrile with temperature 97.4, pulse 71, respirations 17, blood pressure 166/94, pulse oximetry is 97% on room air.   HEENT: Normocephalic, atraumatic head. Pupils are equally reactive. Throat was clear.   NECK: Supple.   CARDIAC: Regular rhythm and rate.   LUNGS: Clear bilaterally.   ABDOMEN:  Normoactive bowel sounds, soft. There was some mild tenderness in the lower abdomen. There is no rebound or guarding. There is no hepatomegaly. There are no palpable masses.   EXTREMITIES: No clubbing, cyanosis, or edema.   NEUROLOGICAL: Nonfocal.   SKIN: Grossly normal.   LABS/STUDIES: Sodium 134, potassium 4.1. BUN 20, creatinine 0.8, glucose 137, lipase 180. Liver enzymes were normal. White count is elevated at 14,000, hemoglobin 15.6, hematocrit 46, platelet count 210. INR 1. CT scan of the abdomen shows some mucosal thickening of the descending colon that extends to the rectum.   IMPRESSION: This is a patient with acute bout of abdominal pain and bleeding with CT scan showing evidence of colitis. I suspect that she has ischemic colitis which usually resolves on its own. She could have infectious colitis, but that is less likely. She did have an ascending colon polyp removed by Dr. Vira Agar in 2004.  For now I recommend conservative treatment with antibiotic use. We need to do stool studies to make sure there is no infection going on. Hopefully, the symptoms resolve on their own over the next few days. However, if they do not then we will need to schedule the colonoscopy to evaluate the left side of the colon, but also check for any recurrence of polyps. Thank you for the referral.   ____________________________ Ana Dawn. Candace Cruise, MD pyo:bjt D: 04/16/2012 13:13:53 ET T: 04/16/2012 13:28:07 ET JOB#: [id number] Ana Dawn Shadae Reino MD ELECTRONICALLY SIGNED 04/16/2012 15:36

## 2015-04-05 NOTE — Consult Note (Signed)
Pt seen and examined. Full consult to follow. Acute bout of lower abdominal cramping around 9 PM last night with diarrhea, followed by rectal bleeding. CT shows inflammation in descending colon area. Had an ascending colon polyp removed by Dr. Vira Agar in 2004. Pt likely has ischemic colitis. Infectious colitis less likely. Agree with Abx and stool studies. Hopefully, sxs will resolve on own over next few days. If not, will need schedule colonoscopy for her. Will follow. Thanks.   Electronic Signatures: Verdie Shire (MD) (Signed on 05-May-13 10:33)  Authored   Last Updated: 06-May-13 13:05 by Verdie Shire (MD)

## 2015-04-05 NOTE — H&P (Signed)
PATIENT NAME:  [name], [name] MR#:  [id number] DATE OF BIRTH:  1913-12-25  DATE OF ADMISSION:  04/15/2012  PRIMARY CARE PHYSICIAN: Dr. Miguel Aschoff   CHIEF COMPLAINT: Rectal bleeding.   HISTORY OF PRESENT ILLNESS: [name] is a 81 year old pleasant Caucasian female who looks much younger than her stated age, lives independently an active life doing well. The patient came to the Emergency Department for evaluation of rectal bleeding. Her symptoms started around 9 p.m. last night with abdominal cramping like feeling. Thereafter about one hour later she started to have bright red blood per rectum associated with watery diarrhea. She had an episode every 15 to 20 minutes. At that time she decided to come to the Emergency Department for evaluation. She denies having any fever. No vomiting. No syncope. No palpitation. No chest pain. No shortness of breath. Her initial hemoglobin was normal. The patient was admitted for further evaluation of her rectal bleeding.   REVIEW OF SYSTEMS: CONSTITUTIONAL: Denies any fever. No chills. No night sweats. No fatigue. EYES: No blurring of vision. No double vision. ENT: No hearing impairment. No sore throat. No dysphagia. CARDIOVASCULAR: No chest pain. No shortness of breath. No edema. No syncope. RESPIRATORY: No shortness of breath. No cough. No sputum production. No hemoptysis. GASTROINTESTINAL: Reports crampy abdominal pain and rectal bleeding. No vomiting. No coffee-ground emesis. GENITOURINARY: No dysuria. No frequency of urination. MUSCULOSKELETAL: No joint pain or swelling. No muscular pain or swelling. INTEGUMENTARY: No skin rash. No ulcers. NEUROLOGY: No focal weakness. No seizure activity. No headache. PSYCHIATRY: No anxiety. No depression. ENDOCRINE: No polyuria or polydipsia. No heat or cold intolerance.   PAST MEDICAL HISTORY:  1. Systemic hypertension.  2. Hypothyroidism.  3. Osteoporosis. 4. Past history of bleeding duodenal ulcer and esophagitis.    PAST SURGICAL HISTORY:  1. Cesarean section. 2. Hysterectomy. 3. Breast lumpectomy. 4. Cataract surgery.   SOCIAL HABITS: Nonsmoker. No history of alcohol abuse.   SOCIAL HISTORY: She is widowed. Lives at home alone.   FAMILY HISTORY: Her grandfather suffered from hypertension. She has one aunt who died from lung cancer.   ADMISSION MEDICATIONS:  1. Ramipril 5 mg a day. 2. Protonix 40 mg twice a day. 3. Metoprolol 25 mg twice a day  4. Levothyroxine 50 mcg once a day.   ALLERGIES: No known drug allergies.   PHYSICAL EXAMINATION:   VITAL SIGNS: Blood pressure 195/99, respiratory rate 20, pulse 80, temperature 98, oxygen saturation 98% on room air.   GENERAL APPEARANCE: Elderly lady laying in bed comfortably in no acute distress.   HEAD: No pallor. No icterus. No cyanosis.   EARS, NOSE, AND THROAT: Hearing was normal. Nasal mucosa, lips, tongue were normal.   EYES: Normal iris and conjunctivae. Pupils about 4 to 5 mm very sluggishly reactive to light.   NECK: Supple. Trachea at midline. No thyromegaly. No cervical lymphadenopathy. No masses.   HEART: Normal S1, S2. No S3, S4. No murmur. No gallop. No carotid bruits.   RESPIRATORY: Normal breathing pattern without use of accessory muscles. No rales. No wheezing.   ABDOMEN: Soft without tenderness. No hepatosplenomegaly. No masses. No hernias.   MUSCULOSKELETAL: No joint swelling. No clubbing.   SKIN: No ulcers. No subcutaneous nodules.   NEUROLOGIC: Cranial nerves II through XII are intact. No focal motor deficit.   PSYCHIATRY: The patient is alert and oriented x3. Mood and affect were normal.   LABORATORY, DIAGNOSTIC, AND RADIOLOGICAL DATA: Serum glucose 137, BUN 20, creatinine 0.8, sodium 134, potassium 4.1, lipase  180. Liver function tests were normal. CBC showed elevated white blood cell count at 14,000, hemoglobin 15.6, hematocrit 46, platelet count 210. Prothrombin time normal at 13 with INR of 1 and a PTT of  28. Lactic acid is elevated at 1.5.   CAT scan of the abdomen showed mucosal thickening which originates in the mid descending colon and extends through the rectum. Appearance is most consistent with an infectious colitis although ischemic colitis is also a possibility. Incidental findings of atherosclerotic disease and degenerative arthritis of the spine. Also, there was a comment on a focal enhancing lesion in the spleen but incomplete on this single phase exam. Mild prominence of the pancreatic duct. No obstruction identified.   IMPRESSION:  1. Rectal bleeding. Differential diagnosis will include given the appearance of the CAT scan and thickening of the mucosa : ischemic colitis versus infectious colitis.  2. Incidental finding of enhancing lesion within the spleen of ? significance, but this study is considered incomplete because of single phase exam.  3. Severe systemic hypertension.  4. Hypothyroidism.  5. Past history of bleeding duodenal ulcer and history of esophagitis.  6. Osteoporosis. 7. Osteoarthritis.  8. History of hysterectomy. 9. Breast lumpectomy.   PLAN:  1. The patient was admitted to the medical floor.  2. Will monitor hemoglobin q.8 hours.  3. GI consultation.  4. Continue home medications as listed above.  5. I will give the patient one dose of amlodipine since blood pressure is markedly elevated.  6. Gentle IV hydration.  7.  Stool for culture and C. dif and start emperic IV antibiotic.  CODE STATUS: The patient is FULL CODE. I spoke with her regarding LIVING WILL. She does not have one but she gave the power-of-attorney to her son, [name].   TIME SPENT IN EVALUATING THIS PATIENT: More than 50 minutes.   ____________________________ Clovis Pu. Lenore Manner, MD amd:drc D: 04/15/2012 06:14:02 ET T: 04/15/2012 09:45:09 ET JOB#: [id number]  cc: Clovis Pu. Lenore Manner, MD, <Dictator> Richard L. Rosanna Randy, MD Clovis Pu Wilmont MD ELECTRONICALLY SIGNED 04/15/2012 22:19

## 2015-04-05 NOTE — Consult Note (Signed)
Chief Complaint:   Subjective/Chief Complaint Still having abd cramping and some bleednig. Stool tests neg.   VITAL SIGNS/ANCILLARY NOTES: **Vital Signs.:   06-May-13 12:57   Vital Signs Type Routine   Temperature Temperature (F) 97.4   Celsius 36.3   Temperature Source oral   Pulse Pulse 67   Pulse source per Dinamap   Respirations Respirations 18   Systolic BP Systolic BP 401   Diastolic BP (mmHg) Diastolic BP (mmHg) 68   Mean BP 99   BP Source Dinamap   Pulse Ox % Pulse Ox % 99   Pulse Ox Activity Level  At rest   Oxygen Delivery Room Air/ 21 %   Brief Assessment:   Cardiac Regular    Respiratory clear BS    Gastrointestinal mildly distended. mild abd tenderness   Routine Hem:  06-May-13 00:25    Hemoglobin (CBC) 13.4    05:11    WBC (CBC) 15.7   Hemoglobin (CBC) 13.1  Routine Chem:  06-May-13 05:11    Sodium, Serum 131   Assessment/Plan:  Assessment/Plan:   Assessment Colitis. Still with abd pain and bleeding.    Plan Continue Abx for now. Keep on liquid diet unless sxs improve. I will be at [facility]. Will check back on Wed. If remains symptomatic, then prep on Wed for colonoscopy Thurs. Thanks   Electronic Signatures: Verdie Shire (MD)  (Signed 06-May-13 14:57)  Authored: Chief Complaint, VITAL SIGNS/ANCILLARY NOTES, Brief Assessment, Lab Results, Assessment/Plan   Last Updated: 06-May-13 14:57 by Verdie Shire (MD)

## 2015-04-05 NOTE — Discharge Summary (Signed)
PATIENT NAME:  [name], [name] MR#:  [id number] DATE OF BIRTH:  12/21/1913  DATE OF ADMISSION:  04/15/2012 DATE OF DISCHARGE:  04/18/2012  ADMITTING DIAGNOSIS: Rectal bleeding.   DISCHARGE DIAGNOSES: 1. Ischemic colitis.  2. Rectal bleeding.  3. Acute posthemorrhagic anemia.  4. Hypertension poorly controlled.  5. Hyponatremia.  6. Dehydration. 7. Generalized weakness.   DISCHARGE CONDITION: Stable.   DISCHARGE MEDICATIONS:  1. Protonix 40 mg p.o. twice daily. 2. Levothyroxine 50 mcg p.o. daily.   ADDITIONAL MEDICATIONS:  1. Toprol-XL 100 mg p.o. daily.  2. Ramipril 10 mg p.o. daily.  3. Levaquin 500 mg p.o. daily for five more days. 4. Flagyl 500 mg p.o. 3 times daily for five more days.   HOME OXYGEN: None.   DIET: 2 grams salt, soft, advance to regular slowly over the next week.   ACTIVITY LIMITATIONS: As tolerated.   REFERRAL: Home health physical therapy, R.N. as well as aide.   FOLLOWUP:  1. Follow-up appointment with Dr. Rosanna Randy in two days after discharge  2. Follow-up appointment with Dr. Candace Cruise in 1 to 2 weeks after discharge.   CONSULTANT: Dr. Candace Cruise.  RADIOLOGIC STUDIES: CT of the abdomen and pelvis with contrast on 04/18/2012 revealing generalized thickening of the wall of the distal descending colon and rectosigmoid consistent with colitis, either infectious or conceivably ischemic. There is no significant diverticulosis. There is no evidence of perforation or abscess formation. There is mild liters of the small bowel. No acute hepatobiliary abnormality or acute urinary tract abnormality noted.   HISTORY: The patient is a 81 year old female with past medical history significant for history of ischemic colitis in the past who presented to the hospital with sudden onset of abdominal cramping and bright red blood per rectum. Please refer to Dr. Zacarias Pontes admission note on 04/15/2012.   PHYSICAL EXAMINATION:  On arrival to the hospital, the patient's vital signs showed  blood pressure of 195/99, respiratory rate 20, pulse 80, temperature 98, oxygen saturation was 98% on room air. Physical exam revealed no significant tenderness on abdominal examination.   LABORATORY, RADIOLOGICAL AND DIAGNOSTIC DATA: Lab data done on 04/15/2012 showed glucose 137, BUN 20, sodium 134, otherwise BMP was unremarkable. The patient's lipase level was normal at 180. Liver enzymes were normal. White blood cell count was elevated to 14.3, hemoglobin 15.6, platelet count 210. Coagulation panel was unremarkable. Stool cultures were negative for Salmonella, Shigella, Campylobacter or pathogenic Escherichia coli. It was also negative for white blood cells or C-diff antigen. The patient's lactic acid level was elevated to 1.5.   HOSPITAL COURSE: The patient was admitted to the hospital. She was given IV fluids as well as antibiotic therapy after CT scan was done and consultation with Dr. Candace Cruise was obtained. Dr. Candace Cruise saw the patient in consultation the same day, on 04/15/2012. He noted that the patient's acute bout of low abdominal cramping as well as diarrhea followed by rectal bleeding with CT showing inflammation descending colon area, also history of ascending colon polyp removed by Dr. Vira Agar in 2004 likely represents ischemic colitis. Infectious colitis, according to Dr. Candace Cruise, was less likely. He recommended to continue antibiotics as well as stool studies and felt that hopefully the patient's symptoms will resolve in the next few days, but if she if it does not resolve the patient will need to have a scheduled colonoscopy in the future. The patient was continued on IV fluids as well as antibiotics and continued to have a few days of abdominal cramping as well as  bleeding, however, symptoms subsided and progressively pain got better. Her last, cramping, as well as mild bleeding, was on 04/16/2012. She still continued to have some diarrhea by the day of discharge, however less so and stool was forming. The  patient is to continue antibiotic therapy for five more days to complete course. She is to follow up with Dr. Candace Cruise in the next few weeks after discharge. Of note, the patient's hemoglobin level was checked while she was in the hospital and initially on arrival to the hospital, the patient was noted to be somewhat hemoconcentrated with hemoglobin level of 15.6. With rehydration, the patient's hemoglobin level went down to 12.3 on the day of discharge, 04/18/2012. It was recommended to follow the patient's hemoglobin levels as outpatient and make recommendations for iron supplements if needed The patient did not require any transfusions.   For hypertension, the patient's blood pressure was poorly controlled. Her medications were advanced. Toprol-XL was changed to 100 mg daily dose and Ramipril to 10 mg p.o. daily dose. It is recommended to follow the patient's blood pressure and make decisions about change in medications if needed.   In regards to hyponatremia, which was noted on the day of admission, the patient was rehydrated and her sodium level improved from level of 134 to 138 on 04/17/2012. It was felt to be dehydration related.   In regards to the patient's chronic medical problems, she is to continue Levothyroxine as well as Protonix. The patient was evaluated by a physical therapist for generalized weakness. She was noted to have some instability whenever she walked. She received daily therapies and was felt to be appropriate for home health discharge. This was extensively discussed with the patient's son who was very concerned about her living alone and having minimal support. For this reason, home health physical therapy as well as RN and aide will be recommended for the patient upon discharge. The patient is being discharged in stable condition with above-mentioned medications and follow-up.   VITAL SIGNS ON THE DAY OF DISCHARGE: Temperature 97.9, pulse 69, respiration rate 18, blood pressure 150/72,  saturation 96% on room air at rest.   TIME SPENT: 40 minutes.   ____________________________ Theodoro Grist, MD rv:ap D: 04/18/2012 21:33:08 ET             T: 04/19/2012 13:36:44 ET              JOB#: [id number] cc: Theodoro Grist, MD, <Dictator> Richard L. Rosanna Randy, MD Theodoro Grist MD ELECTRONICALLY SIGNED 04/21/2012 17:02

## 2015-04-07 DIAGNOSIS — H16202 Unspecified keratoconjunctivitis, left eye: Secondary | ICD-10-CM | POA: Diagnosis not present

## 2015-04-22 DIAGNOSIS — H16202 Unspecified keratoconjunctivitis, left eye: Secondary | ICD-10-CM | POA: Diagnosis not present

## 2015-04-23 ENCOUNTER — Ambulatory Visit: Payer: Medicare Other | Admitting: Podiatry

## 2015-04-24 ENCOUNTER — Ambulatory Visit: Payer: Medicare Other

## 2015-04-28 DIAGNOSIS — I10 Essential (primary) hypertension: Secondary | ICD-10-CM | POA: Diagnosis not present

## 2015-04-28 DIAGNOSIS — E039 Hypothyroidism, unspecified: Secondary | ICD-10-CM | POA: Diagnosis not present

## 2015-04-28 DIAGNOSIS — K219 Gastro-esophageal reflux disease without esophagitis: Secondary | ICD-10-CM | POA: Diagnosis not present

## 2015-04-30 ENCOUNTER — Ambulatory Visit (INDEPENDENT_AMBULATORY_CARE_PROVIDER_SITE_OTHER): Payer: Medicare Other | Admitting: Podiatry

## 2015-04-30 DIAGNOSIS — M79673 Pain in unspecified foot: Secondary | ICD-10-CM

## 2015-04-30 DIAGNOSIS — B351 Tinea unguium: Secondary | ICD-10-CM | POA: Diagnosis not present

## 2015-04-30 DIAGNOSIS — L84 Corns and callosities: Secondary | ICD-10-CM

## 2015-04-30 NOTE — Progress Notes (Signed)
Patient ID: Glora Hulgan, female   DOB: June 12, 1914, 81 y.o.   MRN: [id number]  Subjective: 81 y.o.-year-old female returns the office today for painful, elongated, thickened toenails for which she is unable to trim herself. Denies any redness or drainage around the nails. Denies any acute changes since last appointment and no new complaints today. Denies any systemic complaints such as fevers, chills, nausea, vomiting.   Objective: AAO 3, NAD Neurovascular status unchagned.  Nails hypertrophic, dystrophic, elongated, brittle, discolored 10. There is tenderness overlying the nails 1-5 bilaterally. There is no surrounding erythema or drainage along the nail sites.  small hyperkeratotic lesion on the distal aspect of the right third toe and along the left submetatarsal one. Upon debridement no underlying ulceration, drainage or other clinical signs of infection. No open lesions or other pre-ulcerative lesions are identified. No other areas of tenderness bilateral lower extremities. No overlying edema, erythema, increased warmth. No pain with calf compression, swelling, warmth, erythema.  Assessment: Patient presents with symptomatic onychomycosis, hyperkerotic lesions  Plan: -Treatment options including alternatives, risks, complications were discussed -Nails sharply debrided 10 without complication/bleeding. -Hyperkerotic lesions sharply debrided x2 without complications/bleeding.  -Discussed daily foot inspection. If there are any changes, to call the office immediately.  -Follow-up in 3 months or sooner if any problems are to arise. In the meantime, encouraged to call the office with any questions, concerns, changes symptoms.

## 2015-05-15 DIAGNOSIS — H169 Unspecified keratitis: Secondary | ICD-10-CM | POA: Diagnosis not present

## 2015-06-17 ENCOUNTER — Telehealth: Payer: Self-pay | Admitting: Family Medicine

## 2015-06-17 ENCOUNTER — Other Ambulatory Visit: Payer: Self-pay

## 2015-06-17 MED ORDER — METOPROLOL TARTRATE 25 MG PO TABS: 50.0000 mg | ORAL_TABLET | Freq: Every day | ORAL | Status: DC

## 2015-06-17 MED ORDER — LEVOTHYROXINE SODIUM 50 MCG PO TABS: 50.0000 ug | ORAL_TABLET | Freq: Every day | ORAL | Status: DC

## 2015-06-17 MED ORDER — RAMIPRIL 5 MG PO CAPS: 5.0000 mg | ORAL_CAPSULE | Freq: Every day | ORAL | Status: DC

## 2015-06-17 NOTE — Telephone Encounter (Signed)
Patient's son called saying that patient will be out of medication before she will receive her mail order supply. Patient only has 1 tablet of each left. Patient's son [name]) is requesting that we send a refill into local pharmacy until patient is able to get her meds from mail order. Patient uses [facility]. [address] Thanks!

## 2015-06-26 DIAGNOSIS — H16202 Unspecified keratoconjunctivitis, left eye: Secondary | ICD-10-CM | POA: Diagnosis not present

## 2015-07-07 DIAGNOSIS — H16202 Unspecified keratoconjunctivitis, left eye: Secondary | ICD-10-CM | POA: Diagnosis not present

## 2015-07-24 DIAGNOSIS — H16202 Unspecified keratoconjunctivitis, left eye: Secondary | ICD-10-CM | POA: Diagnosis not present

## 2015-08-04 ENCOUNTER — Ambulatory Visit (INDEPENDENT_AMBULATORY_CARE_PROVIDER_SITE_OTHER): Payer: Medicare Other | Admitting: Podiatry

## 2015-08-04 DIAGNOSIS — M79673 Pain in unspecified foot: Secondary | ICD-10-CM | POA: Diagnosis not present

## 2015-08-04 DIAGNOSIS — B351 Tinea unguium: Secondary | ICD-10-CM

## 2015-08-04 DIAGNOSIS — L84 Corns and callosities: Secondary | ICD-10-CM

## 2015-08-04 NOTE — Progress Notes (Signed)
Patient ID: [name], female   DOB: September 18, 1914, 81 y.o.   MRN: [id number]  Subjective: 81 y.o.-year-old female returns the office today for painful, elongated, thickened toenails for which she is unable to trim herself. Denies any redness or drainage around the nails. Denies any acute changes since last appointment and no new complaints today. Denies any systemic complaints such as fevers, chills, nausea, vomiting.   Objective: AAO 3, NAD Neurovascular status unchagned.  Nails hypertrophic, dystrophic, elongated, brittle, discolored 10. There is tenderness overlying the nails 1-5 bilaterally. There is no surrounding erythema or drainage along the nail sites. Small hyperkeratotic lesion on the distal aspect of the right third toe and along bilateral submetatarsal one. Upon debridement no underlying ulceration, drainage or other clinical signs of infection. No open lesions or other pre-ulcerative lesions are identified. No other areas of tenderness bilateral lower extremities. No overlying edema, erythema, increased warmth. Significant HAV b/l; hammertoe contractures  No pain with calf compression, swelling, warmth, erythema.  Assessment: Patient presents with symptomatic onychomycosis, hyperkerotic lesions (sub 1, distal corn 2 R)  Plan: -Treatment options including alternatives, risks, complications were discussed -Nails sharply debrided 10 without complication/bleeding. -Hyperkerotic lesions sharply debrided x2 without complications/bleeding.  -Discussed daily foot inspection. If there are any changes, to call the office immediately.  -Follow-up in 3 months or sooner if any problems are to arise. In the meantime, encouraged to call the office with any questions, concerns, changes symptoms.   Celesta Gentile, DPM

## 2015-08-14 DIAGNOSIS — H16202 Unspecified keratoconjunctivitis, left eye: Secondary | ICD-10-CM | POA: Diagnosis not present

## 2015-09-30 DIAGNOSIS — Z961 Presence of intraocular lens: Secondary | ICD-10-CM | POA: Diagnosis not present

## 2015-10-02 DIAGNOSIS — Z961 Presence of intraocular lens: Secondary | ICD-10-CM | POA: Diagnosis not present

## 2015-10-12 DIAGNOSIS — H16202 Unspecified keratoconjunctivitis, left eye: Secondary | ICD-10-CM | POA: Diagnosis not present

## 2015-10-30 DIAGNOSIS — H16202 Unspecified keratoconjunctivitis, left eye: Secondary | ICD-10-CM | POA: Diagnosis not present

## 2015-11-03 ENCOUNTER — Ambulatory Visit: Payer: Self-pay

## 2015-11-03 ENCOUNTER — Ambulatory Visit: Payer: Medicare Other | Admitting: Sports Medicine

## 2015-11-03 DIAGNOSIS — H16202 Unspecified keratoconjunctivitis, left eye: Secondary | ICD-10-CM | POA: Diagnosis not present

## 2015-11-07 ENCOUNTER — Other Ambulatory Visit: Payer: Self-pay | Admitting: Family Medicine

## 2015-11-13 DIAGNOSIS — H16202 Unspecified keratoconjunctivitis, left eye: Secondary | ICD-10-CM | POA: Diagnosis not present

## 2015-11-19 ENCOUNTER — Other Ambulatory Visit
Admission: RE | Admit: 2015-11-19 | Discharge: 2015-11-19 | Disposition: A | Discharge status: Home / Self Care | Payer: Medicare Other | Source: Ambulatory Visit | Attending: Ophthalmology | Admitting: Ophthalmology

## 2015-11-19 DIAGNOSIS — H16202 Unspecified keratoconjunctivitis, left eye: Secondary | ICD-10-CM | POA: Diagnosis not present

## 2015-11-23 LAB — WOUND CULTURE

## 2015-11-26 DIAGNOSIS — H16002 Unspecified corneal ulcer, left eye: Secondary | ICD-10-CM | POA: Diagnosis not present

## 2015-11-27 ENCOUNTER — Other Ambulatory Visit
Admission: RE | Admit: 2015-11-27 | Discharge: 2015-11-27 | Disposition: A | Discharge status: Home / Self Care | Payer: Medicare Other | Source: Other Acute Inpatient Hospital | Attending: Ophthalmology | Admitting: Ophthalmology

## 2015-11-27 DIAGNOSIS — H10402 Unspecified chronic conjunctivitis, left eye: Secondary | ICD-10-CM | POA: Insufficient documentation

## 2015-12-01 ENCOUNTER — Other Ambulatory Visit
Admission: RE | Admit: 2015-12-01 | Discharge: 2015-12-01 | Disposition: A | Discharge status: Home / Self Care | Payer: Medicare Other | Source: Skilled Nursing Facility | Attending: Ophthalmology | Admitting: Ophthalmology

## 2015-12-01 DIAGNOSIS — H16002 Unspecified corneal ulcer, left eye: Secondary | ICD-10-CM | POA: Diagnosis not present

## 2015-12-01 DIAGNOSIS — H10402 Unspecified chronic conjunctivitis, left eye: Secondary | ICD-10-CM | POA: Insufficient documentation

## 2015-12-01 LAB — ANAEROBIC CULTURE

## 2015-12-01 LAB — EYE CULTURE

## 2015-12-02 DIAGNOSIS — H16032 Corneal ulcer with hypopyon, left eye: Secondary | ICD-10-CM | POA: Diagnosis not present

## 2015-12-04 DIAGNOSIS — H16032 Corneal ulcer with hypopyon, left eye: Secondary | ICD-10-CM | POA: Diagnosis not present

## 2015-12-05 LAB — WOUND CULTURE: CULTURE: NO GROWTH

## 2015-12-07 DIAGNOSIS — H16032 Corneal ulcer with hypopyon, left eye: Secondary | ICD-10-CM | POA: Diagnosis not present

## 2015-12-09 DIAGNOSIS — H16032 Corneal ulcer with hypopyon, left eye: Secondary | ICD-10-CM | POA: Diagnosis not present

## 2015-12-11 DIAGNOSIS — H16032 Corneal ulcer with hypopyon, left eye: Secondary | ICD-10-CM | POA: Diagnosis not present

## 2015-12-12 DIAGNOSIS — H16032 Corneal ulcer with hypopyon, left eye: Secondary | ICD-10-CM | POA: Diagnosis not present

## 2015-12-14 ENCOUNTER — Emergency Department: Payer: Medicare Other

## 2015-12-14 ENCOUNTER — Inpatient Hospital Stay
Admission: EM | Admit: 2015-12-14 | Discharge: 2015-12-21 | DRG: 184 | Disposition: A | Discharge status: Other Acute Inpatient Hospital | Payer: Medicare Other | Attending: Internal Medicine | Admitting: Internal Medicine

## 2015-12-14 ENCOUNTER — Encounter: Payer: Self-pay | Admitting: Emergency Medicine

## 2015-12-14 DIAGNOSIS — H44002 Unspecified purulent endophthalmitis, left eye: Secondary | ICD-10-CM | POA: Diagnosis not present

## 2015-12-14 DIAGNOSIS — S0532XA Ocular laceration without prolapse or loss of intraocular tissue, left eye, initial encounter: Secondary | ICD-10-CM | POA: Diagnosis not present

## 2015-12-14 DIAGNOSIS — S2241XA Multiple fractures of ribs, right side, initial encounter for closed fracture: Secondary | ICD-10-CM | POA: Diagnosis not present

## 2015-12-14 DIAGNOSIS — R51 Headache: Secondary | ICD-10-CM

## 2015-12-14 DIAGNOSIS — W19XXXA Unspecified fall, initial encounter: Secondary | ICD-10-CM

## 2015-12-14 DIAGNOSIS — I10 Essential (primary) hypertension: Secondary | ICD-10-CM | POA: Diagnosis present

## 2015-12-14 DIAGNOSIS — B958 Unspecified staphylococcus as the cause of diseases classified elsewhere: Secondary | ICD-10-CM | POA: Diagnosis present

## 2015-12-14 DIAGNOSIS — K219 Gastro-esophageal reflux disease without esophagitis: Secondary | ICD-10-CM | POA: Diagnosis not present

## 2015-12-14 DIAGNOSIS — Z79899 Other long term (current) drug therapy: Secondary | ICD-10-CM

## 2015-12-14 DIAGNOSIS — I959 Hypotension, unspecified: Secondary | ICD-10-CM | POA: Diagnosis present

## 2015-12-14 DIAGNOSIS — E039 Hypothyroidism, unspecified: Secondary | ICD-10-CM | POA: Diagnosis present

## 2015-12-14 DIAGNOSIS — E871 Hypo-osmolality and hyponatremia: Secondary | ICD-10-CM | POA: Diagnosis present

## 2015-12-14 DIAGNOSIS — H409 Unspecified glaucoma: Secondary | ICD-10-CM | POA: Diagnosis present

## 2015-12-14 DIAGNOSIS — R531 Weakness: Secondary | ICD-10-CM

## 2015-12-14 DIAGNOSIS — M545 Low back pain: Secondary | ICD-10-CM | POA: Diagnosis not present

## 2015-12-14 DIAGNOSIS — S299XXA Unspecified injury of thorax, initial encounter: Secondary | ICD-10-CM | POA: Diagnosis not present

## 2015-12-14 DIAGNOSIS — W1830XA Fall on same level, unspecified, initial encounter: Secondary | ICD-10-CM | POA: Diagnosis present

## 2015-12-14 DIAGNOSIS — S3992XA Unspecified injury of lower back, initial encounter: Secondary | ICD-10-CM | POA: Diagnosis not present

## 2015-12-14 DIAGNOSIS — S2239XA Fracture of one rib, unspecified side, initial encounter for closed fracture: Secondary | ICD-10-CM | POA: Diagnosis present

## 2015-12-14 DIAGNOSIS — E860 Dehydration: Secondary | ICD-10-CM | POA: Diagnosis present

## 2015-12-14 DIAGNOSIS — M25511 Pain in right shoulder: Secondary | ICD-10-CM | POA: Diagnosis not present

## 2015-12-14 DIAGNOSIS — Z7982 Long term (current) use of aspirin: Secondary | ICD-10-CM

## 2015-12-14 DIAGNOSIS — H16072 Perforated corneal ulcer, left eye: Secondary | ICD-10-CM

## 2015-12-14 DIAGNOSIS — S2231XA Fracture of one rib, right side, initial encounter for closed fracture: Secondary | ICD-10-CM

## 2015-12-14 DIAGNOSIS — M546 Pain in thoracic spine: Secondary | ICD-10-CM | POA: Diagnosis not present

## 2015-12-14 DIAGNOSIS — Z66 Do not resuscitate: Secondary | ICD-10-CM | POA: Diagnosis present

## 2015-12-14 DIAGNOSIS — Z90710 Acquired absence of both cervix and uterus: Secondary | ICD-10-CM

## 2015-12-14 DIAGNOSIS — M549 Dorsalgia, unspecified: Secondary | ICD-10-CM | POA: Diagnosis not present

## 2015-12-14 DIAGNOSIS — R519 Headache, unspecified: Secondary | ICD-10-CM

## 2015-12-14 DIAGNOSIS — H16032 Corneal ulcer with hypopyon, left eye: Secondary | ICD-10-CM | POA: Diagnosis not present

## 2015-12-14 HISTORY — DX: Fracture of unspecified parts of lumbosacral spine and pelvis, initial encounter for closed fracture: S32.9XXA

## 2015-12-14 HISTORY — DX: Unspecified purulent endophthalmitis, unspecified eye: H44.009

## 2015-12-14 HISTORY — DX: Unspecified fall, initial encounter: W19.XXXA

## 2015-12-14 HISTORY — DX: Repeated falls: R29.6

## 2015-12-14 LAB — COMPREHENSIVE METABOLIC PANEL
ALBUMIN: 3.6 g/dL (ref 3.5–5.0)
ALK PHOS: 63 U/L (ref 38–126)
ALT: 16 U/L (ref 14–54)
AST: 26 U/L (ref 15–41)
Anion gap: 8 (ref 5–15)
BILIRUBIN TOTAL: 0.8 mg/dL (ref 0.3–1.2)
BUN: 18 mg/dL (ref 6–20)
CALCIUM: 8.8 mg/dL — AB (ref 8.9–10.3)
CO2: 26 mmol/L (ref 22–32)
CREATININE: 0.53 mg/dL (ref 0.44–1.00)
Chloride: 94 mmol/L — ABNORMAL LOW (ref 101–111)
GFR calc Af Amer: >60 mL/min (ref >60)
GLUCOSE: 106 mg/dL — AB (ref 65–99)
POTASSIUM: 4 mmol/L (ref 3.5–5.1)
Sodium: 128 mmol/L — ABNORMAL LOW (ref 135–145)
TOTAL PROTEIN: 6.6 g/dL (ref 6.5–8.1)

## 2015-12-14 LAB — CBC
HEMATOCRIT: 38.3 % (ref 35.0–47.0)
HEMOGLOBIN: 12.9 g/dL (ref 12.0–16.0)
MCH: 30.3 pg (ref 26.0–34.0)
MCHC: 33.7 g/dL (ref 32.0–36.0)
MCV: 89.8 fL (ref 80.0–100.0)
Platelets: 263 10*3/uL (ref 150–440)
RBC: 4.26 MIL/uL (ref 3.80–5.20)
RDW: 13.1 % (ref 11.5–14.5)
WBC: 14 10*3/uL — AB (ref 3.6–11.0)

## 2015-12-14 LAB — URINALYSIS COMPLETE WITH MICROSCOPIC (ARMC ONLY)
BILIRUBIN URINE: NEGATIVE
Bacteria, UA: NONE SEEN
GLUCOSE, UA: NEGATIVE mg/dL
Hgb urine dipstick: NEGATIVE
Leukocytes, UA: NEGATIVE
Nitrite: NEGATIVE
PROTEIN: NEGATIVE mg/dL
SPECIFIC GRAVITY, URINE: 1.009 (ref 1.005–1.030)
pH: 7 (ref 5.0–8.0)

## 2015-12-14 MED ORDER — MORPHINE SULFATE (PF) 2 MG/ML IV SOLN
1.0000 mg | INTRAVENOUS | Status: DC | PRN
  Administered 2015-12-16 – 2015-12-21 (×4): 1 mg via INTRAVENOUS
  Filled 2015-12-14 (×4): qty 1

## 2015-12-14 MED ORDER — RAMIPRIL 5 MG PO CAPS
5.0000 mg | ORAL_CAPSULE | Freq: Every day | ORAL | Status: DC
  Administered 2015-12-14 – 2015-12-21 (×8): 5 mg via ORAL
  Filled 2015-12-14 (×8): qty 1

## 2015-12-14 MED ORDER — ACETAMINOPHEN 325 MG PO TABS
650.0000 mg | ORAL_TABLET | Freq: Four times a day (QID) | ORAL | Status: DC | PRN
  Administered 2015-12-14 – 2015-12-21 (×17): 650 mg via ORAL
  Filled 2015-12-14 (×18): qty 2

## 2015-12-14 MED ORDER — ONDANSETRON HCL 4 MG/2ML IJ SOLN: 4.0000 mg | Freq: Four times a day (QID) | INTRAMUSCULAR | Status: DC | PRN

## 2015-12-14 MED ORDER — NONFORMULARY OR COMPOUNDED ITEM
1.0000 [drp] | Freq: Four times a day (QID) | Status: DC
  Administered 2015-12-14 – 2015-12-21 (×25): 1 [drp] via OPHTHALMIC
  Filled 2015-12-14 (×11): qty 1

## 2015-12-14 MED ORDER — BISACODYL 5 MG PO TBEC
5.0000 mg | DELAYED_RELEASE_TABLET | Freq: Every day | ORAL | Status: DC | PRN
Start: 2015-12-14 — End: 2015-12-21

## 2015-12-14 MED ORDER — SODIUM CHLORIDE 0.9 % IV SOLN: 250.0000 mL | INTRAVENOUS | Status: DC | PRN

## 2015-12-14 MED ORDER — RAMIPRIL 10 MG PO CAPS
ORAL_CAPSULE | ORAL | Status: AC
  Filled 2015-12-14: qty 1

## 2015-12-14 MED ORDER — LEVOBUNOLOL HCL 0.5 % OP SOLN
1.0000 [drp] | Freq: Two times a day (BID) | OPHTHALMIC | Status: DC
  Filled 2015-12-14: qty 5

## 2015-12-14 MED ORDER — ONDANSETRON HCL 4 MG PO TABS
4.0000 mg | ORAL_TABLET | Freq: Four times a day (QID) | ORAL | Status: DC | PRN
  Administered 2015-12-16 – 2015-12-17 (×2): 4 mg via ORAL
  Filled 2015-12-14: qty 1

## 2015-12-14 MED ORDER — PREDNISOLONE ACETATE 1 % OP SUSP
1.0000 [drp] | Freq: Two times a day (BID) | OPHTHALMIC | Status: DC
  Administered 2015-12-14 – 2015-12-17 (×6): 1 [drp] via OPHTHALMIC
  Filled 2015-12-14: qty 1

## 2015-12-14 MED ORDER — LEVOBUNOLOL HCL 0.5 % OP SOLN
1.0000 [drp] | Freq: Two times a day (BID) | OPHTHALMIC | Status: DC
  Administered 2015-12-14 – 2015-12-21 (×13): 1 [drp] via OPHTHALMIC
  Filled 2015-12-14: qty 5

## 2015-12-14 MED ORDER — METOPROLOL TARTRATE 50 MG PO TABS
50.0000 mg | ORAL_TABLET | Freq: Every day | ORAL | Status: DC
  Administered 2015-12-14 – 2015-12-21 (×8): 50 mg via ORAL
  Filled 2015-12-14 (×8): qty 1

## 2015-12-14 MED ORDER — HYDROCODONE-ACETAMINOPHEN 5-325 MG PO TABS
1.0000 | ORAL_TABLET | Freq: Once | ORAL | Status: AC
  Administered 2015-12-14: 1 via ORAL
  Filled 2015-12-14: qty 1

## 2015-12-14 MED ORDER — PANTOPRAZOLE SODIUM 40 MG PO TBEC
40.0000 mg | DELAYED_RELEASE_TABLET | Freq: Two times a day (BID) | ORAL | Status: DC
  Administered 2015-12-14 – 2015-12-21 (×14): 40 mg via ORAL
  Filled 2015-12-14 (×14): qty 1

## 2015-12-14 MED ORDER — SODIUM CHLORIDE 0.9 % IV SOLN
INTRAVENOUS | Status: DC
  Administered 2015-12-14 – 2015-12-17 (×4): via INTRAVENOUS

## 2015-12-14 MED ORDER — LEVOTHYROXINE SODIUM 50 MCG PO TABS
50.0000 ug | ORAL_TABLET | Freq: Every day | ORAL | Status: DC
  Administered 2015-12-15 – 2015-12-21 (×7): 50 ug via ORAL
  Filled 2015-12-14 (×7): qty 1

## 2015-12-14 MED ORDER — ENOXAPARIN SODIUM 40 MG/0.4ML ~~LOC~~ SOLN: 40.0000 mg | SUBCUTANEOUS | Status: DC

## 2015-12-14 MED ORDER — ACETAMINOPHEN 650 MG RE SUPP: 650.0000 mg | Freq: Four times a day (QID) | RECTAL | Status: DC | PRN

## 2015-12-14 MED ORDER — FLUORESCEIN SODIUM 1 MG OP STRP
ORAL_STRIP | OPHTHALMIC | Status: AC
  Administered 2015-12-14: 1 via OPHTHALMIC
  Filled 2015-12-14: qty 1

## 2015-12-14 MED ORDER — DOCUSATE SODIUM 100 MG PO CAPS
100.0000 mg | ORAL_CAPSULE | Freq: Two times a day (BID) | ORAL | Status: DC
  Administered 2015-12-14 – 2015-12-20 (×14): 100 mg via ORAL
  Filled 2015-12-14 (×14): qty 1

## 2015-12-14 MED ORDER — ENOXAPARIN SODIUM 30 MG/0.3ML ~~LOC~~ SOLN
30.0000 mg | SUBCUTANEOUS | Status: DC
Start: 2015-12-14 — End: 2015-12-21
  Administered 2015-12-14 – 2015-12-20 (×7): 30 mg via SUBCUTANEOUS
  Filled 2015-12-14 (×7): qty 0.3

## 2015-12-14 MED ORDER — OXYCODONE HCL 5 MG PO TABS
5.0000 mg | ORAL_TABLET | ORAL | Status: DC | PRN
  Administered 2015-12-14 – 2015-12-16 (×7): 5 mg via ORAL
  Filled 2015-12-14 (×7): qty 1

## 2015-12-14 MED ORDER — SODIUM CHLORIDE 0.9 % IJ SOLN
3.0000 mL | Freq: Two times a day (BID) | INTRAMUSCULAR | Status: DC
  Administered 2015-12-14 – 2015-12-21 (×10): 3 mL via INTRAVENOUS

## 2015-12-14 MED ORDER — PANTOPRAZOLE SODIUM 40 MG PO TBEC: 40.0000 mg | DELAYED_RELEASE_TABLET | Freq: Two times a day (BID) | ORAL | Status: DC

## 2015-12-14 MED ORDER — SODIUM CHLORIDE 0.9 % IJ SOLN: 3.0000 mL | INTRAMUSCULAR | Status: DC | PRN

## 2015-12-14 MED ORDER — PREDNISOLONE ACETATE 1 % OP SUSP
1.0000 [drp] | Freq: Four times a day (QID) | OPHTHALMIC | Status: DC
  Filled 2015-12-14: qty 1

## 2015-12-14 MED ORDER — FLUORESCEIN SODIUM 1 MG OP STRP
1.0000 | ORAL_STRIP | Freq: Once | OPHTHALMIC | Status: AC
  Administered 2015-12-14: 1 via OPHTHALMIC

## 2015-12-14 MED ORDER — OFLOXACIN 0.3 % OP SOLN
1.0000 [drp] | Freq: Four times a day (QID) | OPHTHALMIC | Status: DC
  Administered 2015-12-14 – 2015-12-21 (×26): 1 [drp] via OPHTHALMIC
  Filled 2015-12-14: qty 5

## 2015-12-14 MED ORDER — OFLOXACIN 0.3 % OP SOLN: 1.0000 [drp] | Freq: Four times a day (QID) | OPHTHALMIC | Status: DC

## 2015-12-14 MED ORDER — PRESCRIPTION MEDICATION: 1.0000 [drp] | Freq: Four times a day (QID) | Status: DC

## 2015-12-14 MED ORDER — DIFLUPREDNATE 0.05 % OP EMUL: 1.0000 [drp] | Freq: Four times a day (QID) | OPHTHALMIC | Status: DC

## 2015-12-14 NOTE — ED Notes (Signed)
Per EMS is from the independent living from [facility]. Pt states that she was using her walker when she became "tangled up" in her walker and had a fall. Pt denies LOC, c/o middle back pain 7/10.Marland Kitchen Pt is alert and oriented at this time. Denies any other concerns. Denies dizziness or weakness at this time.

## 2015-12-14 NOTE — Progress Notes (Signed)
Hyponatremia: 128 - sodium deficit 211 Start NS 41ml/h

## 2015-12-14 NOTE — Care Management Obs Status (Signed)
[facility] NOTIFICATION   Patient Details  Name: [name] MRN: [id number] Date of Birth: 16-Sep-1914   Medicare Observation Status Notification Given:  Yes Medicare notice given to Son at bedside who is POA, and he has signed for the patient.    Beau Fanny, RN 12/14/2015, 1:01 PM

## 2015-12-14 NOTE — ED Notes (Addendum)
Dr. Sandra Cockayne at patient bedside.

## 2015-12-14 NOTE — ED Provider Notes (Signed)
[facility] [place] Emergency Department Provider Note  ____________________________________________  Time seen: On arrival  I have reviewed the triage vital signs and the nursing notes.   HISTORY  Chief Complaint Fall and Back Pain    HPI [name] is a 82 y.o. female who lives in independent living at [place] [facility]. She reports she lost her balanceand fell backwards onto her back. She denies palpitations, chest pain, shortness of breath, fevers. No abdominal pain nausea or vomiting. She complains only of mid back pain. No difficulty with range of motion of her extremities, no hip pain or pelvic pain.     Past Medical History  Diagnosis Date  . Eye infection   . Falls   . Pelvic fracture (HCC)     There are no active problems to display for this patient.   Past Surgical History  Procedure Laterality Date  . Abdominal hysterectomy    . Cesarean section      Current Outpatient Rx  Name  Route  Sig  Dispense  Refill  . aspirin 325 MG tablet   Oral   Take by mouth daily.         . Calcium Carbonate-Vitamin D 600-400 MG-UNIT per tablet   Oral   Take by mouth daily.         . Cholecalciferol (VITAMIN D3) 2000 UNITS capsule   Oral   Take by mouth daily.         . DUREZOL 0.05 % EMUL            0     Dispense as written.   Marland Kitchen levothyroxine (SYNTHROID, LEVOTHROID) 50 MCG tablet   Oral   Take 1 tablet (50 mcg total) by mouth daily.   30 tablet   0   . metoprolol tartrate (LOPRESSOR) 25 MG tablet   Oral   Take 2 tablets (50 mg total) by mouth daily.   30 tablet   0   . pantoprazole (PROTONIX) 40 MG tablet      TAKE 1 TABLET TWICE A DAY   180 tablet   1   . ramipril (ALTACE) 5 MG capsule   Oral   Take 1 capsule (5 mg total) by mouth daily.   30 capsule   0   . VIGAMOX 0.5 % ophthalmic solution            0     Dispense as written.   . vitamin C (ASCORBIC ACID) 500 MG tablet   Oral   Take by mouth daily.            Allergies Review of patient's allergies indicates no known allergies.  History reviewed. No pertinent family history.  Social History Social History  Substance Use Topics  . Smoking status: Never Smoker   . Smokeless tobacco: Never Used  . Alcohol Use: No    Review of Systems  Constitutional: Negative for fever. Eyes: Chronic left eye infection ENT: Negative for sore throat Cardiovascular: Negative for chest pain. Respiratory: Negative for shortness of breath. Gastrointestinal: Negative for abdominal pain, vomiting and diarrhea. Genitourinary: Negative for pelvic pain Musculoskeletal: Positive for back pain Skin: Positive abrasion left leg Neurological: Negative for headaches or focal weakness Psychiatric: No anxiety    ____________________________________________   PHYSICAL EXAM:  VITAL SIGNS: ED Triage Vitals  Enc Vitals Group     BP 12/14/15 0938 194/91 mmHg     Pulse Rate 12/14/15 0938 73     Resp 12/14/15 0938 18  Temp 12/14/15 0938 97.9 F (36.6 C)     Temp Source 12/14/15 0938 Oral     SpO2 12/14/15 0938 95 %     Weight 12/14/15 0938 108 lb (48.988 kg)     Height 12/14/15 0938 5\' 2"  (1.575 m)     Head Cir --      Peak Flow --      Pain Score 12/14/15 0939 7     Pain Loc --      Pain Edu? --      Excl. in [place]? --      Constitutional: Alert and oriented. Well appearing and in no distress. Well appearing for age. Pleasant and interactive Eyes: Left eye chronic infection noted, patient sees ophthalmology daily ENT   Head: Normocephalic and atraumatic.   Mouth/Throat: Mucous membranes are moist. Cardiovascular: Normal rate, regular rhythm. Normal and symmetric distal pulses are present in all extremities.  Respiratory: Normal respiratory effort without tachypnea nor retractions. Breath sounds are clear and equal bilaterally.  Gastrointestinal: Soft and non-tender in all quadrants. No distention. There is no CVA tenderness. Genitourinary:  deferred Musculoskeletal: Nontender with normal range of motion in all extremities. No lower extremity tenderness nor edema. No pain with axial load of the hips. No pelvic tenderness to palpation. Vertebral tenderness to palpation. Mild tenderness along the inferior border of the right scapula, full range of motion of the right shoulder Neurologic:  Normal speech and language. No gross focal neurologic deficits are appreciated. Skin:  Skin is warm, dry. Small abrasion to distal left lower leg anteriorly Psychiatric: Mood and affect are normal. Patient exhibits appropriate insight and judgment.  ____________________________________________    LABS (pertinent positives/negatives)  Labs Reviewed - No data to display  ____________________________________________   EKG  None  ____________________________________________    RADIOLOGY I have personally reviewed any xrays that were ordered on this patient: Right rib x-ray consistent with multiple rib fractures Chest, spine, scapula x-rays are unremarkable  ____________________________________________   PROCEDURES  Procedure(s) performed: none  Critical Care performed: none  ____________________________________________   INITIAL IMPRESSION / ASSESSMENT AND PLAN / ED COURSE  Pertinent labs & imaging results that were available during my care of the patient were reviewed by me and considered in my medical decision making (see chart for details).  Suspect contusion over fracture however we will send the patient for x-rays of the spine and right scapula and chest  X-rays consistent with multiple right-sided rib fractures.  We gave the patient a Vicodin which didn't improve her pain and attempted to ambulate with her but she was unable to even stand because of the pain and because she feels extremely weak. I will add on labs and I discussed with the internal medicine physicians for  admission  ____________________________________________   FINAL CLINICAL IMPRESSION(S) / ED DIAGNOSES  Final diagnoses:  Rib fractures, right, closed, initial encounter  Weakness     Lavonia Drafts, MD 12/14/15 1246

## 2015-12-14 NOTE — H&P (Signed)
[facility] NAME: [name]    MR#:  [id number]  DATE OF BIRTH:  04/24/1914  DATE OF ADMISSION:  12/14/2015  PRIMARY CARE PHYSICIAN: Wilhemena Durie, MD   REQUESTING/REFERRING PHYSICIAN: Dr Corky Downs  CHIEF COMPLAINT:   right-sided back and  after fall today  HISTORY OF PRESENT ILLNESS:  [name]  is a 82 y.o. female with a known history of chronic infection of the left eye, hypertension, GERD, hypothyroidism comes to the emergency room after she had a mechanical fall accident which independent living. Patient says she tried to get up using her walker however ended up getting tangled with her walker and had a mechanical fall. She started having significant amount of right sided rib pain and in the emergency room was found to have several right sided rib fractures. Patient was unable to ambulate and due to significant pain and is being admitted for further  management.  PAST MEDICAL HISTORY:   Past Medical History  Diagnosis Date  . Eye infection   . Falls   . Pelvic fracture ([place])     PAST SURGICAL HISTOIRY:   Past Surgical History  Procedure Laterality Date  . Abdominal hysterectomy    . Cesarean section      SOCIAL HISTORY:   Social History  Substance Use Topics  . Smoking status: Never Smoker   . Smokeless tobacco: Never Used  . Alcohol Use: No    FAMILY HISTORY:  History reviewed. No pertinent family history.  DRUG ALLERGIES:  No Known Allergies  REVIEW OF SYSTEMS:  Review of Systems  Constitutional: Negative for fever, chills and weight loss.  HENT: Negative for ear discharge, ear pain and nosebleeds.   Eyes: Negative for blurred vision, pain and discharge.  Respiratory: Negative for sputum production, shortness of breath, wheezing and stridor.   Cardiovascular: Positive for chest pain. Negative for palpitations, orthopnea and PND.  Gastrointestinal: Negative for nausea, vomiting,  abdominal pain and diarrhea.  Genitourinary: Negative for urgency and frequency.  Musculoskeletal: Positive for back pain and falls. Negative for joint pain.  Neurological: Negative for sensory change, speech change, focal weakness and weakness.  Psychiatric/Behavioral: Negative for depression and hallucinations. The patient is not nervous/anxious.   All other systems reviewed and are negative.    MEDICATIONS AT HOME:   Prior to Admission medications   Medication Sig Start Date End Date Taking? Authorizing Provider  aspirin 325 MG tablet Take by mouth daily.    Historical Provider, MD  Calcium Carbonate-Vitamin D 600-400 MG-UNIT per tablet Take by mouth daily.    Historical Provider, MD  Cholecalciferol (VITAMIN D3) 2000 UNITS capsule Take by mouth daily.    Historical Provider, MD  DUREZOL 0.05 % EMUL  03/30/15   Historical Provider, MD  levothyroxine (SYNTHROID, LEVOTHROID) 50 MCG tablet Take 1 tablet (50 mcg total) by mouth daily. 06/17/15   Margarita Rana, MD  metoprolol tartrate (LOPRESSOR) 25 MG tablet Take 2 tablets (50 mg total) by mouth daily. 06/17/15   Margarita Rana, MD  pantoprazole (PROTONIX) 40 MG tablet TAKE 1 TABLET TWICE A DAY 11/09/15   Richard Maceo Pro., MD  ramipril (ALTACE) 5 MG capsule Take 1 capsule (5 mg total) by mouth daily. 06/17/15   Margarita Rana, MD  VIGAMOX 0.5 % ophthalmic solution  03/23/15   Historical Provider, MD  vitamin C (ASCORBIC ACID) 500 MG tablet Take by mouth daily.    Historical Provider, MD  VITAL SIGNS:  Blood pressure 170/81, pulse 65, temperature 97.9 F (36.6 C), temperature source Oral, resp. rate 18, height 5\' 2"  (1.575 m), weight 48.988 kg (108 lb), SpO2 94 %.  PHYSICAL EXAMINATION:  GENERAL:  82 y.o.-year-old patient lying in the bed with no acute distress.  EYES: Pupils equal, round, reactive to light and accommodation. No scleral icterus. Extraocular muscles intact.  HEENT: Head atraumatic, normocephalic. Oropharynx and  nasopharynx clear.  NECK:  Supple, no jugular venous distention. No thyroid enlargement, no tenderness.  LUNGS: Normal breath sounds bilaterally, no wheezing, rales,rhonchi or crepitation. No use of accessory muscles of respiration.  CARDIOVASCULAR: S1, S2 normal. No murmurs, rubs, or gallops.  ABDOMEN: Soft, nontender, nondistended. Bowel sounds present. No organomegaly or mass.  EXTREMITIES: No pedal edema, cyanosis, or clubbing.  NEUROLOGIC: Cranial nerves II through XII are intact. Muscle strength 5/5 in all extremities. Sensation intact. Gait not checked.  PSYCHIATRIC: The patient is alert and oriented x 3.  SKIN: No obvious rash, lesion, or ulcer.   LABORATORY PANEL:   CBC No results for input(s): WBC, HGB, HCT, PLT in the last 168 hours. ------------------------------------------------------------------------------------------------------------------  Chemistries  No results for input(s): NA, K, CL, CO2, GLUCOSE, BUN, CREATININE, CALCIUM, MG, AST, ALT, ALKPHOS, BILITOT in the last 168 hours.  Invalid input(s): GFRCGP ------------------------------------------------------------------------------------------------------------------  Cardiac Enzymes No results for input(s): TROPONINI in the last 168 hours. ------------------------------------------------------------------------------------------------------------------  RADIOLOGY:  Dg Chest 2 View  12/14/2015  ADDENDUM REPORT: 12/14/2015 10:29 ADDENDUM: Review of the frontal chest radiograph shows at least one mildly displaced lateral rib fracture on the right, likely the seventh rib. There may be some adjacent rib fractures as well. A rib series may be helpful for further evaluation. Electronically Signed   By: Aletta Edouard M.D.   On: 12/14/2015 10:29  12/14/2015  CLINICAL DATA:  Fall with mid back and right scapular pain. Initial encounter. EXAM: CHEST - 2 VIEW COMPARISON:  08/06/2012 FINDINGS: Stable top-normal heart size. There  is no evidence of pulmonary edema, consolidation, pneumothorax, nodule or pleural fluid. There may be a component of hiatal hernia. Visualized bony structures show osteopenia and mild thoracic spondylosis. No overt acute fractures are identified. The thoracic aorta is calcified and mildly tortuous. IMPRESSION: No acute findings. Stable top-normal heart size. Possible hiatal hernia. Electronically Signed: By: Aletta Edouard M.D. On: 12/14/2015 10:19   Dg Ribs Unilateral W/chest Right  12/14/2015  CLINICAL DATA:  82 year old female with history of fall with right-sided rib pain. EXAM: RIGHT RIBS AND CHEST - 3+ VIEW COMPARISON:  Chest x-ray 12/14/2015. FINDINGS: Lung volumes are normal. No consolidative airspace disease. No pleural effusions. No evidence of pulmonary edema. No consolidate extraction heart size is normal. The patient is rotated to the left on today's exam, resulting in distortion of the mediastinal contours and reduced diagnostic sensitivity and specificity for mediastinal pathology. Atherosclerosis in the thoracic aorta. Multiple views of the right ribs demonstrate acute fractures of the right seventh, eighth and ninth ribs. The right seventh and ninth rib fractures are nondisplaced, while there is mild lateral displacement of the right eighth rib fracture. IMPRESSION: 1. Acute fractures of the lateral right seventh, eighth and ninth ribs. No associated pneumothorax. Electronically Signed   By: Vinnie Langton M.D.   On: 12/14/2015 11:39   Dg Thoracic Spine 2 View  12/14/2015  CLINICAL DATA:  Mid back pain secondary to a fall today. Right scapula pain. EXAM: THORACIC SPINE 2 VIEWS COMPARISON:  Chest x-ray dated 08/06/2012 FINDINGS: There is  chronic accentuation of the thoracic kyphosis with diffuse osteopenia. There is no discrete fracture although the T3 vertebra is not well defined on the lateral images. There is multilevel degenerative disc disease. Extensive calcification in the thoracic  aorta. IMPRESSION: No visible thoracic spine fracture. T3 is not well visualized, however. Diffuse osteopenia. Electronically Signed   By: Lorriane Shire M.D.   On: 12/14/2015 10:23   Dg Lumbar Spine 2-3 Views  12/14/2015  CLINICAL DATA:  Fall with back pain.  Initial encounter. EXAM: LUMBAR SPINE - 2-3 VIEW COMPARISON:  Lumbar spine CT on 09/25/2014 FINDINGS: No acute lumbar spine fracture is identified by radiography. There is stable mild compression of the L3 vertebral body. Stable roughly 7 mm anterolisthesis of L4 on L5 with associated degenerative disc disease at L4-5 with vacuum disc. Moderate degenerative disc disease is present at L5-S1 with vacuum disc. No bony lesions identified. Stable calcified abdominal aorta. IMPRESSION: No acute lumbar spine fractures identified. Electronically Signed   By: Aletta Edouard M.D.   On: 12/14/2015 10:23   Dg Scapula Right  12/14/2015  CLINICAL DATA:  Fall EXAM: RIGHT SCAPULA - 2+ VIEWS COMPARISON:  None FINDINGS: No acute scapula fracture. No dislocation. Osteopenia. There is a displaced fracture of a right lateral lower rib. IMPRESSION: Acute displaced right lower lateral rib fracture No evidence of scapular fracture. Electronically Signed   By: Marybelle Killings M.D.   On: 12/14/2015 10:25    IMPRESSION AND PLAN:  [name]  is a 82 y.o. female with a known history of chronic infection of the left eye, hypertension, GERD, hypothyroidism comes to the emergency room after she had a mechanical fall accident which independent living.  1. Acute right seventh and eighth and ninth rib fracture status post mechanical fall today. -Admit to medical floor -When necessary Tylenol, oxycodone, IV morphine -Physical therapy to see patient -Incentive spirometer -Social worker for discharge planning  2. Accelerated hypertension Resume metoprolol  3. GERD Continue PPI  4. Chronic left eye staph infection We'll continue her L Levaquin and vancomycin eyedrops as  directed by her ophthalmologist  5. History of glaucoma continue home eyedrops  Above was discussed with patient and patient's son and daughter.   All the records are reviewed and case discussed with ED provider. Management plans discussed with the patient, family and they are in agreement.  CODE STATUS: DO NOT RESUSCITATE  TOTAL TIME TAKING CARE OF THIS PATIENT: 50 minutes.    Rickey Sadowski M.D on 12/14/2015 at 1:04 PM  Between 7am to 6pm - Pager - [phone]  After 6pm go to [email or web address] - password [email or web address]  Office  [phone]  CC: Primary care physician; Wilhemena Durie, MD

## 2015-12-14 NOTE — ED Notes (Signed)
Patient transported to X-ray 

## 2015-12-14 NOTE — Plan of Care (Signed)
Problem: Education: Goal: Knowledge of [facility] Education information/materials will improve Outcome: Progressing Plan of care reviewed with pt.  Problem: Safety: Goal: Ability to remain free from injury will improve Outcome: Progressing Pt remains free of injury during the shift.  Problem: Pain Managment: Goal: General experience of comfort will improve Outcome: Progressing Oxycodone given for R rib cage pain once with improvement. Encouraged to deep breath and to use the spirometer.  Problem: Skin Integrity: Goal: Risk for impaired skin integrity will decrease Outcome: Progressing Sacral area reddened, but blanchable. Foam dressing applied. Pt repositioned qx2hrs. Heels elevated. Skin tear on L shin cleansed with NS and Mepitel dressing applied.

## 2015-12-14 NOTE — ED Notes (Signed)
Report given to Angela, RN.

## 2015-12-14 NOTE — ED Notes (Signed)
Attempted to ambulate patient per MD request, patient unable to ambulate with assistance from RN and tech. Dr. Corky Downs informed.

## 2015-12-14 NOTE — Progress Notes (Signed)
Anticoagulation monitoring(Lovenox):  82 yo  ordered Lovenox 40 mg Q24h  Filed Weights   12/14/15 0938  Weight: 108 lb (48.988 kg)   BMI 19.8  Lab Results  Component Value Date   CREATININE 0.53 12/14/2015   CREATININE 0.63 09/29/2014   CREATININE 0.62 09/25/2014   Estimated Creatinine Clearance: 29.6 mL/min (by C-G formula based on Cr of 0.53). Hemoglobin & Hematocrit     Component Value Date/Time   HGB 12.9 12/14/2015 1308   HGB 14.2 09/25/2014 1304   HCT 38.3 12/14/2015 1308   HCT 44.0 09/25/2014 1304   Hgb 12.9, Plt 263   Per Protocol for Patient with estCrcl< 30 ml/min and BMI < 40, will transition to Lovenox 30 mg Q24h.

## 2015-12-15 DIAGNOSIS — S2241XA Multiple fractures of ribs, right side, initial encounter for closed fracture: Secondary | ICD-10-CM | POA: Diagnosis not present

## 2015-12-15 DIAGNOSIS — E871 Hypo-osmolality and hyponatremia: Secondary | ICD-10-CM | POA: Diagnosis not present

## 2015-12-15 DIAGNOSIS — H44002 Unspecified purulent endophthalmitis, left eye: Secondary | ICD-10-CM | POA: Diagnosis not present

## 2015-12-15 DIAGNOSIS — I10 Essential (primary) hypertension: Secondary | ICD-10-CM | POA: Diagnosis not present

## 2015-12-15 DIAGNOSIS — K219 Gastro-esophageal reflux disease without esophagitis: Secondary | ICD-10-CM | POA: Diagnosis not present

## 2015-12-15 MED ORDER — CETYLPYRIDINIUM CHLORIDE 0.05 % MT LIQD
7.0000 mL | Freq: Two times a day (BID) | OROMUCOSAL | Status: DC
  Administered 2015-12-16 – 2015-12-20 (×9): 7 mL via OROMUCOSAL

## 2015-12-15 MED ORDER — HYDRALAZINE HCL 20 MG/ML IJ SOLN
10.0000 mg | INTRAMUSCULAR | Status: DC | PRN
  Administered 2015-12-15 – 2015-12-20 (×3): 10 mg via INTRAVENOUS
  Filled 2015-12-15 (×3): qty 1

## 2015-12-15 MED ORDER — ENSURE ENLIVE PO LIQD
237.0000 mL | Freq: Three times a day (TID) | ORAL | Status: DC
  Administered 2015-12-15 – 2015-12-21 (×8): 237 mL via ORAL

## 2015-12-15 NOTE — Care Management (Signed)
Admitted to this facility with the diagnosis of rib fracture. Lives alone at [place] x 3 years. Son is [name] [phone] or [phone]). Daughter is [name] [phone]). Last seen Dr. Rosanna Randy a year ago. Nursing assistant comes to [name]'s apartment 7 days a week to administer eye drops. Gotten physical therapy and nursing at [facility] in the past per [facility]. These services are on site. No skilled family. No home oxygen. No falls in the past other than the mechanical fall she presented to the hospital with. Good appetite. Life Alert in the home. Also, emergency call system. Takes care of basic activities of daily living herself. Family helps with errands. Family will transport. Shelbie Ammons RN MSN CCM Care Management [phone]

## 2015-12-15 NOTE — Progress Notes (Signed)
[facility] [place] NAME: [name]    MR#:  [id number]  DATE OF BIRTH:  1914/05/28  SUBJECTIVE:  CHIEF COMPLAINT:   Chief Complaint  Patient presents with  . Fall  . Back Pain   -Admitted after a fall and right-sided rib pain. Noted to have right-sided seventh eighth and ninth rib fractures. -Also complains of significant headache.  REVIEW OF SYSTEMS:  Review of Systems  Constitutional: Negative for fever and chills.  HENT: Negative for ear discharge, ear pain and nosebleeds.   Eyes: Negative for blurred vision.  Respiratory: Negative for cough, shortness of breath and wheezing.   Cardiovascular: Negative for chest pain and palpitations.  Gastrointestinal: Negative for nausea, vomiting, abdominal pain, diarrhea and constipation.  Genitourinary: Negative for dysuria.  Musculoskeletal: Positive for myalgias and back pain.  Neurological: Negative for dizziness, seizures and headaches.    DRUG ALLERGIES:  No Known Allergies  VITALS:  Blood pressure 139/63, pulse 57, temperature 98.2 F (36.8 C), temperature source Oral, resp. rate 19, height 5\' 2"  (1.575 m), weight 47.174 kg (104 lb), SpO2 95 %.  PHYSICAL EXAMINATION:  Physical Exam  GENERAL:  82 y.o.-year-old patient lying in the bed with no acute distress.  EYES: Pupils equal, round, reactive to light and accommodation. No scleral icterus. Extraocular muscles intact.  HEENT: Head atraumatic, normocephalic. Oropharynx and nasopharynx clear.  NECK:  Supple, no jugular venous distention. No thyroid enlargement, no tenderness.  LUNGS: Normal breath sounds bilaterally, no wheezing, rales,rhonchi or crepitation. No use of accessory muscles of respiration. Decreased bibasilar breath sounds. CARDIOVASCULAR: S1, S2 normal. Norubs, or gallops. 3/6 systolic murmur present ABDOMEN: Soft, nontender, nondistended. Bowel sounds present. No organomegaly or mass.  EXTREMITIES: No  pedal edema, cyanosis, or clubbing.  NEUROLOGIC: Cranial nerves II through XII are intact. Muscle strength 5/5 in all extremities. Sensation intact. Gait not checked.  PSYCHIATRIC: The patient is alert and oriented x 3.  SKIN: No obvious rash, lesion, or ulcer.    LABORATORY PANEL:   CBC  Recent Labs Lab 12/14/15 1308  WBC 14.0*  HGB 12.9  HCT 38.3  PLT 263   ------------------------------------------------------------------------------------------------------------------  Chemistries   Recent Labs Lab 12/14/15 1308  NA 128*  K 4.0  CL 94*  CO2 26  GLUCOSE 106*  BUN 18  CREATININE 0.53  CALCIUM 8.8*  AST 26  ALT 16  ALKPHOS 63  BILITOT 0.8   ------------------------------------------------------------------------------------------------------------------  Cardiac Enzymes No results for input(s): TROPONINI in the last 168 hours. ------------------------------------------------------------------------------------------------------------------  RADIOLOGY:  Dg Chest 2 View  12/14/2015  ADDENDUM REPORT: 12/14/2015 10:29 ADDENDUM: Review of the frontal chest radiograph shows at least one mildly displaced lateral rib fracture on the right, likely the seventh rib. There may be some adjacent rib fractures as well. A rib series may be helpful for further evaluation. Electronically Signed   By: Aletta Edouard M.D.   On: 12/14/2015 10:29  12/14/2015  CLINICAL DATA:  Fall with mid back and right scapular pain. Initial encounter. EXAM: CHEST - 2 VIEW COMPARISON:  08/06/2012 FINDINGS: Stable top-normal heart size. There is no evidence of pulmonary edema, consolidation, pneumothorax, nodule or pleural fluid. There may be a component of hiatal hernia. Visualized bony structures show osteopenia and mild thoracic spondylosis. No overt acute fractures are identified. The thoracic aorta is calcified and mildly tortuous. IMPRESSION: No acute findings. Stable top-normal heart size. Possible  hiatal hernia. Electronically Signed: By: Aletta Edouard M.D. On: 12/14/2015 10:19  Dg Ribs Unilateral W/chest Right  12/14/2015  CLINICAL DATA:  82 year old female with history of fall with right-sided rib pain. EXAM: RIGHT RIBS AND CHEST - 3+ VIEW COMPARISON:  Chest x-ray 12/14/2015. FINDINGS: Lung volumes are normal. No consolidative airspace disease. No pleural effusions. No evidence of pulmonary edema. No consolidate extraction heart size is normal. The patient is rotated to the left on today's exam, resulting in distortion of the mediastinal contours and reduced diagnostic sensitivity and specificity for mediastinal pathology. Atherosclerosis in the thoracic aorta. Multiple views of the right ribs demonstrate acute fractures of the right seventh, eighth and ninth ribs. The right seventh and ninth rib fractures are nondisplaced, while there is mild lateral displacement of the right eighth rib fracture. IMPRESSION: 1. Acute fractures of the lateral right seventh, eighth and ninth ribs. No associated pneumothorax. Electronically Signed   By: Vinnie Langton M.D.   On: 12/14/2015 11:39   Dg Thoracic Spine 2 View  12/14/2015  CLINICAL DATA:  Mid back pain secondary to a fall today. Right scapula pain. EXAM: THORACIC SPINE 2 VIEWS COMPARISON:  Chest x-ray dated 08/06/2012 FINDINGS: There is chronic accentuation of the thoracic kyphosis with diffuse osteopenia. There is no discrete fracture although the T3 vertebra is not well defined on the lateral images. There is multilevel degenerative disc disease. Extensive calcification in the thoracic aorta. IMPRESSION: No visible thoracic spine fracture. T3 is not well visualized, however. Diffuse osteopenia. Electronically Signed   By: Lorriane Shire M.D.   On: 12/14/2015 10:23   Dg Lumbar Spine 2-3 Views  12/14/2015  CLINICAL DATA:  Fall with back pain.  Initial encounter. EXAM: LUMBAR SPINE - 2-3 VIEW COMPARISON:  Lumbar spine CT on 09/25/2014 FINDINGS: No acute  lumbar spine fracture is identified by radiography. There is stable mild compression of the L3 vertebral body. Stable roughly 7 mm anterolisthesis of L4 on L5 with associated degenerative disc disease at L4-5 with vacuum disc. Moderate degenerative disc disease is present at L5-S1 with vacuum disc. No bony lesions identified. Stable calcified abdominal aorta. IMPRESSION: No acute lumbar spine fractures identified. Electronically Signed   By: Aletta Edouard M.D.   On: 12/14/2015 10:23   Dg Scapula Right  12/14/2015  CLINICAL DATA:  Fall EXAM: RIGHT SCAPULA - 2+ VIEWS COMPARISON:  None FINDINGS: No acute scapula fracture. No dislocation. Osteopenia. There is a displaced fracture of a right lateral lower rib. IMPRESSION: Acute displaced right lower lateral rib fracture No evidence of scapular fracture. Electronically Signed   By: Marybelle Killings M.D.   On: 12/14/2015 10:25    EKG:   Orders placed or performed in visit on 09/13/14  . EKG 12-Lead    ASSESSMENT AND PLAN:   Ivery Jardon is a 82 y.o. female with a known history of chronic infection of the left eye, hypertension, GERD, hypothyroidism comes to the emergency room after she had a mechanical fall accident which independent living.  1. Acute right seventh and eighth and ninth rib fracture status post mechanical fall. -Continue pain medications as needed. -Physical therapy consult pending. Likely might need placement. -Incentive spirometer -Social worker for discharge planning  2. Accelerated hypertension Resume metoprolol, Altace  3. GERD Continue PPI  4. Chronic left eye staph infection We'll continue her antibiotic eyedrops as directed by her ophthalmologist  5. Hyponatremia-sided to dehydration. Continue IV fluids and recheck sodium in the morning.  6. DVT prophylaxis heparin on Lovenox   Pending physical therapy consult.  All the records are reviewed  and case discussed with Care Management/Social Workerr. Management plans  discussed with the patient, family and they are in agreement.  CODE STATUS: DO NOT RESUSCITATE  TOTAL TIME TAKING CARE OF THIS PATIENT: 37 minutes.   POSSIBLE D/C IN 1-2 DAYS, DEPENDING ON CLINICAL CONDITION.   Gladstone Lighter M.D on 12/15/2015 at 4:26 PM  Between 7am to 6pm - Pager - [phone]  After 6pm go to [email or web address] - password [email or web address] [facility]  Office  [phone]  CC: Primary care physician; Wilhemena Durie, MD

## 2015-12-15 NOTE — Clinical Social Work Note (Signed)
Clinical Social Worker was consulted for discharge planning. PT is recommending HHPT. RNCM is following for discharge planning needs. CSW is signing off as no further needs identified.   Darden Dates, MSW, LCSW  Clinical Social Worker  [phone]

## 2015-12-15 NOTE — Progress Notes (Signed)
Initial Nutrition Assessment   INTERVENTION:   Meals and Snacks: Cater to patient preferences Medical Food Supplement Therapy: will recommend Ensure Enlive po TID, each supplement provides 350 kcal and 20 grams of protein    NUTRITION DIAGNOSIS:   Increased nutrient needs related to wound healing as evidenced by estimated needs.  GOAL:   Patient will meet greater than or equal to 90% of their needs  MONITOR:    (Energy Intake, Anthropometrics, Digestive System)  REASON FOR ASSESSMENT:   Malnutrition Screening Tool    ASSESSMENT:   Pt admitted with multiple rib fractures secondary to mechanical fall PTA.  Past Medical History  Diagnosis Date  . Eye infection   . Falls   . Pelvic fracture (HCC)      Diet Order:  Diet 2 gram sodium Room service appropriate?: Yes; Fluid consistency:: Thin    Current Nutrition: Pt reports eating some breakfast this am, most of her eggs. Pt reports appetite is ok currently and no difficulty tolerating eating.   Food/Nutrition-Related History: Pt reports good appetite PTA however eats small amounts. Pt reports liking Ensure and drinking at times PTA but not consistently.   Scheduled Medications:  . docusate sodium  100 mg Oral BID  . enoxaparin (LOVENOX) injection  30 mg Subcutaneous Q24H  . feeding supplement (ENSURE ENLIVE)  237 mL Oral TID WC  . levobunolol  1 drop Right Eye BID  . levothyroxine  50 mcg Oral QAC breakfast  . metoprolol tartrate  50 mg Oral Daily  . ofloxacin  1 drop Left Eye QID  . pantoprazole  40 mg Oral BID  . prednisoLONE acetate  1 drop Left Eye BID  . ramipril  5 mg Oral Daily  . sodium chloride  3 mL Intravenous Q12H  . vancomycin 50 mg/mL ophthalmic solution  1 drop Left Eye QID    Continuous Medications:  . sodium chloride 50 mL/hr at 12/15/15 0705     Electrolyte/Renal Profile and Glucose Profile:   Recent Labs Lab 12/14/15 1308  NA 128*  K 4.0  CL 94*  CO2 26  BUN 18  CREATININE 0.53   CALCIUM 8.8*  GLUCOSE 106*   Protein Profile:  Recent Labs Lab 12/14/15 1308  ALBUMIN 3.6    Gastrointestinal Profile: Last BM:  12/13/2015   Nutrition-Focused Physical Exam Findings:  Unable to complete Nutrition-Focused physical exam at this time.    Weight Change: Per [facility] weight loss noted 13% over the past 2 years   Skin:  Reviewed, no issues   Height:   Ht Readings from Last 1 Encounters:  12/14/15 5\' 2"  (1.575 m)    Weight:   Wt Readings from Last 1 Encounters:  12/14/15 104 lb (47.174 kg)    Wt Readings from Last 10 Encounters:  12/14/15 104 lb (47.174 kg)  07/17/14 120 lb (54.432 kg)    BMI:  Body mass index is 19.02 kg/(m^2).  Estimated Nutritional Needs:   Kcal:  BEE: 798kcals, TEE: (IF 1.2-1.4)(AF 1.2) 1150-1341kcals  Protein:  52-62g protein (1.1-1.3g/kg)  Fluid:  1175-1423mL of fluid (25-30mL/kg)  EDUCATION NEEDS:   No education needs identified at this time   [place], RD, LDN Pager [phone] Weekend/On-Call Pager [phone]

## 2015-12-15 NOTE — Plan of Care (Signed)
Problem: Safety: Goal: Ability to remain free from injury will improve Outcome: Progressing Pt high fall risk. Bed alarm in place. Pt instructed on how to call for assistance.   Problem: Pain Managment: Goal: General experience of comfort will improve Outcome: Progressing Oxycodone for rib pain and Tylenol for headache with improvement.  Problem: Skin Integrity: Goal: Risk for impaired skin integrity will decrease Outcome: Progressing Foam dressing in place for protection. Pt encouraged to turn and reposition.

## 2015-12-15 NOTE — Evaluation (Signed)
Physical Therapy Evaluation Patient Details Name: [name] MRN: [id number] DOB: 1914-04-26 Today's Date: 12/15/2015   History of Present Illness  [name] is a 82 y.o. female with a known history of chronic infection of the left eye, hypertension, GERD, hypothyroidism comes to the emergency room after she had a mechanical fall accident which independent living. Patient says she tried to get up using her walker however ended up getting tangled with her walker and had a mechanical fall. She started having significant amount of right sided rib pain and in the emergency room was found to have several right sided rib fractures.  Clinical Impression  Patient presents with right sided rib pain that gets worse with mobility and activity. She has 4/5 strength BLE and decreased standing dynamic balance, decreasd functional reach seated and standing, vision impairement due to left eye infection, decreased bed mobility, pain, decreased ambulation that causes patient to have poor mobility. She needs mod assist for bed mobility and transfers and is unable to perform ambulation with RW due to increased pain. She will benefit from skilled PT to improve mobility and balance and return to prior level of function.    Follow Up Recommendations Home health PT    Equipment Recommendations       Recommendations for Other Services       Precautions / Restrictions Precautions Precautions: Fall Restrictions Weight Bearing Restrictions: No      Mobility  Bed Mobility Overal bed mobility: Needs Assistance Bed Mobility: Supine to Sit     Supine to sit: Mod assist        Transfers Overall transfer level: Needs assistance Equipment used: Rolling walker (2 wheeled)                Ambulation/Gait Ambulation/Gait assistance:  (unable to ambulate due to pain level)              Stairs            Wheelchair Mobility    Modified Rankin (Stroke Patients Only)       Balance  Overall balance assessment: Modified Independent                                           Pertinent Vitals/Pain Pain Assessment: 0-10 Pain Score: 5  Pain Location: ribs right side Pain Descriptors / Indicators: Stabbing Pain Intervention(s): Limited activity within patient's tolerance    Home Living Family/patient expects to be discharged to:: Assisted living               Home Equipment: Walker - 2 wheels      Prior Function Level of Independence: Independent with assistive device(s)               Hand Dominance        Extremity/Trunk Assessment                         Communication   Communication: No difficulties  Cognition Arousal/Alertness: Awake/alert                          General Comments      Exercises General Exercises - Lower Extremity Ankle Circles/Pumps: AROM Quad Sets: AROM Gluteal Sets: AROM Short Arc Quad: AROM Heel Slides: AROM Straight Leg Raises: AROM      Assessment/Plan  PT Assessment Patient needs continued PT services  PT Diagnosis Difficulty walking   PT Problem List Decreased strength;Decreased activity tolerance;Decreased mobility;Decreased balance;Pain  PT Treatment Interventions Gait training;Therapeutic exercise;Balance training   PT Goals (Current goals can be found in the Care Plan section) Acute Rehab PT Goals Patient Stated Goal: to walk better PT Goal Formulation: With patient Potential to Achieve Goals: Good    Frequency Min 2X/week   Barriers to discharge        Co-evaluation               End of Session   Activity Tolerance: Patient limited by pain Patient left: in bed      Functional Assessment Tool Used: functional reach Functional Limitation: Mobility: Walking and moving around Mobility: Walking and Moving Around Current Status [id number]): At least 80 percent but less than 100 percent impaired, limited or restricted Mobility: Walking and Moving  Around Goal Status [phone]): At least 20 percent but less than 40 percent impaired, limited or restricted    Time: 0105-0145 PT Time Calculation (min) (ACUTE ONLY): 40 min   Charges:   PT Evaluation $PT Eval Moderate Complexity: 1 Procedure PT Treatments $Therapeutic Exercise: 8-22 mins   PT G Codes:   PT G-Codes **NOT FOR INPATIENT CLASS** Functional Assessment Tool Used: functional reach Functional Limitation: Mobility: Walking and moving around Mobility: Walking and Moving Around Current Status [id number]): At least 80 percent but less than 100 percent impaired, limited or restricted Mobility: Walking and Moving Around Goal Status [phone]): At least 20 percent but less than 40 percent impaired, limited or restricted    Arelia Sneddon S 12/15/2015, 1:58 PM

## 2015-12-16 ENCOUNTER — Observation Stay: Payer: Medicare Other

## 2015-12-16 DIAGNOSIS — B958 Unspecified staphylococcus as the cause of diseases classified elsewhere: Secondary | ICD-10-CM | POA: Diagnosis present

## 2015-12-16 DIAGNOSIS — K219 Gastro-esophageal reflux disease without esophagitis: Secondary | ICD-10-CM | POA: Diagnosis present

## 2015-12-16 DIAGNOSIS — W1830XA Fall on same level, unspecified, initial encounter: Secondary | ICD-10-CM | POA: Diagnosis present

## 2015-12-16 DIAGNOSIS — E039 Hypothyroidism, unspecified: Secondary | ICD-10-CM | POA: Diagnosis present

## 2015-12-16 DIAGNOSIS — H16072 Perforated corneal ulcer, left eye: Secondary | ICD-10-CM | POA: Diagnosis not present

## 2015-12-16 DIAGNOSIS — R531 Weakness: Secondary | ICD-10-CM | POA: Diagnosis not present

## 2015-12-16 DIAGNOSIS — E871 Hypo-osmolality and hyponatremia: Secondary | ICD-10-CM | POA: Diagnosis not present

## 2015-12-16 DIAGNOSIS — I10 Essential (primary) hypertension: Secondary | ICD-10-CM | POA: Diagnosis present

## 2015-12-16 DIAGNOSIS — Z66 Do not resuscitate: Secondary | ICD-10-CM | POA: Diagnosis present

## 2015-12-16 DIAGNOSIS — S2241XA Multiple fractures of ribs, right side, initial encounter for closed fracture: Secondary | ICD-10-CM | POA: Diagnosis present

## 2015-12-16 DIAGNOSIS — I959 Hypotension, unspecified: Secondary | ICD-10-CM | POA: Diagnosis present

## 2015-12-16 DIAGNOSIS — H16032 Corneal ulcer with hypopyon, left eye: Secondary | ICD-10-CM | POA: Diagnosis not present

## 2015-12-16 DIAGNOSIS — H409 Unspecified glaucoma: Secondary | ICD-10-CM | POA: Diagnosis present

## 2015-12-16 DIAGNOSIS — Z79899 Other long term (current) drug therapy: Secondary | ICD-10-CM | POA: Diagnosis not present

## 2015-12-16 DIAGNOSIS — E860 Dehydration: Secondary | ICD-10-CM | POA: Diagnosis present

## 2015-12-16 DIAGNOSIS — R51 Headache: Secondary | ICD-10-CM | POA: Diagnosis not present

## 2015-12-16 DIAGNOSIS — Z90710 Acquired absence of both cervix and uterus: Secondary | ICD-10-CM | POA: Diagnosis not present

## 2015-12-16 DIAGNOSIS — Z7982 Long term (current) use of aspirin: Secondary | ICD-10-CM | POA: Diagnosis not present

## 2015-12-16 DIAGNOSIS — S0990XA Unspecified injury of head, initial encounter: Secondary | ICD-10-CM | POA: Diagnosis not present

## 2015-12-16 DIAGNOSIS — S0532XA Ocular laceration without prolapse or loss of intraocular tissue, left eye, initial encounter: Secondary | ICD-10-CM | POA: Diagnosis present

## 2015-12-16 DIAGNOSIS — H44002 Unspecified purulent endophthalmitis, left eye: Secondary | ICD-10-CM | POA: Diagnosis not present

## 2015-12-16 LAB — BASIC METABOLIC PANEL
ANION GAP: 8 (ref 5–15)
BUN: 12 mg/dL (ref 6–20)
CO2: 24 mmol/L (ref 22–32)
Calcium: 8.3 mg/dL — ABNORMAL LOW (ref 8.9–10.3)
Chloride: 95 mmol/L — ABNORMAL LOW (ref 101–111)
Creatinine, Ser: 0.39 mg/dL — ABNORMAL LOW (ref 0.44–1.00)
GLUCOSE: 122 mg/dL — AB (ref 65–99)
POTASSIUM: 3.6 mmol/L (ref 3.5–5.1)
Sodium: 127 mmol/L — ABNORMAL LOW (ref 135–145)

## 2015-12-16 LAB — OSMOLALITY: OSMOLALITY: 262 mosm/kg — AB (ref 275–295)

## 2015-12-16 LAB — OSMOLALITY, URINE: Osmolality, Ur: 508 mOsm/kg (ref 300–900)

## 2015-12-16 MED ORDER — METAXALONE 800 MG PO TABS
400.0000 mg | ORAL_TABLET | Freq: Three times a day (TID) | ORAL | Status: DC
  Administered 2015-12-16 – 2015-12-21 (×14): 400 mg via ORAL
  Filled 2015-12-16: qty 0.5
  Filled 2015-12-16 (×3): qty 1
  Filled 2015-12-16: qty 0.5
  Filled 2015-12-16 (×7): qty 1
  Filled 2015-12-16: qty 0.5
  Filled 2015-12-16 (×3): qty 1
  Filled 2015-12-16: qty 0.5

## 2015-12-16 MED ORDER — TRAMADOL HCL 50 MG PO TABS
50.0000 mg | ORAL_TABLET | Freq: Four times a day (QID) | ORAL | Status: DC | PRN
  Administered 2015-12-16 – 2015-12-20 (×11): 50 mg via ORAL
  Filled 2015-12-16 (×12): qty 1

## 2015-12-16 MED ORDER — MAGNESIUM HYDROXIDE 400 MG/5ML PO SUSP: 30.0000 mL | Freq: Every day | ORAL | Status: DC | PRN

## 2015-12-16 MED ORDER — AMLODIPINE BESYLATE 5 MG PO TABS
5.0000 mg | ORAL_TABLET | Freq: Every day | ORAL | Status: DC
  Administered 2015-12-16 – 2015-12-19 (×4): 5 mg via ORAL
  Filled 2015-12-16 (×4): qty 1

## 2015-12-16 NOTE — Plan of Care (Signed)
Problem: Safety: Goal: Ability to remain free from injury will improve Outcome: Progressing Pt high fall risk with no impulsive behavior. Alert and oriented x 4. Calls for assistance. Uses bedpan to void. Complains of increasing discomfort when she attempts to get OOB. According to daughter pt did not do well with PT yesterday and leg exercises were performed in bed.   Problem: Pain Managment: Goal: General experience of comfort will improve Outcome: Progressing Oxycodone and Tylenol given more for headache instead of rib pain. B/p was elevated so MD notified. Hydralazine given 10 mg for systolic in the [id number] with improvement.   Problem: Skin Integrity: Goal: Risk for impaired skin integrity will decrease Outcome: Progressing Foam dressing to sacrum for protection. Heels elevated.

## 2015-12-16 NOTE — Progress Notes (Signed)
[facility] [place] NAME: [name]    MR#:  [id number]  DATE OF BIRTH:  10/23/1914  SUBJECTIVE:  CHIEF COMPLAINT:   Chief Complaint  Patient presents with  . Fall  . Back Pain   -Significant headache all night long. Blood pressure has been elevated. -Sodium remains low. Also still has the right-sided rib pain posteriorly secondary to fractures.Marland Kitchen  REVIEW OF SYSTEMS:  Review of Systems  Constitutional: Positive for malaise/fatigue. Negative for fever and chills.  HENT: Negative for ear discharge, ear pain and nosebleeds.   Eyes: Negative for blurred vision.  Respiratory: Negative for cough, shortness of breath and wheezing.   Cardiovascular: Negative for chest pain and palpitations.  Gastrointestinal: Negative for nausea, vomiting, abdominal pain, diarrhea and constipation.  Genitourinary: Negative for dysuria.  Musculoskeletal: Positive for myalgias and back pain.  Neurological: Positive for headaches. Negative for dizziness and seizures.    DRUG ALLERGIES:  No Known Allergies  VITALS:  Blood pressure 110/48, pulse 55, temperature 97.5 F (36.4 C), temperature source Oral, resp. rate 19, height 5\' 2"  (1.575 m), weight 47.174 kg (104 lb), SpO2 94 %.  PHYSICAL EXAMINATION:  Physical Exam  GENERAL:  82 y.o.-year-old patient lying in the bed with no acute distress.  EYES: Pupils equal, round, reactive to light and accommodation. No scleral icterus. Extraocular muscles intact.  HEENT: Head atraumatic, normocephalic. Oropharynx and nasopharynx clear.  NECK:  Supple, no jugular venous distention. No thyroid enlargement, no tenderness.  LUNGS: Normal breath sounds bilaterally, no wheezing, rales,rhonchi or crepitation. No use of accessory muscles of respiration. Decreased bibasilar breath sounds. CARDIOVASCULAR: S1, S2 normal. No rubs, or gallops. 3/6 systolic murmur present ABDOMEN: Soft, nontender, nondistended. Bowel  sounds present. No organomegaly or mass.  EXTREMITIES: No pedal edema, cyanosis, or clubbing.  NEUROLOGIC: Cranial nerves II through XII are intact. Muscle strength 5/5 in all extremities. Sensation intact. Gait not checked.  PSYCHIATRIC: The patient is alert and oriented x 3.  SKIN: No obvious rash, lesion, or ulcer.    LABORATORY PANEL:   CBC  Recent Labs Lab 12/14/15 1308  WBC 14.0*  HGB 12.9  HCT 38.3  PLT 263   ------------------------------------------------------------------------------------------------------------------  Chemistries   Recent Labs Lab 12/14/15 1308 12/16/15 0554  NA 128* 127*  K 4.0 3.6  CL 94* 95*  CO2 26 24  GLUCOSE 106* 122*  BUN 18 12  CREATININE 0.53 0.39*  CALCIUM 8.8* 8.3*  AST 26  --   ALT 16  --   ALKPHOS 63  --   BILITOT 0.8  --    ------------------------------------------------------------------------------------------------------------------  Cardiac Enzymes No results for input(s): TROPONINI in the last 168 hours. ------------------------------------------------------------------------------------------------------------------  RADIOLOGY:  Ct Head Wo Contrast  12/16/2015  CLINICAL DATA:  Headache since falling 4 weeks ago with head injury. Initial encounter. EXAM: CT HEAD WITHOUT CONTRAST TECHNIQUE: Contiguous axial images were obtained from the base of the skull through the vertex without intravenous contrast. COMPARISON:  None. FINDINGS: Skull and Sinuses:No posttraumatic finding. Complete opacification of right maxillary, anterior ethmoid, and frontal sinuses with osteitis. Probable bilateral middle turbinate concha bullosa with opacification. No destructive changes or sinus expansion where visualized. Visualized orbits: Bilateral cataract resection Brain: No evidence of acute infarction, hemorrhage, hydrocephalus, or mass lesion/mass effect. Patchy low-density in the bilateral cerebral white matter compatible with moderate  chronic small vessel disease. Normal cerebral volume for age. IMPRESSION: 1. No acute or posttraumatic finding. 2. Chronic small vessel disease. 3.  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 Electronically Signed   By: [name] M.D.   On: 12/16/2015 10:23    EKG:   Orders placed or performed in visit on 09/13/14  . EKG 12-Lead    ASSESSMENT AND PLAN:   [name] is a 82 y.o. female with a known history of chronic infection of the left eye, hypertension, GERD, hypothyroidism comes to the emergency room after she had a mechanical fall accident which independent living.  1. Acute right seventh and eighth and ninth rib fracture status post mechanical fall. -Continue pain medications as needed. -Physical therapy consulted. -Incentive spirometer -Social worker for discharge planning  2. Accelerated hypertension Resume metoprolol, Altace -Low-dose Norvasc added this morning. Blood pressure is much better HEADACHE: CT of the head ordered to rule out any intracranial bleed as there is no CT done on admission as the patient came in after a fall. -However CT of the head done today came back without any acute findings. At a muscle relaxant, you cervical pillow and monitor headaches. Also controlled her blood pressure.  3. GERD Continue PPI  4. Chronic left eye staph infection continue her antibiotic eyedrops as directed by her ophthalmologist  5.Hyponatremia-  Likely secondary to to dehydration. - Continue IV fluids again today, recheck sodium in the morning. -Check serum and urine osmolarity  6. DVT prophylaxis heparin on Lovenox   Pending physical therapy consult. Physical therapy yesterday recommended home health. To reevaluate again today  All the records are reviewed and case discussed with Care Management/Social Workerr. Management plans discussed with the patient, family and they are in agreement.  CODE STATUS: DO NOT RESUSCITATE  TOTAL TIME TAKING CARE  OF THIS PATIENT: 37 minutes.   POSSIBLE D/C TOMORROW, DEPENDING ON CLINICAL CONDITION.   Gladstone Lighter M.D on 12/16/2015 at 2:33 PM  Between 7am to 6pm - Pager - [phone]  After 6pm go to [email or web address] - password [email or web address] [facility]  Office  [phone]  CC: Primary care physician; Wilhemena Durie, MD

## 2015-12-16 NOTE — Progress Notes (Signed)
Physical Therapy Treatment Patient Details Name: [name] MRN: [id number] DOB: Feb 23, 1914 Today's Date: 12/16/2015    History of Present Illness [name] is a 82 y.o. female with a known history of chronic infection of the left eye, hypertension, GERD, hypothyroidism comes to the emergency room after she had a mechanical fall accident which independent living. Patient says she tried to get up using her walker however ended up getting tangled with her walker and had a mechanical fall. She started having significant amount of right sided rib pain and in the emergency room was found to have several right sided rib fractures.    PT Comments    Pt is extremely weak and painful during PT session today. She requires modA+1 for bed mobility and transfers due to weakness as well as RUE pain which limits use. Pt able to come to standing but once upright she is very unsteady on her feet requiring continual support from therapist to prevent posterior LOB. Pt unable to self correct in standing for balance. Attempted stepping with patient but she is only able to take one small step forward and unable to truly ambulate. Pt is very shaky reporting high levels of pain and instability. Unable to ambulate further at this time due to weakness, pain, and instability. Pt will require SNF placement at discharge in order to return to prior level of function. Pt will benefit from skilled PT services to address deficits in strength, balance, and mobility in order to return to full function at home.    Follow Up Recommendations  SNF     Equipment Recommendations  Other (comment) (TBD at facility. None recommended currently)    Recommendations for Other Services       Precautions / Restrictions Precautions Precautions: Fall Restrictions Weight Bearing Restrictions: No    Mobility  Bed Mobility Overal bed mobility: Needs Assistance Bed Mobility: Supine to Sit;Sit to Supine     Supine to sit: Mod  assist Sit to supine: Mod assist   General bed mobility comments: Pt with significant increase in R flank pain during bed mobility. Significant weakness with bed mobility. Pt requires cues to roll to L side and use bed rail to assist. HOB elevated to assist. Bed in trendelenberg position and maxA+1 to assist scooting toward [facility]  Transfers Overall transfer level: Needs assistance Equipment used: Rolling walker (2 wheeled) Transfers: Sit to/from Stand Sit to Stand: Mod assist         General transfer comment: Pt requires heavy cues for transfer. Gait belt utilized but pt supported under LUE due to R flank pain. Pt is very unsteady on feet with decreased LE strength/power noted. Posterior leaning requiring continual assist of minA to prevent falling backwards. Pt tremulous in standing and has difficulty bringing RUE from bed up to walker for support. Once in standing pt with decreased WB through RUE due to R flank pain. Attempted ambulation but pt only able to take one very labored step forward with modA+1. Severe instability and LE weakness noted with high levels of pain.  Ambulation/Gait             General Gait Details: Attempted however unable to perform at this time   Stairs            Wheelchair Mobility    Modified Rankin (Stroke Patients Only)       Balance Overall balance assessment: Needs assistance Sitting-balance support: Bilateral upper extremity supported Sitting balance-Leahy Scale: Fair Sitting balance - Comments: Pt with bouts  of fair balance but intermittently tips posteriorly in sitting requiring therapist support to safely remain upright. Pt provided cues for anterior weight shifting Postural control: Posterior lean Standing balance support: Bilateral upper extremity supported Standing balance-Leahy Scale: Poor Standing balance comment: Pt requires continual support to prevent posterior LOB in standing with bilateral UE support                     Cognition Arousal/Alertness: Awake/alert Behavior During Therapy: WFL for tasks assessed/performed Overall Cognitive Status: Within Functional Limits for tasks assessed                      Exercises General Exercises - Lower Extremity Ankle Circles/Pumps: Strengthening;Both;15 reps;Supine Quad Sets: Strengthening;Both;15 reps;Supine Gluteal Sets: Strengthening;Both;15 reps;Supine Long Arc Quad: Strengthening;Both;Seated;10 reps Heel Slides: Strengthening;Both;10 reps;Seated Hip ABduction/ADduction: Strengthening;Both;10 reps;Seated (Also performed x 15 in supine) Straight Leg Raises: Strengthening;Both;15 reps;Supine Hip Flexion/Marching: Strengthening;Both;10 reps;Seated Heel Raises: Strengthening;Both;10 reps;Seated    General Comments        Pertinent Vitals/Pain Pain Assessment: 0-10 Pain Score: 0-No pain (Increases to 8/10 with bed mobility/transfers) Pain Location: R flank/ribs Pain Intervention(s): Limited activity within patient's tolerance;Monitored during session;Patient requesting pain meds-RN notified    Home Living                      Prior Function            PT Goals (current goals can now be found in the care plan section) Acute Rehab PT Goals Patient Stated Goal: to walk better PT Goal Formulation: With patient/family Potential to Achieve Goals: Good Progress towards PT goals: Progressing toward goals    Frequency  7X/week    PT Plan Discharge plan needs to be updated    Co-evaluation             End of Session Equipment Utilized During Treatment: Gait belt Activity Tolerance: Patient limited by pain Patient left: in bed     Time: [id number] PT Time Calculation (min) (ACUTE ONLY): 25 min  Charges:  $Therapeutic Exercise: 8-22 mins $Therapeutic Activity: 8-22 mins                    G Codes:     [name] PT, DPT   [name] 12/16/2015, 3:28 PM

## 2015-12-16 NOTE — Plan of Care (Signed)
Problem: Education: Goal: Knowledge of [facility] Education information/materials will improve Outcome: Progressing Plan of care reviewed with pt and family.  Problem: Safety: Goal: Ability to remain free from injury will improve Outcome: Progressing Pt remains free of injury during the shift.  Problem: Pain Managment: Goal: General experience of comfort will improve Outcome: Progressing Pt c/o headache in am, tylenol given with improvement. Morphine and tramadol given for R Rib cage pain with good effect.  Skelaxin initiated today. Cervical pillow in use.   Zofran given for nausea once with improvement. Sodium level 127, IVF rate increased to 91ml/hr.     Problem: Activity: Goal: Risk for activity intolerance will decrease Outcome: Progressing Pt worked with PT today. Movement very limited. Refuses repositioning qx2hrs. Repositioning done per request. PT recommending SNF.

## 2015-12-16 NOTE — Clinical Social Work Placement (Signed)
   CLINICAL SOCIAL WORK PLACEMENT  NOTE  Date:  12/16/2015  Patient Details  Name: [name] MRN: [id number] Date of Birth: Aug 09, 1914  Clinical Social Work is seeking post-discharge placement for this patient at the [place] level of care (*CSW will initial, date and re-position this form in  chart as items are completed):  Yes   Patient/family provided with [facility] Work Department's list of facilities offering this level of care within the geographic area requested by the patient (or if unable, by the patient's family).  Yes   Patient/family informed of their freedom to choose among providers that offer the needed level of care, that participate in Medicare, Medicaid or managed care program needed by the patient, have an available bed and are willing to accept the patient.  Yes   Patient/family informed of [facility]'s ownership interest in [facility] and [facility], as well as of the fact that they are under no obligation to receive care at these facilities.  PASRR submitted to EDS on       PASRR number received on       Existing PASRR number confirmed on 12/16/15     FL2 transmitted to all facilities in geographic area requested by pt/family on 12/16/15     FL2 transmitted to all facilities within larger geographic area on       Patient informed that his/her managed care company has contracts with or will negotiate with certain facilities, including the following:            Patient/family informed of bed offers received.  Patient chooses bed at       Physician recommends and patient chooses bed at      Patient to be transferred to   on  .  Patient to be transferred to facility by       Patient family notified on   of transfer.  Name of family member notified:        PHYSICIAN       Additional Comment:    _______________________________________________ Loralyn Freshwater, LCSW 12/16/2015, 3:42 PM

## 2015-12-16 NOTE — Clinical Social Work Note (Signed)
Clinical Social Work Assessment  Patient Details  Name: [name] MRN: [id number] Date of Birth: 1914-07-25  Date of referral:  12/16/15               Reason for consult:  Facility Placement                Permission sought to share information with:  Chartered certified accountant granted to share information::  Yes, Verbal Permission Granted  Name::      [place]::   [place]   Relationship::     Contact Information:     Housing/Transportation Living arrangements for the past 2 months[facility] Source of Information:  Patient, [name], Adult Children Patient Interpreter Needed:  None Criminal Activity/Legal Involvement Pertinent to Current Situation/Hospitalization:  No - Comment as needed Significant Relationships:  Adult Children Lives with:  Self Do you feel safe going back to the place where you live?  Yes Need for family participation in patient care:  Yes (Comment)  Care giving concerns: Patient is a resident at [facility].    Social Worker assessment / plan: Per RN Case Manager patient will change from observation to inpatient today 12/16/15 due to her sodium level of 127. PT changed recommendation from home health to SNF today 12/16/15. Clinical Social Worker (CSW) met with patient and her son [name] and daughter [name] were at bedside. CSW introduced self and explained role of CSW department. Patient and family are familiar with CSW from admission in 2015 on Orthopedic unit at [facility]. Patient reported that she has her own apartment at [facility]. Per son [name] he is POA and lives in [place]. Per daughter [name] she is HPOA and lives out of town. Daughter reported that in 2015 patient had a pelvic fracture and was under Medicare observation and paid privately for SNF [facility]. CSW explained to family that per RN Case Manger patient has  changed to inpatient today due to sodium level. Family was grateful to hear change to inpatient status. CSW explained that PT is recommending SNF. Son and daughter prefer [facility] or [facility]. SNF list was provided. CSW explained that in order for patient's Medicare to pay for SNF she will have to meet a 3 night inpatient qualifying stay starting today. Family verbalized their understanding.   FL2 complete and faxed out.    Employment status:  Retired Forensic scientist:  Medicare PT Recommendations:  [place] / Referral to community resources:  [place]  Patient/Family's Response to care: Patient and family are agreeable to [facility] and prefer [facility] or [facility].   Patient/Family's Understanding of and Emotional Response to Diagnosis, Current Treatment, and Prognosis: Patient and family were pleasant throughout assessment.   Emotional Assessment Appearance:  Appears stated age Attitude/Demeanor/Rapport:    Affect (typically observed):  Accepting, Adaptable, Pleasant Orientation:  Oriented to Self, Oriented to Place, Oriented to  Time, Oriented to Situation Alcohol / Substance use:  Not Applicable Psych involvement (Current and /or in the community):  No (Comment)  Discharge Needs  Concerns to be addressed:  Discharge Planning Concerns Readmission within the last 30 days:  No Current discharge risk:  Dependent with Mobility Barriers to Discharge:  Continued Medical Work up   Elwyn Reach 12/16/2015, 3:44 PM

## 2015-12-16 NOTE — NC FL2 (Signed)
[place] LEVEL OF CARE SCREENING TOOL     IDENTIFICATION  Patient Name: [name] Birthdate: September 16, 1914 Sex: female Admission Date (Current Location): 12/14/2015  [place] and [place] Number:  Engineering geologist and Address:  [facility], [address], [place], [place] [address]      Provider Number: [id number]  Attending Physician Name and Address:  Gladstone Lighter, MD  Relative Name and Phone Number:       Current Level of Care: Hospital Recommended Level of Care: [place] Prior Approval Number:    Date Approved/Denied:   PASRR Number:  ([id number])  Discharge Plan: SNF    Current Diagnoses: Patient Active Problem List   Diagnosis Date Noted  . Hyponatremia 12/16/2015  . Rib fracture 12/14/2015    Orientation RESPIRATION BLADDER Height & Weight    Self, Time, Situation, Place  Normal Continent 5\' 2"  (157.5 cm) 104 lbs.  BEHAVIORAL SYMPTOMS/MOOD NEUROLOGICAL BOWEL NUTRITION STATUS   (none )  (none ) Continent Diet (Regular Diet )  AMBULATORY STATUS COMMUNICATION OF NEEDS Skin   Extensive Assist Verbally Normal                       Personal Care Assistance Level of Assistance  Bathing, Feeding, Dressing Bathing Assistance: Limited assistance Feeding assistance: Independent Dressing Assistance: Limited assistance     Functional Limitations Info  Sight, Hearing, Speech Sight Info: Adequate Hearing Info: Adequate Speech Info: Adequate    SPECIAL CARE FACTORS FREQUENCY  PT (By licensed PT)     PT Frequency:  (5)              Contractures      Additional Factors Info  Code Status Code Status Info:  (DNR )             Current Medications (12/16/2015):  This is the current hospital active medication list Current Facility-Administered Medications  Medication Dose Route Frequency Provider Last Rate Last Dose  . 0.9 %  sodium chloride infusion  250 mL Intravenous PRN Fritzi Mandes, MD      . 0.9 %  sodium chloride infusion   Intravenous Continuous Gladstone Lighter, MD 50 mL/hr at 12/16/15 1053    . acetaminophen (TYLENOL) tablet 650 mg  650 mg Oral Q6H PRN Fritzi Mandes, MD   650 mg at 12/16/15 [id number]   Or  . acetaminophen (TYLENOL) suppository 650 mg  650 mg Rectal Q6H PRN Fritzi Mandes, MD      . amLODipine (NORVASC) tablet 5 mg  5 mg Oral Daily Gladstone Lighter, MD   5 mg at 12/16/15 0925  . antiseptic oral rinse (CPC / CETYLPYRIDINIUM CHLORIDE 0.05%) solution 7 mL  7 mL Mouth Rinse q12n4p Gladstone Lighter, MD   7 mL at 12/16/15 1211  . bisacodyl (DULCOLAX) EC tablet 5 mg  5 mg Oral Daily PRN Fritzi Mandes, MD      . docusate sodium (COLACE) capsule 100 mg  100 mg Oral BID Fritzi Mandes, MD   100 mg at 12/16/15 0925  . enoxaparin (LOVENOX) injection 30 mg  30 mg Subcutaneous Q24H Fritzi Mandes, MD   30 mg at 12/15/15 2109  . feeding supplement (ENSURE ENLIVE) (ENSURE ENLIVE) liquid 237 mL  237 mL Oral TID WC Gladstone Lighter, MD   237 mL at 12/16/15 1212  . hydrALAZINE (APRESOLINE) injection 10 mg  10 mg Intravenous Q4H PRN Lytle Butte, MD   10 mg at 12/16/15 0551  .  levobunolol (BETAGAN) 0.5 % ophthalmic solution 1 drop  1 drop Right Eye BID Fritzi Mandes, MD   1 drop at 12/16/15 1016  . levothyroxine (SYNTHROID, LEVOTHROID) tablet 50 mcg  50 mcg Oral QAC breakfast Fritzi Mandes, MD   50 mcg at 12/16/15 0925  . magnesium hydroxide (MILK OF MAGNESIA) suspension 30 mL  30 mL Oral Daily PRN Gladstone Lighter, MD      . metaxalone [facility]) tablet 400 mg  400 mg Oral TID Gladstone Lighter, MD   400 mg at 12/16/15 1211  . metoprolol (LOPRESSOR) tablet 50 mg  50 mg Oral Daily Fritzi Mandes, MD   50 mg at 12/16/15 0925  . morphine 2 MG/ML injection 1 mg  1 mg Intravenous Q4H PRN Fritzi Mandes, MD   1 mg at 12/16/15 0936  . ofloxacin (OCUFLOX) 0.3 % ophthalmic solution 1 drop  1 drop Left Eye QID Fritzi Mandes, MD   1 drop at 12/16/15 1421  . ondansetron (ZOFRAN) tablet 4 mg  4 mg Oral Q6H PRN  Fritzi Mandes, MD       Or  . ondansetron (ZOFRAN) injection 4 mg  4 mg Intravenous Q6H PRN Fritzi Mandes, MD      . pantoprazole (PROTONIX) EC tablet 40 mg  40 mg Oral BID Fritzi Mandes, MD   40 mg at 12/16/15 0925  . prednisoLONE acetate (PRED FORTE) 1 % ophthalmic suspension 1 drop  1 drop Left Eye BID Fritzi Mandes, MD   1 drop at 12/16/15 0938  . ramipril (ALTACE) capsule 5 mg  5 mg Oral Daily Fritzi Mandes, MD   5 mg at 12/16/15 0925  . sodium chloride 0.9 % injection 3 mL  3 mL Intravenous Q12H Fritzi Mandes, MD   3 mL at 12/14/15 2053  . sodium chloride 0.9 % injection 3 mL  3 mL Intravenous PRN Fritzi Mandes, MD      . traMADol Veatrice Bourbon) tablet 50 mg  50 mg Oral Q6H PRN Gladstone Lighter, MD   50 mg at 12/16/15 1538  . vancomycin 50 mg/mL ophthalmic solution  1 drop Left Eye QID Fritzi Mandes, MD   1 drop at 12/16/15 1416     Discharge Medications: Please see discharge summary for a list of discharge medications.  Relevant Imaging Results:  Relevant Lab Results:   Additional Information  (SSN: [id number])  Loralyn Freshwater, LCSW

## 2015-12-17 LAB — BASIC METABOLIC PANEL
ANION GAP: 7 (ref 5–15)
BUN: 22 mg/dL — ABNORMAL HIGH (ref 6–20)
CO2: 24 mmol/L (ref 22–32)
Calcium: 8.1 mg/dL — ABNORMAL LOW (ref 8.9–10.3)
Chloride: 96 mmol/L — ABNORMAL LOW (ref 101–111)
Creatinine, Ser: 0.33 mg/dL — ABNORMAL LOW (ref 0.44–1.00)
GFR calc Af Amer: >60 mL/min (ref >60)
GLUCOSE: 116 mg/dL — AB (ref 65–99)
POTASSIUM: 4.1 mmol/L (ref 3.5–5.1)
Sodium: 127 mmol/L — ABNORMAL LOW (ref 135–145)

## 2015-12-17 LAB — CBC
HCT: 35.6 % (ref 35.0–47.0)
Hemoglobin: 12.1 g/dL (ref 12.0–16.0)
MCH: 30.1 pg (ref 26.0–34.0)
MCHC: 33.9 g/dL (ref 32.0–36.0)
MCV: 88.8 fL (ref 80.0–100.0)
PLATELETS: 244 10*3/uL (ref 150–440)
RBC: 4.01 MIL/uL (ref 3.80–5.20)
RDW: 13.4 % (ref 11.5–14.5)
WBC: 9.9 10*3/uL (ref 3.6–11.0)

## 2015-12-17 MED ORDER — POLYETHYLENE GLYCOL 3350 17 G PO PACK
17.0000 g | PACK | Freq: Every day | ORAL | Status: DC
  Administered 2015-12-17 – 2015-12-20 (×4): 17 g via ORAL
  Filled 2015-12-17 (×5): qty 1

## 2015-12-17 MED ORDER — PREDNISOLONE ACETATE 1 % OP SUSP
1.0000 [drp] | Freq: Four times a day (QID) | OPHTHALMIC | Status: DC
  Administered 2015-12-17 – 2015-12-21 (×16): 1 [drp] via OPHTHALMIC

## 2015-12-17 NOTE — Progress Notes (Signed)
Physical Therapy Treatment Patient Details Name: [name] MRN: [id number] DOB: 10/24/1914 Today's Date: 12/17/2015    History of Present Illness [name] is a 82 y.o. female with a known history of chronic infection of the left eye, hypertension, GERD, hypothyroidism comes to the emergency room after she had a mechanical fall accident which independent living. Patient says she tried to get up using her walker however ended up getting tangled with her walker and had a mechanical fall. She started having significant amount of right sided rib pain and in the emergency room was found to have several right sided rib fractures.    PT Comments    Pt is making gradual progress towards goals with ability to ambulate to recliner this date. Pt requires heavy cues for sequencing and manual facilitation for stepping as pt very limited by pain. Pt with fair standing balance, however does need hands on assist for maintaining upright posture. Good endurance with there-ex. Pt very motivated to perform therapy.  Follow Up Recommendations  SNF     Equipment Recommendations       Recommendations for Other Services       Precautions / Restrictions Precautions Precautions: Fall Restrictions Weight Bearing Restrictions: No    Mobility  Bed Mobility Overal bed mobility: Needs Assistance Bed Mobility: Supine to Sit     Supine to sit: Mod assist     General bed mobility comments: Pt able to initiate B LEs off of bed with +2 assist required for scooting hips towards EOB. Once seated at EOB, pt able to improve sitting balance to only requiring cga.   Transfers Overall transfer level: Needs assistance Equipment used: Rolling walker (2 wheeled) Transfers: Sit to/from Stand Sit to Stand: Mod assist         General transfer comment: assist for transfers using rw and tactile cues on sacrum and sternum. Pt able to stand upright with cues.   Ambulation/Gait Ambulation/Gait assistance: Min  assist Ambulation Distance (Feet): 3 Feet Assistive device: Rolling walker (2 wheeled) Gait Pattern/deviations: Step-to pattern     General Gait Details: +2 assistance and manual cues to perform step to gait pattern to recliner. Heavy cues for sequencing and manual facilitation at hips to promote weightshifting in order to take steps. Pt able to follow commands. Patient fatigues with limited exertion.   Stairs            Wheelchair Mobility    Modified Rankin (Stroke Patients Only)       Balance                                    Cognition Arousal/Alertness: Awake/alert Behavior During Therapy: WFL for tasks assessed/performed Overall Cognitive Status: Within Functional Limits for tasks assessed                      Exercises Other Exercises Other Exercises: Supine/seated ther-ex performed including B hip abd/add, SAQ, SLRs, and heel slides. All ther-ex performed x 12 reps with cues for correct technique and supervision.    General Comments        Pertinent Vitals/Pain Pain Assessment: 0-10 Pain Score: 3  Pain Location: R ribs/radiating to mid thoracic spine Pain Descriptors / Indicators: Aching Pain Intervention(s): Limited activity within patient's tolerance    Home Living  Prior Function            PT Goals (current goals can now be found in the care plan section) Acute Rehab PT Goals Patient Stated Goal: to walk better PT Goal Formulation: With patient/family Potential to Achieve Goals: Good Progress towards PT goals: Progressing toward goals    Frequency  7X/week    PT Plan Current plan remains appropriate    Co-evaluation             End of Session   Activity Tolerance: Patient limited by pain Patient left: in chair;with chair alarm set     Time: [id number] PT Time Calculation (min) (ACUTE ONLY): 23 min  Charges:  $Gait Training: 8-22 mins $Therapeutic Exercise: 8-22 mins                     G Codes:      Shaya Altamura 2016/01/04, 3:07 PM  Greggory Stallion, PT, DPT [phone]

## 2015-12-17 NOTE — Plan of Care (Signed)
Problem: Education: Goal: Knowledge of [facility] Education information/materials will improve Outcome: Progressing Pt is alert and oriented, c/o headache. Tylenol and Tramadol given with improvement. Daughter at bedside during the evening, supportive. Pt receiving NS at 75 ml/hr .  Problem: Safety: Goal: Ability to remain free from injury will improve Outcome: Progressing High Fall Risk, calls for assistance. Bed alarm on, hourly rounding performed.

## 2015-12-17 NOTE — Clinical Social Work Note (Signed)
CSW provided bed offers to pt and daughter. Pt and daughter chose [facility]. CSW confirmed bed with admissions coordinator. Facility will need a discharge summary with medications for a weekend admission. CSW will continue to follow.   Darden Dates, MSW, LCSW  Clinical Social Worker  [phone]

## 2015-12-17 NOTE — Progress Notes (Signed)
[place] [facility] [place] NAME: [name]    MR#:  [id number]  DATE OF BIRTH:  1914-05-31  SUBJECTIVE:  CHIEF COMPLAINT:   Chief Complaint  Patient presents with  . Fall  . Back Pain   -Admitted after a fall and right-sided rib pain. Noted to have right-sided seventh eighth and ninth rib fractures. -Also complains of significant headache, better this morning - Hyponatremic at 1272 days - Has not really gotten out of bed much  REVIEW OF SYSTEMS:  Review of Systems  Constitutional: Negative for fever and chills.  HENT: Negative for ear discharge, ear pain and nosebleeds.   Eyes: Negative for blurred vision.  Respiratory: Negative for cough, shortness of breath and wheezing.   Cardiovascular: Negative for chest pain and palpitations.  Gastrointestinal: Negative for nausea, vomiting, abdominal pain, diarrhea and constipation.  Genitourinary: Negative for dysuria.  Musculoskeletal: Positive for myalgias and back pain.  Neurological: Negative for dizziness, seizures and headaches.    DRUG ALLERGIES:  No Known Allergies  VITALS:  Blood pressure 177/75, pulse 62, temperature 97.8 F (36.6 C), temperature source Oral, resp. rate 18, height 5\' 2"  (1.575 m), weight 47.174 kg (104 lb), SpO2 95 %.  PHYSICAL EXAMINATION:  Physical Exam  GENERAL:  82 y.o.-year-old patient lying in the bed with no acute distress. Eating breakfast EYES: Pupils equal, round, reactive to light and accommodation. No scleral icterus. Extraocular muscles intact.  HEENT: Head atraumatic, normocephalic. Oropharynx and nasopharynx clear.  NECK:  Supple, no jugular venous distention. No thyroid enlargement, no tenderness.  LUNGS: Normal breath sounds bilaterally, no wheezing, rales,rhonchi or crepitation. No use of accessory muscles of respiration. Decreased bibasilar breath sounds. CARDIOVASCULAR: S1, S2 normal. Norubs, or gallops. 3/6 systolic murmur  present ABDOMEN: Soft, nontender, nondistended. Bowel sounds present. No organomegaly or mass.  EXTREMITIES: No pedal edema, cyanosis, or clubbing. Pulses 2+ NEUROLOGIC: Cranial nerves II through XII are intact. Muscle strength 5/5 in all extremities. Sensation intact. Gait not checked.  PSYCHIATRIC: The patient is alert and oriented x 3.  SKIN: No obvious rash, lesion, or ulcer.    LABORATORY PANEL:   CBC  Recent Labs Lab 12/17/15 0407  WBC 9.9  HGB 12.1  HCT 35.6  PLT 244   ------------------------------------------------------------------------------------------------------------------  Chemistries   Recent Labs Lab 12/14/15 1308  12/17/15 0407  NA 128*  < > 127*  K 4.0  < > 4.1  CL 94*  < > 96*  CO2 26  < > 24  GLUCOSE 106*  < > 116*  BUN 18  < > 22*  CREATININE 0.53  < > 0.33*  CALCIUM 8.8*  < > 8.1*  AST 26  --   --   ALT 16  --   --   ALKPHOS 63  --   --   BILITOT 0.8  --   --   < > = values in this interval not displayed. ------------------------------------------------------------------------------------------------------------------  Cardiac Enzymes No results for input(s): TROPONINI in the last 168 hours. ------------------------------------------------------------------------------------------------------------------  RADIOLOGY:  Ct Head Wo Contrast  12/16/2015  CLINICAL DATA:  Headache since falling 4 weeks ago with head injury. Initial encounter. EXAM: CT HEAD WITHOUT CONTRAST TECHNIQUE: Contiguous axial images were obtained from the base of the skull through the vertex without intravenous contrast. COMPARISON:  None. FINDINGS: Skull and Sinuses:No posttraumatic finding. Complete opacification of right maxillary, anterior ethmoid, and frontal sinuses with osteitis. Probable bilateral middle turbinate concha bullosa with opacification. No destructive  changes or sinus expansion where visualized. Visualized orbits: Bilateral cataract resection Brain: No  evidence of acute infarction, hemorrhage, hydrocephalus, or mass lesion/mass effect. Patchy low-density in the bilateral cerebral white matter compatible with moderate chronic small vessel disease. Normal cerebral volume for age. IMPRESSION: 1. No acute or posttraumatic finding. 2. Chronic small vessel disease. 3. 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 Electronically Signed   By: Monte Fantasia M.D.   On: 12/16/2015 10:23    EKG:   Orders placed or performed in visit on 09/13/14  . EKG 12-Lead    ASSESSMENT AND PLAN:   [name] is a 82 y.o. female with a known history of chronic infection of the left eye, hypertension, GERD, hypothyroidism comes to the emergency room after she had a mechanical fall accident which independent living.  1. Acute right seventh and eighth and ninth rib fracture status post mechanical fall. -Continue pain medications as needed. -Physical therapy recommendation is for SNF -Incentive spirometer -Social worker for discharge planning  2. Accelerated hypertension: Fair control at this time - Continue metoprolol, Altace - Continue pain control  3. GERD Continue PPI  4. Chronic left eye staph infection We'll continue her antibiotic eyedrops as directed by her ophthalmologist  5. Hyponatremia- - Discontinue IV fluids  6. DVT prophylaxis heparin on Lovenox   All the records are reviewed and case discussed with Care Management/Social Workerr. Management plans discussed with the patient, family and they are in agreement.  CODE STATUS: DO NOT RESUSCITATE  TOTAL TIME TAKING CARE OF THIS PATIENT: 25 minutes.   POSSIBLE D/C IN 1-2 DAYS, DEPENDING ON CLINICAL CONDITION.   Myrtis Ser M.D on 12/17/2015 at 4:00 PM  Between 7am to 6pm - Pager - [phone]  After 6pm go to [email or web address] - password [email or web address] [facility]  Office  [phone]  CC: Primary care physician; Wilhemena Durie, MD

## 2015-12-18 DIAGNOSIS — H16072 Perforated corneal ulcer, left eye: Secondary | ICD-10-CM

## 2015-12-18 LAB — CBC
HEMATOCRIT: 36.8 % (ref 35.0–47.0)
Hemoglobin: 12.7 g/dL (ref 12.0–16.0)
MCH: 31.1 pg (ref 26.0–34.0)
MCHC: 34.7 g/dL (ref 32.0–36.0)
MCV: 89.7 fL (ref 80.0–100.0)
PLATELETS: 276 10*3/uL (ref 150–440)
RBC: 4.1 MIL/uL (ref 3.80–5.20)
RDW: 13.5 % (ref 11.5–14.5)
WBC: 10.2 10*3/uL (ref 3.6–11.0)

## 2015-12-18 LAB — BASIC METABOLIC PANEL
ANION GAP: 6 (ref 5–15)
BUN: 16 mg/dL (ref 6–20)
CALCIUM: 8.1 mg/dL — AB (ref 8.9–10.3)
CO2: 26 mmol/L (ref 22–32)
Chloride: 88 mmol/L — ABNORMAL LOW (ref 101–111)
Creatinine, Ser: <0.3 mg/dL — ABNORMAL LOW (ref 0.44–1.00)
GLUCOSE: 108 mg/dL — AB (ref 65–99)
Potassium: 4.1 mmol/L (ref 3.5–5.1)
Sodium: 120 mmol/L — ABNORMAL LOW (ref 135–145)

## 2015-12-18 LAB — SODIUM
SODIUM: 119 mmol/L — AB (ref 135–145)
Sodium: 119 mmol/L — CL (ref 135–145)

## 2015-12-18 LAB — SEDIMENTATION RATE: Sed Rate: 29 mm/hr (ref 0–30)

## 2015-12-18 MED ORDER — OFLOXACIN 0.3 % OP SOLN: 1.0000 [drp] | Freq: Four times a day (QID) | OPHTHALMIC | Status: DC

## 2015-12-18 MED ORDER — SODIUM CHLORIDE 0.9 % IV SOLN: INTRAVENOUS | Status: DC

## 2015-12-18 MED ORDER — AMLODIPINE BESYLATE 5 MG PO TABS: 5.0000 mg | ORAL_TABLET | Freq: Every day | ORAL | Status: DC

## 2015-12-18 MED ORDER — NONFORMULARY OR COMPOUNDED ITEM: 1.0000 [drp] | Freq: Four times a day (QID) | Status: DC

## 2015-12-18 MED ORDER — ENSURE ENLIVE PO LIQD: 237.0000 mL | Freq: Three times a day (TID) | ORAL | Status: DC

## 2015-12-18 MED ORDER — PREDNISOLONE ACETATE 1 % OP SUSP: 1.0000 [drp] | Freq: Four times a day (QID) | OPHTHALMIC | Status: DC

## 2015-12-18 MED ORDER — TOLVAPTAN 15 MG PO TABS
15.0000 mg | ORAL_TABLET | ORAL | Status: DC
  Administered 2015-12-18 – 2015-12-20 (×3): 15 mg via ORAL
  Filled 2015-12-18 (×4): qty 1

## 2015-12-18 MED ORDER — BRIMONIDINE TARTRATE 0.15 % OP SOLN
1.0000 [drp] | Freq: Two times a day (BID) | OPHTHALMIC | Status: DC
  Administered 2015-12-18 – 2015-12-21 (×6): 1 [drp] via OPHTHALMIC
  Filled 2015-12-18: qty 5

## 2015-12-18 MED ORDER — SODIUM CHLORIDE 0.9 % IV SOLN
INTRAVENOUS | Status: DC
  Administered 2015-12-18: 10:00:00 via INTRAVENOUS

## 2015-12-18 NOTE — Progress Notes (Signed)
[name] NAME: [name]    MR#:  [id number]  DATE OF BIRTH:  [birth year]  SUBJECTIVE:  CHIEF COMPLAINT:   Chief Complaint  Patient presents with  . Fall  . Back Pain   -Admitted after a fall and right-sided rib pain. Noted to have right-sided seventh eighth and ninth rib fractures. Pain controlled. She has been up in the chair. Doing well with physical therapy.  REVIEW OF SYSTEMS:  Review of Systems  Constitutional: Negative for fever and chills.  HENT: Negative for ear discharge, ear pain and nosebleeds.   Eyes: Negative for blurred vision.  Respiratory: Negative for cough, shortness of breath and wheezing.   Cardiovascular: Negative for chest pain and palpitations.  Gastrointestinal: Negative for nausea, vomiting, abdominal pain, diarrhea and constipation.  Genitourinary: Negative for dysuria.  Musculoskeletal: Positive for myalgias and back pain.  Neurological: Negative for dizziness, seizures and headaches.    DRUG ALLERGIES:  No Known Allergies  VITALS:  Blood pressure 164/71, pulse 57, temperature 97.9 F (36.6 C), temperature source Oral, resp. rate 18, height 5\' 2"  (1.575 m), weight 47.174 kg (104 lb), SpO2 96 %.  PHYSICAL EXAMINATION:  Physical Exam  GENERAL:  82 y.o.-year-old patient lying in the bed with no acute distress. Eating breakfast EYES: Conjunctival injection on the left, she keeps the eye closed, right pupil reactive conjunctiva clear HEENT: Head atraumatic, normocephalic. Oropharynx and nasopharynx clear.  NECK:  Supple, no jugular venous distention. No thyroid enlargement, no tenderness.  LUNGS: Normal breath sounds bilaterally, no wheezing, rales,rhonchi or crepitation. No use of accessory muscles of respiration. Decreased bibasilar breath sounds. CARDIOVASCULAR: S1, S2 normal. Norubs, or gallops. 3/6 systolic murmur present ABDOMEN: Soft, nontender, nondistended. Bowel sounds  present. No organomegaly or mass.  EXTREMITIES: No pedal edema, cyanosis, or clubbing. Pulses 2+ NEUROLOGIC: Cranial nerves II through XII are intact. Muscle strength 5/5 in all extremities. Sensation intact. Gait not checked.  PSYCHIATRIC: The patient is alert and oriented x 3.  SKIN: No obvious rash, lesion, or ulcer.    LABORATORY PANEL:   CBC  Recent Labs Lab 12/18/15 0555  WBC 10.2  HGB 12.7  HCT 36.8  PLT 276   ------------------------------------------------------------------------------------------------------------------  Chemistries   Recent Labs Lab 12/14/15 1308  12/18/15 0555  NA 128*  < > 120*  K 4.0  < > 4.1  CL 94*  < > 88*  CO2 26  < > 26  GLUCOSE 106*  < > 108*  BUN 18  < > 16  CREATININE 0.53  < > <0.30*  CALCIUM 8.8*  < > 8.1*  AST 26  --   --   ALT 16  --   --   ALKPHOS 63  --   --   BILITOT 0.8  --   --   < > = values in this interval not displayed. ------------------------------------------------------------------------------------------------------------------  Cardiac Enzymes No results for input(s): TROPONINI in the last 168 hours. ------------------------------------------------------------------------------------------------------------------  RADIOLOGY:  No results found.  EKG:   Orders placed or performed in visit on 09/13/14  . EKG 12-Lead    ASSESSMENT AND PLAN:   [name] is a 82 y.o. 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  1. Acute right seventh and eighth and ninth rib fracture status post mechanical fall. -Continue pain medications as  needed. Pain much better controlled today, has been up in the chair. -Physical therapy recommendation is for SNF -Incentive spirometer -Social worker for discharge planning. She [facility] a bed at [facility]  nursing facility. Family has been given contact information for the admissions coordinator at that facility and will call when she is discharged.  2. Accelerated hypertension: Controlled at this time - Continue metoprolol, Altace - Continue pain control  3. GERD Continue PPI  4. Corneal perforation left eye: Her ophthalmologist Dr. Jeni Salles has seen her today in the ED ophthalmology suite and determined that her corneal laceration is now a corneal perforation. He has recommended urgent transfer to a tertiary care center. I have spoken with both [facility] and [facility] who are on day of her and have waiting list. She is on the waiting list at both institutions for a medical bed with ophthalmology consultation. Both ophthalmology services have been notified.  5. Hyponatremia- - appreciate nephrology consultation. IV fluids have been stopped yesterday. Have been resumed today. We'll recheck sodium this afternoon. She is not taking in much by mouth.  6. DVT prophylaxis heparin on Lovenox   All the records are reviewed and case discussed with Care Management/Social Workerr. Management plans discussed with the patient, family and they are in agreement.  CODE STATUS: DO NOT RESUSCITATE  TOTAL TIME TAKING CARE OF THIS PATIENT: 45 minutes.   POSSIBLE D/C IN 1-2 DAYS, DEPENDING ON CLINICAL CONDITION.   [name] M.D on 12/18/2015 at 3:00 PM  Between 7am to 6pm - Pager - [phone]  After 6pm go to [email or web address] - password [email or web address] [place] [facility]  Office  [phone]  CC: Primary care physician; Ana Durie, MD

## 2015-12-18 NOTE — Progress Notes (Signed)
tolvaptan med guide given to pt and family

## 2015-12-18 NOTE — Discharge Summary (Addendum)
[place] [facility] [place] NAME: [name]    MR#:  [id number]  DATE OF BIRTH:  1914/10/14  DATE OF ADMISSION:  12/14/2015 ADMITTING PHYSICIAN: Fritzi Mandes, MD  DATE OF DISCHARGE: 12/21/2015  PRIMARY CARE PHYSICIAN: Wilhemena Durie, MD    ADMISSION DIAGNOSIS:  Weakness [R53.1] Rib fractures, right, closed, initial encounter [S22.31XA]  DISCHARGE DIAGNOSIS:  Active Problems:   Rib fracture   Hyponatremia   Corneal perforation of left eye   SECONDARY DIAGNOSIS:   Past Medical History  Diagnosis Date  . Eye infection   . Falls   . Pelvic fracture [facility])     HOSPITAL COURSE:   [name] is a 82 y.o. female with a known history of chronic infection/corneal laceration of the left eye, hypertension, GERD, hypothyroidism comes to the emergency room after she had a mechanical fall which occurred at independent living.  1. Acute right seventh and eighth and ninth rib fracture status post mechanical fall. -Continue pain medications as needed. Pain much better controlled today, has been up in the chair. -Physical therapy recommendation is for SNF -Incentive spirometer -Social worker for discharge planning. She has a bed at [facility] skilled nursing facility. Family has been given contact information for the admissions coordinator at that facility and will call when she is discharged.  2. Accelerated hypertension: Controlled at this time - Continue metoprolol, Altace - Continue pain control  3. GERD Continue PPI  4. Corneal perforation left eye: Her ophthalmologist Dr. Jeni Salles has seen her today (he transported her to the ED ophthalmology suite for examination) and determined that her corneal laceration is now a corneal perforation. He has recommended urgent transfer to a tertiary care center. Pt is accepted and will have bed today at [facility] for transfer on medical service and ophthalmology as consulting  service.  5. Hyponatremia- - Appreciate nephrology consultation. IV fluids - on recheck it came up to >130. - on tolvaptan by nephrology. - she may need frequent sodium checks after discharge.    6. DVT prophylaxis heparin on Lovenox  DISCHARGE CONDITIONS:   Stable  CONSULTS OBTAINED:  Treatment Team:  Lavonia Dana, MD  DRUG ALLERGIES:  No Known Allergies  DISCHARGE MEDICATIONS:   Current Discharge Medication List    START taking these medications   Details  amLODipine (NORVASC) 10 MG tablet Take 1 tablet (10 mg total) by mouth daily. Qty: 30 tablet, Refills: 0    brimonidine (ALPHAGAN) 0.15 % ophthalmic solution Place 1 drop into the left eye 2 (two) times daily. Qty: 5 mL, Refills: 12    feeding supplement, ENSURE ENLIVE, (ENSURE ENLIVE) LIQD Take 237 mLs by mouth 3 (three) times daily with meals. Qty: 237 mL, Refills: 12    NONFORMULARY OR COMPOUNDED ITEM Place 1 drop into the left eye 4 (four) times daily. Qty: 1 each, Refills: 0    ofloxacin (OCUFLOX) 0.3 % ophthalmic solution Place 1 drop into the left eye 4 (four) times daily. Qty: 5 mL, Refills: 0    oxyCODONE-acetaminophen (PERCOCET/ROXICET) 5-325 MG tablet Take 1 tablet by mouth every 8 (eight) hours as needed for moderate pain or severe pain. Qty: 30 tablet, Refills: 0    prednisoLONE acetate (PRED FORTE) 1 % ophthalmic suspension Place 1 drop into the left eye 4 (four) times daily. Qty: 5 mL, Refills: 0    tolvaptan (SAMSCA) 15 MG TABS tablet Take 1 tablet (15 mg total) by mouth daily. Qty: 30  tablet, Refills: 0      CONTINUE these medications which have NOT CHANGED   Details  DUREZOL 0.05 % EMUL Place 1 drop into the left eye 4 (four) times daily.  Refills: 0    levobunolol (BETAGAN) 0.5 % ophthalmic solution Place 1 drop into both eyes 2 (two) times daily.     levofloxacin (QUIXIN) 0.5 % ophthalmic solution Place 1 drop into the left eye 3 (three) times daily.     levothyroxine (SYNTHROID,  LEVOTHROID) 50 MCG tablet Take 1 tablet (50 mcg total) by mouth daily. Qty: 30 tablet, Refills: 0    metoprolol tartrate (LOPRESSOR) 25 MG tablet Take 2 tablets (50 mg total) by mouth daily. Qty: 30 tablet, Refills: 0    pantoprazole (PROTONIX) 40 MG tablet Take 40 mg by mouth 2 (two) times daily.    ramipril (ALTACE) 5 MG capsule Take 1 capsule (5 mg total) by mouth daily. Qty: 30 capsule, Refills: 0      STOP taking these medications     PRESCRIPTION MEDICATION          DISCHARGE INSTRUCTIONS:    DIET:  Regular diet  DISCHARGE CONDITION:  Stable  ACTIVITY:  Activity as tolerated  OXYGEN:  Home Oxygen: No.   Oxygen Delivery: room air  DISCHARGE LOCATION:  Discharge to [facility] for further care.   If you experience worsening of your admission symptoms, develop shortness of breath, life threatening emergency, suicidal or homicidal thoughts you must seek medical attention immediately by calling 911 or calling your MD immediately  if symptoms less severe.  You Must read complete instructions/literature along with all the possible adverse reactions/side effects for all the Medicines you take and that have been prescribed to you. Take any new Medicines after you have completely understood and accpet all the possible adverse reactions/side effects.   Please note  You were cared for by a hospitalist during your hospital stay. If you have any questions about your discharge medications or the care you received while you were in the hospital after you are discharged, you can call the unit and asked to speak with the hospitalist on call if the hospitalist that took care of you is not available. Once you are discharged, your primary care physician will handle any further medical issues. Please note that NO REFILLS for any discharge medications will be authorized once you are discharged, as it is imperative that you return to your primary care physician (or establish a relationship with  a primary care physician if you do not have one) for your aftercare needs so that they can reassess your need for medications and monitor your lab values.    Today   CHIEF COMPLAINT:   Chief Complaint  Patient presents with  . Fall  . Back Pain    HISTORY OF PRESENT ILLNESS:  [name] is a 82 y.o. female with a known history of chronic infection of the left eye, hypertension, GERD, hypothyroidism comes to the emergency room after she had a mechanical fall accident which independent living. Patient says she tried to get up using her walker however ended up getting tangled with her walker and had a mechanical fall. She started having significant amount of right sided rib pain and in the emergency room was found to have several right sided rib fractures. Patient was unable to ambulate and due to significant pain and is being admitted for further management.  VITAL SIGNS:  Blood pressure 111/59, pulse 55, temperature 97.7 F (36.5 C),  temperature source Oral, resp. rate 20, height 5\' 2"  (1.575 m), weight 47.174 kg (104 lb), SpO2 96 %.  I/O:    Intake/Output Summary (Last 24 hours) at 12/21/15 1639 Last data filed at 12/21/15 1200  Gross per 24 hour  Intake    360 ml  Output    300 ml  Net     60 ml    PHYSICAL EXAMINATION:   GENERAL: 82 y.o.-year-old patient lying in the bed with no acute distress. Eating breakfast EYES: Conjunctival injection on the left, she keeps the eye closed, right pupil reactive conjunctiva clear HEENT: Head atraumatic, normocephalic. Oropharynx and nasopharynx clear.  NECK: Supple, no jugular venous distention. No thyroid enlargement, no tenderness.  LUNGS: Normal breath sounds bilaterally, no wheezing, rales,rhonchi or crepitation. No use of accessory muscles of respiration. Decreased bibasilar breath sounds. CARDIOVASCULAR: S1, S2 normal. Norubs, or gallops. 3/6 systolic murmur present ABDOMEN: Soft, nontender, nondistended. Bowel sounds  present. No organomegaly or mass.  EXTREMITIES: No pedal edema, cyanosis, or clubbing. Pulses 2+ NEUROLOGIC: Cranial nerves II through XII are intact. Muscle strength 5/5 in all extremities. Sensation intact. Gait not checked.  PSYCHIATRIC: The patient is alert and oriented x 3.  SKIN: No obvious rash, lesion, or ulcer.    DATA REVIEW:   CBC  Recent Labs Lab 12/19/15 0514  WBC 11.4*  HGB 13.5  HCT 39.6  PLT 299    Chemistries   Recent Labs Lab 12/21/15 0508  NA 132*  K 3.3*  CL 95*  CO2 30  GLUCOSE 110*  BUN 16  CREATININE 0.42*  CALCIUM 8.6*    Cardiac Enzymes No results for input(s): TROPONINI in the last 168 hours.  Microbiology Results  Results for orders placed or performed during the hospital encounter of 12/01/15  Wound culture     Status: None   Collection Time: 12/01/15  4:05 PM  Result Value Ref Range Status   Specimen Description WOUND  Final   Special Requests NONE  Final   Culture NO GROWTH 4 DAYS  Final   Report Status 12/05/2015 FINAL  Final    RADIOLOGY:  No results found.  EKG:   Orders placed or performed in visit on 09/13/14  . EKG 12-Lead      Management plans discussed with the patient, family and they are in agreement.  CODE STATUS:     Code Status Orders        Start     Ordered   12/14/15 1453  Do not attempt resuscitation (DNR)   Continuous    Question Answer Comment  In the event of cardiac or respiratory ARREST Do not call a "code blue"   In the event of cardiac or respiratory ARREST Do not perform Intubation, CPR, defibrillation or ACLS   In the event of cardiac or respiratory ARREST Use medication by any route, position, wound care, and other measures to relive pain and suffering. May use oxygen, suction and manual treatment of airway obstruction as needed for comfort.      12/14/15 1452    Advance Directive Documentation        Most Recent Value   Type of Advance Directive  Healthcare Power of Attorney    Pre-existing out of facility DNR order (yellow form or pink MOST form)     "MOST" Form in Place?        TOTAL TIME TAKING CARE OF THIS PATIENT: 45 minutes.  Greater than 50% of time spent in care coordination and  counseling.  Vaughan Basta M.D on 12/21/2015 at 4:39 PM  Between 7am to 6pm - Pager - [phone]  After 6pm go to [email or web address] - password [email or web address] [place] [facility]  Office  [phone]  CC: Primary care physician; Wilhemena Durie, MD

## 2015-12-18 NOTE — Progress Notes (Signed)
Dr Juleen China made aware of Na 119, new order to discontinue NS, MD states that he will place new orders

## 2015-12-18 NOTE — Care Management Important Message (Signed)
Important Message  Patient Details  Name: [name] MRN: [id number] Date of Birth: Mar 25, 1914   Medicare Important Message Given:  Yes    Juliann Pulse A Jourden Gilson 12/18/2015, 10:48 AM

## 2015-12-18 NOTE — Clinical Social Work Note (Signed)
CSW was updated by MD that pt will be transferred to [facility] (depending on bed availability) for further medical work up. CSW updated admissions coordinator, Lawerance Sabal at [facility] [phone]). Pt still has a bed when pt is discharged from acute facility. CSW updated pt's family and they have been instructed to contact facility when pt is ready for discharge by Ms. Maness. Family is agreeable. CSW will follow for remainder of admission.   Darden Dates, MSW, LCSW  Clinical Social Worker  [phone]

## 2015-12-18 NOTE — Care Management (Signed)
Spoke with Dr. Volanda Napoleon. Dr. Wilburn Cornelia has accepted [name] at [facility]. [name] is 6th on the waiting list.  Discussed discharge summary and Emtela form with Dr. Volanda Napoleon. Will start transfer packet. Family at the bedside. Shelbie Ammons RN MSN CCM Care Management [phone]

## 2015-12-18 NOTE — Progress Notes (Signed)
Dr Jeni Salles called with orders to hold all night time eye drops and new order to give alphagan eye drop

## 2015-12-18 NOTE — Consult Note (Signed)
[facility]  CONSULT NOTE    Date: 12/18/2015                  Patient Name:  [name]  MRN: [id number]  DOB: [birth year]  Age / Sex: 82 y.o., female         PCP: Wilhemena Durie, MD                 Service Requesting Consult: Dr. Volanda Napoleon                 Reason for Consult: Hyponatremia            History of Present Illness: [name] is a 82 y.o. white  female with chronic left eye infection, hypertension, GERD, hypothyroidism, who was admitted to [facility] on 12/14/2015 for Weakness [R53.1] Rib fractures, right, closed, initial encounter [S22.31XA]   Patient feel and had right sided rib fractures. She was admitted with a serum sodium of 127 and then after her NS IV fluids were stopped, her sodium went down to 120. Nephrology then called. She has a undetected creatinine in urine.   Patient states she feels weak, tired and not able to eat. She is experiencing dysguesia and nausea. Family at bedside, reports poor appetite for several days before admission.    Medications: Outpatient medications: Prescriptions prior to admission  Medication Sig Dispense Refill Last Dose  . DUREZOL 0.05 % EMUL Place 1 drop into the left eye 4 (four) times daily.   0 12/14/2015 at Unknown time  . levobunolol (BETAGAN) 0.5 % ophthalmic solution Place 1 drop into both eyes 2 (two) times daily.    12/14/2015 at Unknown time  . levofloxacin (QUIXIN) 0.5 % ophthalmic solution Place 1 drop into the left eye 3 (three) times daily.    12/14/2015 at Unknown time  . levothyroxine (SYNTHROID, LEVOTHROID) 50 MCG tablet Take 1 tablet (50 mcg total) by mouth daily. 30 tablet 0 12/14/2015 at Unknown time  . metoprolol tartrate (LOPRESSOR) 25 MG tablet Take 2 tablets (50 mg total) by mouth daily. 30 tablet 0 12/14/2015 at [facility]   . pantoprazole (PROTONIX) 40 MG tablet Take 40 mg by mouth 2 (two) times daily.   12/14/2015 at Unknown time  . [EXPIRED] PRESCRIPTION MEDICATION Place 1 drop into the left  eye 4 (four) times daily. Fortified Vancomycin Ophthalmic solution.   12/14/2015 at Unknown time  . ramipril (ALTACE) 5 MG capsule Take 1 capsule (5 mg total) by mouth daily. 30 capsule 0 12/14/2015 at Unknown time    Current medications: Current Facility-Administered Medications  Medication Dose Route Frequency Provider Last Rate Last Dose  . 0.9 %  sodium chloride infusion  250 mL Intravenous PRN Fritzi Mandes, MD      . 0.9 %  sodium chloride infusion   Intravenous Continuous Aldean Jewett, MD 75 mL/hr at 12/18/15 0936    . acetaminophen (TYLENOL) tablet 650 mg  650 mg Oral Q6H PRN Fritzi Mandes, MD   650 mg at 12/18/15 1337   Or  . acetaminophen (TYLENOL) suppository 650 mg  650 mg Rectal Q6H PRN Fritzi Mandes, MD      . amLODipine (NORVASC) tablet 5 mg  5 mg Oral Daily Gladstone Lighter, MD   5 mg at 12/18/15 0921  . antiseptic oral rinse (CPC / CETYLPYRIDINIUM CHLORIDE 0.05%) solution 7 mL  7 mL Mouth Rinse q12n4p Gladstone Lighter, MD   7 mL at 12/18/15 1128  . bisacodyl (DULCOLAX) EC  tablet 5 mg  5 mg Oral Daily PRN Fritzi Mandes, MD      . docusate sodium (COLACE) capsule 100 mg  100 mg Oral BID Fritzi Mandes, MD   100 mg at 12/18/15 0921  . enoxaparin (LOVENOX) injection 30 mg  30 mg Subcutaneous Q24H Fritzi Mandes, MD   30 mg at 12/17/15 2152  . feeding supplement (ENSURE ENLIVE) (ENSURE ENLIVE) liquid 237 mL  237 mL Oral TID WC Gladstone Lighter, MD   237 mL at 12/18/15 0922  . hydrALAZINE (APRESOLINE) injection 10 mg  10 mg Intravenous Q4H PRN Lytle Butte, MD   10 mg at 12/16/15 0551  . levobunolol (BETAGAN) 0.5 % ophthalmic solution 1 drop  1 drop Right Eye BID Fritzi Mandes, MD   1 drop at 12/18/15 0931  . levothyroxine (SYNTHROID, LEVOTHROID) tablet 50 mcg  50 mcg Oral QAC breakfast Fritzi Mandes, MD   50 mcg at 12/18/15 0921  . magnesium hydroxide (MILK OF MAGNESIA) suspension 30 mL  30 mL Oral Daily PRN Gladstone Lighter, MD      . metaxalone [facility]) tablet 400 mg  400 mg Oral TID Gladstone Lighter, MD   400 mg at 12/18/15 0921  . metoprolol (LOPRESSOR) tablet 50 mg  50 mg Oral Daily Fritzi Mandes, MD   50 mg at 12/18/15 0921  . morphine 2 MG/ML injection 1 mg  1 mg Intravenous Q4H PRN Fritzi Mandes, MD   1 mg at 12/16/15 0936  . ofloxacin (OCUFLOX) 0.3 % ophthalmic solution 1 drop  1 drop Left Eye QID Fritzi Mandes, MD   1 drop at 12/18/15 1336  . ondansetron (ZOFRAN) tablet 4 mg  4 mg Oral Q6H PRN Fritzi Mandes, MD   4 mg at 12/17/15 0938   Or  . ondansetron (ZOFRAN) injection 4 mg  4 mg Intravenous Q6H PRN Fritzi Mandes, MD      . pantoprazole (PROTONIX) EC tablet 40 mg  40 mg Oral BID Fritzi Mandes, MD   40 mg at 12/18/15 0921  . polyethylene glycol (MIRALAX / GLYCOLAX) packet 17 g  17 g Oral Daily Aldean Jewett, MD   17 g at 12/18/15 0920  . prednisoLONE acetate (PRED FORTE) 1 % ophthalmic suspension 1 drop  1 drop Left Eye QID Estill Cotta, MD   1 drop at 12/18/15 1336  . ramipril (ALTACE) capsule 5 mg  5 mg Oral Daily Fritzi Mandes, MD   5 mg at 12/18/15 0920  . sodium chloride 0.9 % injection 3 mL  3 mL Intravenous Q12H Fritzi Mandes, MD   3 mL at 12/18/15 0923  . sodium chloride 0.9 % injection 3 mL  3 mL Intravenous PRN Fritzi Mandes, MD      . traMADol Veatrice Bourbon) tablet 50 mg  50 mg Oral Q6H PRN Gladstone Lighter, MD   50 mg at 12/18/15 0219  . vancomycin 50 mg/mL ophthalmic solution  1 drop Left Eye QID Fritzi Mandes, MD   1 drop at 12/18/15 1336      Allergies: No Known Allergies    Past Medical History: Past Medical History  Diagnosis Date  . Eye infection   . Falls   . Pelvic fracture [facility])      Past Surgical History: Past Surgical History  Procedure Laterality Date  . Abdominal hysterectomy    . Cesarean section       Family History: History reviewed. No pertinent family history.   Social History: Social History   Social History  .  Marital Status: Widowed    Spouse Name: N/A  . Number of Children: N/A  . Years of Education: N/A   Occupational History  .  Not on file.   Social History Main Topics  . Smoking status: Never Smoker   . Smokeless tobacco: Never Used  . Alcohol Use: No  . Drug Use: No  . Sexual Activity: Not on file   Other Topics Concern  . Not on file   Social History Narrative     Review of Systems: Review of Systems  Constitutional: Positive for weight loss and malaise/fatigue. Negative for fever, chills and diaphoresis.  HENT: Negative for congestion, ear discharge, ear pain, hearing loss, nosebleeds, sore throat and tinnitus.   Eyes: Positive for blurred vision, double vision, photophobia, pain, discharge and redness.  Respiratory: Negative.  Negative for cough, hemoptysis, sputum production, shortness of breath, wheezing and stridor.   Cardiovascular: Negative.  Negative for chest pain, palpitations, orthopnea, claudication, leg swelling and PND.  Gastrointestinal: Negative.  Negative for heartburn, nausea, vomiting, abdominal pain, diarrhea, constipation, blood in stool and melena.  Genitourinary: Negative.  Negative for dysuria, urgency, frequency, hematuria and flank pain.  Musculoskeletal: Negative.  Negative for myalgias, back pain, joint pain, falls and neck pain.  Skin: Negative for itching and rash.  Neurological: Positive for weakness and headaches. Negative for dizziness, tingling, tremors, sensory change, speech change, focal weakness, seizures and loss of consciousness.  Endo/Heme/Allergies: Negative for environmental allergies and polydipsia. Does not bruise/bleed easily.  Psychiatric/Behavioral: Negative.  Negative for depression, suicidal ideas, hallucinations, memory loss and substance abuse. The patient is not nervous/anxious and does not have insomnia.     Vital Signs: Blood pressure 164/71, pulse 57, temperature 97.9 F (36.6 C), temperature source Oral, resp. rate 18, height 5\' 2"  (1.575 m), weight 47.174 kg (104 lb), SpO2 96 %.  Weight trends: Filed Weights   12/14/15 [id number] 12/14/15 1451   Weight: 48.988 kg (108 lb) 47.174 kg (104 lb)    Physical Exam: General: NAD, elderly white female  Head: Normocephalic, atraumatic. Moist oral mucosal membranes  Eyes: Right eye pupil round and reactive, left eye closed  Neck: Supple, trachea midline  Lungs:  Clear to auscultation  Heart: Regular rate and rhythm  Abdomen:  Soft, nontender,   Extremities: no peripheral edema.  Neurologic: Nonfocal, moving all four extremities  Skin: No lesions        Lab results: Basic Metabolic Panel:  Recent Labs Lab 12/16/15 0554 12/17/15 0407 12/18/15 0555  NA 127* 127* 120*  K 3.6 4.1 4.1  CL 95* 96* 88*  CO2 24 24 26   GLUCOSE 122* 116* 108*  BUN 12 22* 16  CREATININE 0.39* 0.33* <0.30*  CALCIUM 8.3* 8.1* 8.1*    Liver Function Tests:  Recent Labs Lab 12/14/15 1308  AST 26  ALT 16  ALKPHOS 63  BILITOT 0.8  PROT 6.6  ALBUMIN 3.6   No results for input(s): LIPASE, AMYLASE in the last 168 hours. No results for input(s): AMMONIA in the last 168 hours.  CBC:  Recent Labs Lab 12/14/15 1308 12/17/15 0407 12/18/15 0555  WBC 14.0* 9.9 10.2  HGB 12.9 12.1 12.7  HCT 38.3 35.6 36.8  MCV 89.8 88.8 89.7  PLT 263 244 276    Cardiac Enzymes: No results for input(s): CKTOTAL, CKMB, CKMBINDEX, TROPONINI in the last 168 hours.  BNP: Invalid input(s): POCBNP  CBG: No results for input(s): GLUCAP in the last 168 hours.  Microbiology: Results for orders placed or  performed during the hospital encounter of 12/01/15  Wound culture     Status: None   Collection Time: 12/01/15  4:05 PM  Result Value Ref Range Status   Specimen Description WOUND  Final   Special Requests NONE  Final   Culture NO GROWTH 4 DAYS  Final   Report Status 12/05/2015 FINAL  Final    Coagulation Studies: No results for input(s): LABPROT, INR in the last 72 hours.  Urinalysis: No results for input(s): COLORURINE, LABSPEC, PHURINE, GLUCOSEU, HGBUR, BILIRUBINUR, KETONESUR, PROTEINUR,  UROBILINOGEN, NITRITE, LEUKOCYTESUR in the last 72 hours.  Invalid input(s): APPERANCEUR    Imaging:  No results found.   Assessment & Plan: [name] is a 82 y.o. white  female with chronic left eye infection, hypertension, GERD, hypothyroidism, who was admitted to [facility] on 12/14/2015 for Weakness [R53.1] Rib fractures, right, closed, initial encounter [S22.31XA]   1. Hyponatremia: acute on chronic. Na 120. Severe. Hypovolumic. Hypo-osmolar. Concern for a tea and toast diet like presentation.  - urine sodium will be off due to IV fluids.  - Agree with IV fluids: NS - check sodium level now.   2. Hypertension: hold blood pressure medications for now. Hypotensive and with recent falls.      LOS: 2 Liandro Thelin 1/6/20172:06 PM

## 2015-12-18 NOTE — Progress Notes (Signed)
Physical Therapy Treatment Patient Details Name: [name] MRN: [id number] DOB: 15-Mar-1914 Today's Date: 12/18/2015    History of Present Illness [name] is a 82 y.o. female with a known history of chronic infection of the left eye, hypertension, GERD, hypothyroidism comes to the emergency room after she had a mechanical fall accident which independent living. Patient says she tried to get up using her walker however ended up getting tangled with her walker and had a mechanical fall. She started having significant amount of right sided rib pain and in the emergency room was found to have several right sided rib fractures.    PT Comments    Pt is making good progress towards goals with improved ambulation distance this session. Still requires + 2 assist for sequencing and movement secondary to pain/weakness/fear of falling. Pt very motivated to perform therapy. Pt fatigues with increased ambulation. Correct use of RW demonstrated. Good endurance noted with there-ex.  Follow Up Recommendations  SNF     Equipment Recommendations       Recommendations for Other Services       Precautions / Restrictions Precautions Precautions: Fall Restrictions Weight Bearing Restrictions: No    Mobility  Bed Mobility Overal bed mobility: Needs Assistance Bed Mobility: Supine to Sit     Supine to sit: Mod assist     General bed mobility comments: Improved technique from previous date. Pt able to follow commands and slide B LE off bed, however needs significant assist for upper body and trunk mobility, requiring +2 at this time.  Transfers Overall transfer level: Needs assistance Equipment used: Rolling walker (2 wheeled) Transfers: Sit to/from Stand Sit to Stand: Mod assist         General transfer comment: Pt demonstrates safe technique, pushing from seated surface prior to standing. Needs tactile cues on sacrum to demonstrate upright posture. Safe technique  performed  Ambulation/Gait Ambulation/Gait assistance: Min assist Ambulation Distance (Feet): 8 Feet Assistive device: Rolling walker (2 wheeled) Gait Pattern/deviations: Step-to pattern     General Gait Details: +2 assist and manual cues given for sequencing including manual weight shifting in order to take steps. Pt very anxious and fearful of falling. Pt able to perform forward/post ambulation with +2 assist for sequencing. Short step to gait pattern performed.   Stairs            Wheelchair Mobility    Modified Rankin (Stroke Patients Only)       Balance                                    Cognition Arousal/Alertness: Awake/alert Behavior During Therapy: WFL for tasks assessed/performed Overall Cognitive Status: Within Functional Limits for tasks assessed                      Exercises Other Exercises Other Exercises: supine ther-ex performed including B LE x 12 reps including ankle pumps, quad sets, SLRs, SAQ, heel slides, and hip ab/add. Pt only requires supervision and minimal cues for sequencing and correct technique.    General Comments        Pertinent Vitals/Pain Pain Assessment: 0-10 Pain Score: 3  Pain Location: R ribs/flank Pain Descriptors / Indicators: Aching Pain Intervention(s): Limited activity within patient's tolerance    Home Living                      Prior Function  PT Goals (current goals can now be found in the care plan section) Acute Rehab PT Goals Patient Stated Goal: to walk better PT Goal Formulation: With patient/family Potential to Achieve Goals: Good Progress towards PT goals: Progressing toward goals    Frequency  7X/week    PT Plan Current plan remains appropriate    Co-evaluation             End of Session   Activity Tolerance: Patient limited by pain Patient left: in chair;with chair alarm set     Time: [id number] PT Time Calculation (min) (ACUTE ONLY): 23  min  Charges:  $Gait Training: 8-22 mins $Therapeutic Exercise: 8-22 mins                    G Codes:      Raed Schalk 01-10-2016, 10:50 AM  Greggory Stallion, PT, DPT [phone]

## 2015-12-18 NOTE — Plan of Care (Signed)
Problem: Safety: Goal: Ability to remain free from injury will improve Outcome: Progressing Pt high fall risk. Bed alarm in place. Up with assist to [facility].   Problem: Pain Managment: Goal: General experience of comfort will improve Outcome: Progressing Pt still complaining of headache. Head CT negative. Prn Tramadol and Tylenol given this shift with improvement. Pt did not complain of rib discomfort more about her headache which she said was on the left side in the back of her head. Pt described it as a throbbing headache.   Problem: Activity: Goal: Risk for activity intolerance will decrease Outcome: Progressing Pt up to [facility] so some progression in activity. She had been not getting up at all and using the bedpan to void. Pt encouraged to get up OOB with assistance. Pt fearful of falling.

## 2015-12-18 NOTE — Plan of Care (Signed)
Problem: Activity: Goal: Risk for activity intolerance will decrease Outcome: Progressing Plan of care progress: -nephrology consulting -monitor Na level -starts tolvaptan, med guide given to pt and family -pt to tx to [place] for left eye surgery per Dr Volanda Napoleon and Dr Dingledine -pt with c/o HA, PRN pain meds given with improvement -continues ABT eye drops -appetite poor -voices any needs

## 2015-12-19 LAB — BASIC METABOLIC PANEL
Anion gap: 10 (ref 5–15)
BUN: 13 mg/dL (ref 6–20)
CALCIUM: 8.5 mg/dL — AB (ref 8.9–10.3)
CO2: 26 mmol/L (ref 22–32)
CREATININE: 0.45 mg/dL (ref 0.44–1.00)
Chloride: 88 mmol/L — ABNORMAL LOW (ref 101–111)
GFR calc Af Amer: >60 mL/min (ref >60)
Glucose, Bld: 111 mg/dL — ABNORMAL HIGH (ref 65–99)
Potassium: 3.7 mmol/L (ref 3.5–5.1)
SODIUM: 124 mmol/L — AB (ref 135–145)

## 2015-12-19 LAB — CBC
HCT: 39.6 % (ref 35.0–47.0)
Hemoglobin: 13.5 g/dL (ref 12.0–16.0)
MCH: 30.6 pg (ref 26.0–34.0)
MCHC: 34.2 g/dL (ref 32.0–36.0)
MCV: 89.6 fL (ref 80.0–100.0)
PLATELETS: 299 10*3/uL (ref 150–440)
RBC: 4.42 MIL/uL (ref 3.80–5.20)
RDW: 13.2 % (ref 11.5–14.5)
WBC: 11.4 10*3/uL — ABNORMAL HIGH (ref 3.6–11.0)

## 2015-12-19 NOTE — Progress Notes (Signed)
Telephone call to [facility] [phone] spoke with Abbe Amsterdam; inquiring as to nonemergency transport for a BLS pt from [facility] to [place]; Phil advised that [facility] is currently only transporting critical pts due to current inclement weather in [place], [place] and [facility] in [place]; will review again this afternoon

## 2015-12-19 NOTE — Progress Notes (Signed)
Physical Therapy Treatment Patient Details Name: [name] MRN: [id number] DOB: 04/19/1914 Today's Date: 12/19/2015    History of Present Illness [name] is a 82 y.o. female with a known history of chronic infection of the left eye, hypertension, GERD, hypothyroidism comes to the emergency room after she had a mechanical fall accident which independent living. Patient says she tried to get up using her walker however ended up getting tangled with her walker and had a mechanical fall. She started having significant amount of right sided rib pain and in the emergency room was found to have several right sided rib fractures.    PT Comments    Pt shows very good effort with PT and needed only +1 assist.  She was able to walk farther than any other attempts, but continued to be limited with very slow, small steps and guarded activity t/o (though she didn't have any pain t/o session.)  Pt's family present and very supportive.  Pt did fatigue significantly but overall has shown good gains and effort.   Follow Up Recommendations  SNF     Equipment Recommendations       Recommendations for Other Services       Precautions / Restrictions Precautions Precautions: Fall Restrictions Weight Bearing Restrictions: No    Mobility  Bed Mobility Overal bed mobility: Needs Assistance Bed Mobility: Supine to Sit;Sit to Supine     Supine to sit: Min assist Sit to supine: Mod assist   General bed mobility comments: Pt shows great effort getting into and out of bed, she is slow and labored but does not have pain with the effort.    Transfers Overall transfer level: Needs assistance Equipment used: Rolling walker (2 wheeled) Transfers: Sit to/from Stand Sit to Stand: Min assist         General transfer comment: Pt needs cues for set up, hand placement and generally shows effort.  She is unable to get all the way up w/o assist but does initiate upward  push.  Ambulation/Gait Ambulation/Gait assistance: Min assist;Mod assist Ambulation Distance (Feet): 12 Feet Assistive device: Rolling walker (2 wheeled)       General Gait Details: Pt continues to be hunched and weak with ambulation and though she shows great effort she is very fatigued (and even slower) by the end.  She is highly motivated and though she is only able to take very small, guarded steps she showed great effort and motivation.    Stairs            Wheelchair Mobility    Modified Rankin (Stroke Patients Only)       Balance                                    Cognition Arousal/Alertness: Awake/alert Behavior During Therapy: WFL for tasks assessed/performed Overall Cognitive Status: Within Functional Limits for tasks assessed                      Exercises General Exercises - Lower Extremity Ankle Circles/Pumps: 10 reps;AROM Short Arc Quad: AROM;10 reps Heel Slides: Strengthening;Both;10 reps Hip ABduction/ADduction: Strengthening;10 reps;Both    General Comments        Pertinent Vitals/Pain Pain Score: 0-No pain    Home Living                      Prior Function  PT Goals (current goals can now be found in the care plan section) Progress towards PT goals: Progressing toward goals    Frequency  7X/week    PT Plan Current plan remains appropriate    Co-evaluation             End of Session Equipment Utilized During Treatment: Gait belt Activity Tolerance: Patient limited by fatigue Patient left: with bed alarm set;with family/visitor present;with call bell/phone within reach     Time: 1855-1922 PT Time Calculation (min) (ACUTE ONLY): 27 min  Charges:  $Gait Training: 8-22 mins $Therapeutic Exercise: 8-22 mins                    G Codes:     Wayne Both, PT, DPT [phone]  Kreg Shropshire 12/19/2015, 8:27 PM

## 2015-12-19 NOTE — Progress Notes (Signed)
Telephone call previously from [place] at [facility] to the floor advising that [facility] can not transport today due to inclement weather conditions; call [facility] again to see if they can transport this afternoon and then call Claiborne Billings back; telephone call to [place] [phone] spoke to Abbe Amsterdam; questioned availability of nonemegency BLS transport of pt from [facility] to [facility]; no transport today due to inclement weather conditions in the state.

## 2015-12-19 NOTE — Plan of Care (Signed)
Problem: Safety: Goal: Ability to remain free from injury will improve Outcome: Progressing Pt remains free from injury.  Falls precautions in place.  Pt calls with toileting needs.    Problem: Pain Managment: Goal: General experience of comfort will improve Outcome: Progressing Pt continues to have intermittent headaches that have been relieved with tramadol and tylenol throughout the night.  Problem: Skin Integrity: Goal: Risk for impaired skin integrity will decrease Outcome: Progressing Sacral foam dressing in place prophylactically.  Pt is experiencing some urge incontinence this shift and asked for an adult diaper.

## 2015-12-19 NOTE — Progress Notes (Signed)
Telephone call with Dr Anselm Jungling advising him that the pt would not be transferred to [facility] today due to no nonemergency transportation availability with the current inclement weather conditions

## 2015-12-19 NOTE — Progress Notes (Signed)
Pt has a bed available at [facility]. Transfer to [place].  Discussed with her daughter  In room.  Accepting physician- Dr. Cyndia Diver.

## 2015-12-19 NOTE — Progress Notes (Signed)
Telephone call from the [place] spoke with Claiborne Billings; pt has been accepted and bed will become available today; will need to look into nonemergency transportation availability due to inclement weather conditions currently in [place] and [facility] in [place]; will call local agencies and call [place] back with information

## 2015-12-19 NOTE — Progress Notes (Addendum)
Telephone call to [facility] [phone] spoke with Claiborne Billings, advising her that [facility] is not available for nonemergency transport today; Dr Chauncey Cruel. Dingeldein currently standing at the nurse's station; verbally advised him that there is no nonemergency transportation available for the pt to go to [place] today

## 2015-12-19 NOTE — Progress Notes (Signed)
[place] at [place] NAME: [name]    MR#:  [id number]  DATE OF BIRTH:  January 02, 1914  SUBJECTIVE:  CHIEF COMPLAINT:   Chief Complaint  Patient presents with  . Fall  . Back Pain   -Admitted after a fall and right-sided rib pain. Noted to have right-sided seventh eighth and ninth rib fractures. Pain controlled. No new complains.  REVIEW OF SYSTEMS:  Review of Systems  Constitutional: Negative for fever and chills.  HENT: Negative for ear discharge, ear pain and nosebleeds.   Eyes: Negative for blurred vision.  Respiratory: Negative for cough, shortness of breath and wheezing.   Cardiovascular: Negative for chest pain and palpitations.  Gastrointestinal: Negative for nausea, vomiting, abdominal pain, diarrhea and constipation.  Genitourinary: Negative for dysuria and frequency.  Musculoskeletal: Positive for myalgias and back pain.  Neurological: Negative for dizziness, tremors, seizures, weakness and headaches.  Psychiatric/Behavioral: Negative for depression.    DRUG ALLERGIES:  No Known Allergies  VITALS:  Blood pressure 173/82, pulse 76, temperature 97.7 F (36.5 C), temperature source Oral, resp. rate 18, height 5\' 2"  (1.575 m), weight 47.174 kg (104 lb), SpO2 92 %.  PHYSICAL EXAMINATION:  Physical Exam  GENERAL:  82 y.o.-year-old patient lying in the bed with no acute distress. Eating breakfast watching news. EYES: Conjunctival injection on the left, she keeps the left eye closed, right pupil reactive conjunctiva clear HEENT: Head atraumatic, normocephalic. Oropharynx and nasopharynx clear.  NECK:  Supple, no jugular venous distention. No thyroid enlargement, no tenderness.  LUNGS: Normal breath sounds bilaterally, no wheezing, rales,rhonchi or crepitation. No use of accessory muscles of respiration. Decreased bibasilar breath sounds. CARDIOVASCULAR: S1, S2 normal. Norubs, or gallops. 3/6 systolic murmur  present ABDOMEN: Soft, nontender, nondistended. Bowel sounds present. No organomegaly or mass.  EXTREMITIES: No pedal edema, cyanosis, or clubbing. Pulses 2+. NEUROLOGIC: Cranial nerves II through XII are intact. Muscle strength 5/5 in all extremities. Sensation intact. Gait not checked.  PSYCHIATRIC: The patient is alert and oriented x 3.  SKIN: No obvious rash, lesion, or ulcer.    LABORATORY PANEL:   CBC  Recent Labs Lab 12/19/15 0514  WBC 11.4*  HGB 13.5  HCT 39.6  PLT 299   ------------------------------------------------------------------------------------------------------------------  Chemistries   Recent Labs Lab 12/14/15 1308  12/19/15 0514  NA 128*  < > 124*  K 4.0  < > 3.7  CL 94*  < > 88*  CO2 26  < > 26  GLUCOSE 106*  < > 111*  BUN 18  < > 13  CREATININE 0.53  < > 0.45  CALCIUM 8.8*  < > 8.5*  AST 26  --   --   ALT 16  --   --   ALKPHOS 63  --   --   BILITOT 0.8  --   --   < > = values in this interval not displayed. ------------------------------------------------------------------------------------------------------------------  Cardiac Enzymes No results for input(s): TROPONINI in the last 168 hours. ------------------------------------------------------------------------------------------------------------------  RADIOLOGY:  No results found.  EKG:   Orders placed or performed in visit on 09/13/14  . EKG 12-Lead    ASSESSMENT AND PLAN:   [name] is a 82 y.o. 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  1. Acute right seventh and eighth and ninth rib fracture status post mechanical fall. -Continue  pain medications as needed. Pain much better controlled today, has been up in the chair. -Physical therapy recommendation is for SNF -Incentive spirometer -Social  worker for discharge planning.   2. Accelerated hypertension: Controlled at this time - Continue metoprolol, Altace - Continue pain control  3. GERD Continue PPI  4. Corneal perforation left eye: Her ophthalmologist Dr. Jeni Salles has seen her today in the ED ophthalmology suite and determined that her corneal laceration is now a corneal perforation. He has recommended urgent transfer to a tertiary care center. I have spoken with both [facility] and [facility] who accepted her and have her on waiting list. She is on the waiting list at both institutions for a medical bed with ophthalmology consultation. Both ophthalmology services have been notified. Waiting for transfer.  5. Hyponatremia- - appreciate nephrology consultation. IV fluids . She is not taking in much by mouth. - improved.  6. DVT prophylaxis heparin on Lovenox   All the records are reviewed and case discussed with Care Management/Social Workerr. Management plans discussed with the patient, family and they are in agreement.  CODE STATUS: DO NOT RESUSCITATE  TOTAL TIME TAKING CARE OF THIS PATIENT: 35 minutes.   POSSIBLE D/C IN 1-2 DAYS, DEPENDING ON CLINICAL CONDITION.   Vaughan Basta M.D on 12/19/2015 at 8:04 AM  Between 7am to 6pm - Pager - [phone]  After 6pm go to [email or web address] - password [email or web address] [facility]  Office  [phone]  CC: Primary care physician; Wilhemena Durie, MD
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
## 2015-12-19 NOTE — Progress Notes (Signed)
[facility]  ROUNDING NOTE   Subjective:   Na improved to 124 - status post tolvaptan one dose yesterday. Another dose is scheduled for today  Family at bedside  To be transferred to [facility]  Objective:  Vital signs in last 24 hours:  Temp:  [97.5 F (36.4 C)-97.9 F (36.6 C)] 97.9 F (36.6 C) (01/07 1557) Pulse Rate:  [55-76] 61 (01/07 1557) Resp:  [18-22] 20 (01/07 1557) BP: (132-183)/(58-82) 161/69 mmHg (01/07 1557) SpO2:  [92 %-98 %] 98 % (01/07 1557)  Weight change:  Filed Weights   12/14/15 0938 12/14/15 1451  Weight: 48.988 kg (108 lb) 47.174 kg (104 lb)    Intake/Output: I/O last 3 completed shifts: In: 240 [P.O.:240] Out: 1050 [Urine:1050]   Intake/Output this shift:  Total I/O In: 360 [P.O.:360] Out: 450 [Urine:450]  Physical Exam: General: NAD, elderly white female  Head: Normocephalic, atraumatic. Moist oral mucosal membranes  Eyes: Right eye pupil round and reactive, left eye closed  Neck: Supple, trachea midline  Lungs:  Clear to auscultation  Heart: Regular rate and rhythm  Abdomen:  Soft, nontender,   Extremities: peripheral edema.  Neurologic: Nonfocal, moving all four extremities  Skin: No lesions       Basic Metabolic Panel:  Recent Labs Lab 12/14/15 1308 12/16/15 0554 12/17/15 0407 12/18/15 0555 12/18/15 1430 12/18/15 1601 12/19/15 0514  NA 128* 127* 127* 120* 119* 119* 124*  K 4.0 3.6 4.1 4.1  --   --  3.7  CL 94* 95* 96* 88*  --   --  88*  CO2 26 24 24 26   --   --  26  GLUCOSE 106* 122* 116* 108*  --   --  111*  BUN 18 12 22* 16  --   --  13  CREATININE 0.53 0.39* 0.33* <0.30*  --   --  0.45  CALCIUM 8.8* 8.3* 8.1* 8.1*  --   --  8.5*    Liver Function Tests:  Recent Labs Lab 12/14/15 1308  AST 26  ALT 16  ALKPHOS 63  BILITOT 0.8  PROT 6.6  ALBUMIN 3.6   No results for input(s): LIPASE, AMYLASE in the last 168 hours. No results for input(s): AMMONIA in the last 168 hours.  CBC:  Recent  Labs Lab 12/14/15 1308 12/17/15 0407 12/18/15 0555 12/19/15 0514  WBC 14.0* 9.9 10.2 11.4*  HGB 12.9 12.1 12.7 13.5  HCT 38.3 35.6 36.8 39.6  MCV 89.8 88.8 89.7 89.6  PLT 263 244 276 299    Cardiac Enzymes: No results for input(s): CKTOTAL, CKMB, CKMBINDEX, TROPONINI in the last 168 hours.  BNP: Invalid input(s): POCBNP  CBG: No results for input(s): GLUCAP in the last 168 hours.  Microbiology: Results for orders placed or performed during the hospital encounter of 12/01/15  Wound culture     Status: None   Collection Time: 12/01/15  4:05 PM  Result Value Ref Range Status   Specimen Description WOUND  Final   Special Requests NONE  Final   Culture NO GROWTH 4 DAYS  Final   Report Status 12/05/2015 FINAL  Final    Coagulation Studies: No results for input(s): LABPROT, INR in the last 72 hours.  Urinalysis: No results for input(s): COLORURINE, LABSPEC, PHURINE, GLUCOSEU, HGBUR, BILIRUBINUR, KETONESUR, PROTEINUR, UROBILINOGEN, NITRITE, LEUKOCYTESUR in the last 72 hours.  Invalid input(s): APPERANCEUR    Imaging: No results found.   Medications:     . amLODipine  5 mg Oral Daily  .  antiseptic oral rinse  7 mL Mouth Rinse q12n4p  . brimonidine  1 drop Left Eye BID  . docusate sodium  100 mg Oral BID  . enoxaparin (LOVENOX) injection  30 mg Subcutaneous Q24H  . feeding supplement (ENSURE ENLIVE)  237 mL Oral TID WC  . levobunolol  1 drop Right Eye BID  . levothyroxine  50 mcg Oral QAC breakfast  . metaxalone  400 mg Oral TID  . metoprolol tartrate  50 mg Oral Daily  . ofloxacin  1 drop Left Eye QID  . pantoprazole  40 mg Oral BID  . polyethylene glycol  17 g Oral Daily  . prednisoLONE acetate  1 drop Left Eye QID  . ramipril  5 mg Oral Daily  . sodium chloride  3 mL Intravenous Q12H  . tolvaptan  15 mg Oral Q24H  . vancomycin 50 mg/mL ophthalmic solution  1 drop Left Eye QID   sodium chloride, acetaminophen **OR** acetaminophen, bisacodyl, hydrALAZINE,  magnesium hydroxide, morphine injection, ondansetron **OR** ondansetron (ZOFRAN) IV, sodium chloride, traMADol  Assessment/ Plan:  Ms. [name] is a 82 y.o. white female with chronic left eye infection, hypertension, GERD, hypothyroidism, who was admitted to [facility] on 12/14/2015 for Weakness [R53.1] Rib fractures, right, closed, initial encounter [S22.31XA]   1. Hyponatremia: acute on chronic. Na 124. improving. Hypovolumic. Hypo-osmolar. Concern for a tea and toast diet like presentation.  - tolvaptan 15mg  daily until goal sodium above 125.  - serial sodium checks.   2. Hypertension: Hypotensive and with recent falls.  - resumed amlodipine, metorpolol and ramipril.    LOS: 3 [name] 1/7/20175:31 PM

## 2015-12-20 LAB — SODIUM: SODIUM: 127 mmol/L — AB (ref 135–145)

## 2015-12-20 MED ORDER — BRIMONIDINE TARTRATE 0.15 % OP SOLN: 1.0000 [drp] | Freq: Two times a day (BID) | OPHTHALMIC | Status: DC

## 2015-12-20 MED ORDER — OXYCODONE-ACETAMINOPHEN 5-325 MG PO TABS: 1.0000 | ORAL_TABLET | Freq: Three times a day (TID) | ORAL | Status: DC | PRN

## 2015-12-20 MED ORDER — AMLODIPINE BESYLATE 10 MG PO TABS
10.0000 mg | ORAL_TABLET | Freq: Every day | ORAL | Status: DC
  Administered 2015-12-20 – 2015-12-21 (×2): 10 mg via ORAL
  Filled 2015-12-20 (×2): qty 1

## 2015-12-20 MED ORDER — AMLODIPINE BESYLATE 10 MG PO TABS: 10.0000 mg | ORAL_TABLET | Freq: Every day | ORAL | Status: DC

## 2015-12-20 MED ORDER — TOLVAPTAN 15 MG PO TABS: 15.0000 mg | ORAL_TABLET | ORAL | Status: DC

## 2015-12-20 MED ORDER — OXYCODONE-ACETAMINOPHEN 5-325 MG PO TABS
1.0000 | ORAL_TABLET | Freq: Three times a day (TID) | ORAL | Status: DC | PRN
  Administered 2015-12-20 – 2015-12-21 (×3): 1 via ORAL
  Filled 2015-12-20 (×3): qty 1
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
## 2015-12-20 NOTE — Progress Notes (Signed)
Telephone call to [place] spoke with Claiborne Billings; advised her of previous telephone call with [facility] not transporting out of county except for emergency cases; Claiborne Billings to pass along and will try again tomorrow
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
## 2015-12-20 NOTE — Progress Notes (Signed)
Physical Therapy Treatment Patient Details Name: [name] MRN: [id number] DOB: 02/23/1914 Today's Date: 12/20/2015    History of Present Illness [name] is a 82 y.o. female with a known history of chronic infection of the left eye, hypertension, GERD, hypothyroidism comes to the emergency room after she had a mechanical fall accident which independent living. Patient says she tried to get up using her walker however ended up getting tangled with her walker and had a mechanical fall. She started having significant amount of right sided rib pain and in the emergency room was found to have several right sided rib fractures.    PT Comments    Pt shows considerable increase in safety, confidence and activity tolerance with ambulation today. She at times was able to take consistent, reciprocal steps and showed much more activity tolerance.  Her UEs did eventually give out quickly and she does need to sit (daughter following with chair).  Follow Up Recommendations  SNF     Equipment Recommendations       Recommendations for Other Services       Precautions / Restrictions Precautions Precautions: Fall Restrictions Weight Bearing Restrictions: No    Mobility  Bed Mobility Overal bed mobility: Needs Assistance Bed Mobility: Supine to Sit     Supine to sit: Min assist     General bed mobility comments: Pt does very well getting to EOB needing only very little assist to get legs off bed  Transfers Overall transfer level: Needs assistance Equipment used: Rolling walker (2 wheeled) Transfers: Sit to/from Stand Sit to Stand: Min guard         General transfer comment: Pt shows good effort with getting to standing and with minimal set up cues and safety reminders she does well and is able to rise w/o direct assist.  Ambulation/Gait Ambulation/Gait assistance: Min assist Ambulation Distance (Feet): 40 Feet Assistive device: Rolling walker (2 wheeled)       General  Gait Details: Pt shows surprisingly confident and increased speed ambulation.  She shows ability to at times keep the walker moving and her speed and step length are much better than previous attempts.    Stairs            Wheelchair Mobility    Modified Rankin (Stroke Patients Only)       Balance                                    Cognition Arousal/Alertness: Awake/alert Behavior During Therapy: WFL for tasks assessed/performed Overall Cognitive Status: Within Functional Limits for tasks assessed                      Exercises General Exercises - Lower Extremity Ankle Circles/Pumps: 10 reps;AROM Long Arc Quad: Strengthening;Both;Seated;10 reps Hip ABduction/ADduction: Strengthening;10 reps;Both Hip Flexion/Marching: Strengthening;Both;10 reps;Seated    General Comments        Pertinent Vitals/Pain Pain Score: 0-No pain    Home Living                      Prior Function            PT Goals (current goals can now be found in the care plan section) Progress towards PT goals: Progressing toward goals    Frequency  7X/week    PT Plan Current plan remains appropriate    Co-evaluation  End of Session Equipment Utilized During Treatment: Gait belt Activity Tolerance: Patient limited by fatigue (does not have nearly the drop-off with longer ambulation) Patient left: with bed alarm set;with nursing/sitter in room     Time: 1510-1537 PT Time Calculation (min) (ACUTE ONLY): 27 min  Charges:  $Gait Training: 8-22 mins $Therapeutic Exercise: 8-22 mins                    G Codes:     Wayne Both, PT, DPT [phone]  Kreg Shropshire 12/20/2015, 5:16 PM

## 2015-12-20 NOTE — Progress Notes (Signed)
Telephone call from [facility], spoke with Claiborne Billings; asked me to call [facility] to see if they will transfer this afternoon or evening and give her a call back at [phone] in order for her to look at a bed assignment
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
## 2015-12-20 NOTE — Consult Note (Signed)
  Addendum 12/20/15  Checked vision and it has now dropped to NLP. My note from earlier today was in error as the vision had not yet been checked.

## 2015-12-20 NOTE — Progress Notes (Signed)
[facility]  ROUNDING NOTE   Subjective:   Na improved to 127 - status post tolvaptan x 2 days  Family at bedside  To be transferred to [facility]  Objective:  Vital signs in last 24 hours:  Temp:  [97.9 F (36.6 C)-98.7 F (37.1 C)] 98.2 F (36.8 C) (01/08 0432) Pulse Rate:  [58-63] 63 (01/08 0432) Resp:  [18-20] 18 (01/08 0432) BP: (161-182)/(69-83) 182/83 mmHg (01/08 0432) SpO2:  [95 %-98 %] 95 % (01/08 0432)  Weight change:  Filed Weights   12/14/15 0938 12/14/15 1451  Weight: 48.988 kg (108 lb) 47.174 kg (104 lb)    Intake/Output: I/O last 3 completed shifts: In: 600 [P.O.:600] Out: 1500 [Urine:1500]   Intake/Output this shift:     Physical Exam: General: NAD, elderly white female  Head: Normocephalic, atraumatic. Moist oral mucosal membranes  Eyes: Right eye pupil round and reactive, left eye closed  Neck: Supple, trachea midline  Lungs:  Clear to auscultation  Heart: Regular rate and rhythm  Abdomen:  Soft, nontender,   Extremities: peripheral edema.  Neurologic: Nonfocal, moving all four extremities  Skin: No lesions       Basic Metabolic Panel:  Recent Labs Lab 12/14/15 1308 12/16/15 0554 12/17/15 0407 12/18/15 0555 12/18/15 1430 12/18/15 1601 12/19/15 0514 12/20/15 1244  NA 128* 127* 127* 120* 119* 119* 124* 127*  K 4.0 3.6 4.1 4.1  --   --  3.7  --   CL 94* 95* 96* 88*  --   --  88*  --   CO2 26 24 24 26   --   --  26  --   GLUCOSE 106* 122* 116* 108*  --   --  111*  --   BUN 18 12 22* 16  --   --  13  --   CREATININE 0.53 0.39* 0.33* <0.30*  --   --  0.45  --   CALCIUM 8.8* 8.3* 8.1* 8.1*  --   --  8.5*  --     Liver Function Tests:  Recent Labs Lab 12/14/15 1308  AST 26  ALT 16  ALKPHOS 63  BILITOT 0.8  PROT 6.6  ALBUMIN 3.6   No results for input(s): LIPASE, AMYLASE in the last 168 hours. No results for input(s): AMMONIA in the last 168 hours.  CBC:  Recent Labs Lab 12/14/15 1308 12/17/15 0407  12/18/15 0555 12/19/15 0514  WBC 14.0* 9.9 10.2 11.4*  HGB 12.9 12.1 12.7 13.5  HCT 38.3 35.6 36.8 39.6  MCV 89.8 88.8 89.7 89.6  PLT 263 244 276 299    Cardiac Enzymes: No results for input(s): CKTOTAL, CKMB, CKMBINDEX, TROPONINI in the last 168 hours.  BNP: Invalid input(s): POCBNP  CBG: No results for input(s): GLUCAP in the last 168 hours.  Microbiology: Results for orders placed or performed during the hospital encounter of 12/01/15  Wound culture     Status: None   Collection Time: 12/01/15  4:05 PM  Result Value Ref Range Status   Specimen Description WOUND  Final   Special Requests NONE  Final   Culture NO GROWTH 4 DAYS  Final   Report Status 12/05/2015 FINAL  Final    Coagulation Studies: No results for input(s): LABPROT, INR in the last 72 hours.  Urinalysis: No results for input(s): COLORURINE, LABSPEC, PHURINE, GLUCOSEU, HGBUR, BILIRUBINUR, KETONESUR, PROTEINUR, UROBILINOGEN, NITRITE, LEUKOCYTESUR in the last 72 hours.  Invalid input(s): APPERANCEUR    Imaging: No results found.   Medications:     .  amLODipine  10 mg Oral Daily  . antiseptic oral rinse  7 mL Mouth Rinse q12n4p  . brimonidine  1 drop Left Eye BID  . docusate sodium  100 mg Oral BID  . enoxaparin (LOVENOX) injection  30 mg Subcutaneous Q24H  . feeding supplement (ENSURE ENLIVE)  237 mL Oral TID WC  . levobunolol  1 drop Right Eye BID  . levothyroxine  50 mcg Oral QAC breakfast  . metaxalone  400 mg Oral TID  . metoprolol tartrate  50 mg Oral Daily  . ofloxacin  1 drop Left Eye QID  . pantoprazole  40 mg Oral BID  . polyethylene glycol  17 g Oral Daily  . prednisoLONE acetate  1 drop Left Eye QID  . ramipril  5 mg Oral Daily  . sodium chloride  3 mL Intravenous Q12H  . tolvaptan  15 mg Oral Q24H  . vancomycin 50 mg/mL ophthalmic solution  1 drop Left Eye QID   sodium chloride, acetaminophen **OR** acetaminophen, bisacodyl, hydrALAZINE, magnesium hydroxide, morphine injection,  ondansetron **OR** ondansetron (ZOFRAN) IV, oxyCODONE-acetaminophen, sodium chloride  Assessment/ Plan:  Ms. Charsie Steimel is a 82 y.o. white female with chronic left eye infection, hypertension, GERD, hypothyroidism, who was admitted to [facility] on 12/14/2015 for Weakness [R53.1] Rib fractures, right, closed, initial encounter [S22.31XA]   1. Hyponatremia: acute on chronic. Na 127. improving. Hypovolumic. Hypo-osmolar. Concern for a tea and toast diet like presentation.  - tolvaptan 15mg  for one more day.  - serial sodium checks.   2. Hypertension: Hypotensive and with recent falls.  - resumed amlodipine, metorpolol and ramipril.    LOS: Delta, Maridee Slape 1/8/20171:24 PM

## 2015-12-21 DIAGNOSIS — K219 Gastro-esophageal reflux disease without esophagitis: Secondary | ICD-10-CM | POA: Diagnosis not present

## 2015-12-21 DIAGNOSIS — S2249XA Multiple fractures of ribs, unspecified side, initial encounter for closed fracture: Secondary | ICD-10-CM | POA: Diagnosis not present

## 2015-12-21 DIAGNOSIS — M810 Age-related osteoporosis without current pathological fracture: Secondary | ICD-10-CM | POA: Diagnosis not present

## 2015-12-21 DIAGNOSIS — H4020X0 Unspecified primary angle-closure glaucoma, stage unspecified: Secondary | ICD-10-CM | POA: Diagnosis present

## 2015-12-21 DIAGNOSIS — Z9001 Acquired absence of eye: Secondary | ICD-10-CM | POA: Diagnosis not present

## 2015-12-21 DIAGNOSIS — K5909 Other constipation: Secondary | ICD-10-CM | POA: Diagnosis present

## 2015-12-21 DIAGNOSIS — Z66 Do not resuscitate: Secondary | ICD-10-CM | POA: Diagnosis present

## 2015-12-21 DIAGNOSIS — H16012 Central corneal ulcer, left eye: Secondary | ICD-10-CM | POA: Diagnosis not present

## 2015-12-21 DIAGNOSIS — E039 Hypothyroidism, unspecified: Secondary | ICD-10-CM | POA: Diagnosis not present

## 2015-12-21 DIAGNOSIS — I739 Peripheral vascular disease, unspecified: Secondary | ICD-10-CM | POA: Diagnosis present

## 2015-12-21 DIAGNOSIS — Z8711 Personal history of peptic ulcer disease: Secondary | ICD-10-CM | POA: Diagnosis not present

## 2015-12-21 DIAGNOSIS — Z8781 Personal history of (healed) traumatic fracture: Secondary | ICD-10-CM | POA: Insufficient documentation

## 2015-12-21 DIAGNOSIS — Z48810 Encounter for surgical aftercare following surgery on the sense organs: Secondary | ICD-10-CM | POA: Diagnosis not present

## 2015-12-21 DIAGNOSIS — B9561 Methicillin susceptible Staphylococcus aureus infection as the cause of diseases classified elsewhere: Secondary | ICD-10-CM | POA: Diagnosis not present

## 2015-12-21 DIAGNOSIS — Y33XXXA Other specified events, undetermined intent, initial encounter: Secondary | ICD-10-CM | POA: Diagnosis not present

## 2015-12-21 DIAGNOSIS — H44002 Unspecified purulent endophthalmitis, left eye: Secondary | ICD-10-CM | POA: Diagnosis not present

## 2015-12-21 DIAGNOSIS — E639 Nutritional deficiency, unspecified: Secondary | ICD-10-CM | POA: Diagnosis not present

## 2015-12-21 DIAGNOSIS — H109 Unspecified conjunctivitis: Secondary | ICD-10-CM | POA: Diagnosis present

## 2015-12-21 DIAGNOSIS — K269 Duodenal ulcer, unspecified as acute or chronic, without hemorrhage or perforation: Secondary | ICD-10-CM | POA: Diagnosis not present

## 2015-12-21 DIAGNOSIS — R197 Diarrhea, unspecified: Secondary | ICD-10-CM | POA: Diagnosis not present

## 2015-12-21 DIAGNOSIS — L03213 Periorbital cellulitis: Secondary | ICD-10-CM | POA: Diagnosis not present

## 2015-12-21 DIAGNOSIS — H16032 Corneal ulcer with hypopyon, left eye: Secondary | ICD-10-CM | POA: Diagnosis not present

## 2015-12-21 DIAGNOSIS — S0590XA Unspecified injury of unspecified eye and orbit, initial encounter: Secondary | ICD-10-CM | POA: Diagnosis not present

## 2015-12-21 DIAGNOSIS — H16072 Perforated corneal ulcer, left eye: Secondary | ICD-10-CM | POA: Diagnosis present

## 2015-12-21 DIAGNOSIS — H40052 Ocular hypertension, left eye: Secondary | ICD-10-CM | POA: Diagnosis present

## 2015-12-21 DIAGNOSIS — J90 Pleural effusion, not elsewhere classified: Secondary | ICD-10-CM | POA: Diagnosis not present

## 2015-12-21 DIAGNOSIS — S2241XA Multiple fractures of ribs, right side, initial encounter for closed fracture: Secondary | ICD-10-CM | POA: Diagnosis not present

## 2015-12-21 DIAGNOSIS — H16002 Unspecified corneal ulcer, left eye: Secondary | ICD-10-CM | POA: Diagnosis not present

## 2015-12-21 DIAGNOSIS — R531 Weakness: Secondary | ICD-10-CM | POA: Diagnosis not present

## 2015-12-21 DIAGNOSIS — R262 Difficulty in walking, not elsewhere classified: Secondary | ICD-10-CM | POA: Diagnosis not present

## 2015-12-21 DIAGNOSIS — H409 Unspecified glaucoma: Secondary | ICD-10-CM | POA: Diagnosis not present

## 2015-12-21 DIAGNOSIS — E222 Syndrome of inappropriate secretion of antidiuretic hormone: Secondary | ICD-10-CM | POA: Diagnosis not present

## 2015-12-21 DIAGNOSIS — I517 Cardiomegaly: Secondary | ICD-10-CM | POA: Diagnosis not present

## 2015-12-21 DIAGNOSIS — H02831 Dermatochalasis of right upper eyelid: Secondary | ICD-10-CM | POA: Diagnosis not present

## 2015-12-21 DIAGNOSIS — Z9181 History of falling: Secondary | ICD-10-CM | POA: Diagnosis not present

## 2015-12-21 DIAGNOSIS — M1990 Unspecified osteoarthritis, unspecified site: Secondary | ICD-10-CM | POA: Diagnosis present

## 2015-12-21 DIAGNOSIS — H5442 Blindness, left eye, normal vision right eye: Secondary | ICD-10-CM | POA: Diagnosis not present

## 2015-12-21 DIAGNOSIS — H02834 Dermatochalasis of left upper eyelid: Secondary | ICD-10-CM | POA: Diagnosis not present

## 2015-12-21 DIAGNOSIS — E871 Hypo-osmolality and hyponatremia: Secondary | ICD-10-CM | POA: Diagnosis not present

## 2015-12-21 DIAGNOSIS — M6281 Muscle weakness (generalized): Secondary | ICD-10-CM | POA: Diagnosis not present

## 2015-12-21 DIAGNOSIS — H539 Unspecified visual disturbance: Secondary | ICD-10-CM | POA: Diagnosis not present

## 2015-12-21 DIAGNOSIS — I10 Essential (primary) hypertension: Secondary | ICD-10-CM | POA: Diagnosis not present

## 2015-12-21 LAB — BASIC METABOLIC PANEL
ANION GAP: 7 (ref 5–15)
BUN: 16 mg/dL (ref 6–20)
CALCIUM: 8.6 mg/dL — AB (ref 8.9–10.3)
CO2: 30 mmol/L (ref 22–32)
CREATININE: 0.42 mg/dL — AB (ref 0.44–1.00)
Chloride: 95 mmol/L — ABNORMAL LOW (ref 101–111)
GLUCOSE: 110 mg/dL — AB (ref 65–99)
Potassium: 3.3 mmol/L — ABNORMAL LOW (ref 3.5–5.1)
Sodium: 132 mmol/L — ABNORMAL LOW (ref 135–145)

## 2015-12-21 MED FILL — Medication: Qty: 1 | Status: CN

## 2015-12-21 MED FILL — Medication: Qty: 1 | Status: AC

## 2015-12-21 NOTE — Clinical Social Work Note (Signed)
Pt will be transferred to [facility] when a bed is available. RNCM is following to facilitate transfer. CSW udpated [facility]. CSW also shared with pt's son that a discharge planner will be working with family to facilitate discharge to [facility] from [place] or [place]. CSW is signing off as no further needs identified.  Darden Dates, MSW, LCSW  Clinical Social Worker  [phone]

## 2015-12-21 NOTE — Progress Notes (Signed)
[facility]  ROUNDING NOTE   Subjective:   Na improved to 132  Transferring to  [facility] today Last dose of tolvaptan given 1/8  Objective:  Vital signs in last 24 hours:  Temp:  [97.4 F (36.3 C)-98 F (36.7 C)] 97.7 F (36.5 C) (01/09 1519) Pulse Rate:  [55-92] 55 (01/09 1519) Resp:  [18-22] 20 (01/09 1519) BP: (111-166)/(59-78) 111/59 mmHg (01/09 1519) SpO2:  [94 %-96 %] 96 % (01/09 1519)  Weight change:  Filed Weights   12/14/15 0938 12/14/15 1451  Weight: 48.988 kg (108 lb) 47.174 kg (104 lb)    Intake/Output: I/O last 3 completed shifts: In: 0  Out: 300 [Urine:300]   Intake/Output this shift:  Total I/O In: 360 [P.O.:360] Out: -   Physical Exam: General: NAD, elderly white female  Head: Normocephalic, atraumatic. Moist oral mucosal membranes  Eyes:  left eye closed  Neck: Supple, trachea midline  Lungs:  Clear to auscultation  Heart: Regular rate and rhythm  Abdomen:  Soft, nontender,   Extremities: peripheral edema.  Neurologic: Nonfocal, moving all four extremities  Skin: No lesions       Basic Metabolic Panel:  Recent Labs Lab 12/16/15 0554 12/17/15 0407 12/18/15 0555 12/18/15 1430 12/18/15 1601 12/19/15 0514 12/20/15 1244 12/21/15 0508  NA 127* 127* 120* 119* 119* 124* 127* 132*  K 3.6 4.1 4.1  --   --  3.7  --  3.3*  CL 95* 96* 88*  --   --  88*  --  95*  CO2 24 24 26   --   --  26  --  30  GLUCOSE 122* 116* 108*  --   --  111*  --  110*  BUN 12 22* 16  --   --  13  --  16  CREATININE 0.39* 0.33* <0.30*  --   --  0.45  --  0.42*  CALCIUM 8.3* 8.1* 8.1*  --   --  8.5*  --  8.6*    Liver Function Tests: No results for input(s): AST, ALT, ALKPHOS, BILITOT, PROT, ALBUMIN in the last 168 hours. No results for input(s): LIPASE, AMYLASE in the last 168 hours. No results for input(s): AMMONIA in the last 168 hours.  CBC:  Recent Labs Lab 12/17/15 0407 12/18/15 0555 12/19/15 0514  WBC 9.9 10.2 11.4*  HGB 12.1 12.7 13.5   HCT 35.6 36.8 39.6  MCV 88.8 89.7 89.6  PLT 244 276 299    Cardiac Enzymes: No results for input(s): CKTOTAL, CKMB, CKMBINDEX, TROPONINI in the last 168 hours.  BNP: Invalid input(s): POCBNP  CBG: No results for input(s): GLUCAP in the last 168 hours.  Microbiology: Results for orders placed or performed during the hospital encounter of 12/01/15  Wound culture     Status: None   Collection Time: 12/01/15  4:05 PM  Result Value Ref Range Status   Specimen Description WOUND  Final   Special Requests NONE  Final   Culture NO GROWTH 4 DAYS  Final   Report Status 12/05/2015 FINAL  Final    Coagulation Studies: No results for input(s): LABPROT, INR in the last 72 hours.  Urinalysis: No results for input(s): COLORURINE, LABSPEC, PHURINE, GLUCOSEU, HGBUR, BILIRUBINUR, KETONESUR, PROTEINUR, UROBILINOGEN, NITRITE, LEUKOCYTESUR in the last 72 hours.  Invalid input(s): APPERANCEUR    Imaging: No results found.   Medications:     . amLODipine  10 mg Oral Daily  . antiseptic oral rinse  7 mL Mouth Rinse q12n4p  .  brimonidine  1 drop Left Eye BID  . docusate sodium  100 mg Oral BID  . enoxaparin (LOVENOX) injection  30 mg Subcutaneous Q24H  . feeding supplement (ENSURE ENLIVE)  237 mL Oral TID WC  . levobunolol  1 drop Right Eye BID  . levothyroxine  50 mcg Oral QAC breakfast  . metaxalone  400 mg Oral TID  . metoprolol tartrate  50 mg Oral Daily  . ofloxacin  1 drop Left Eye QID  . pantoprazole  40 mg Oral BID  . polyethylene glycol  17 g Oral Daily  . prednisoLONE acetate  1 drop Left Eye QID  . ramipril  5 mg Oral Daily  . sodium chloride  3 mL Intravenous Q12H  . tolvaptan  15 mg Oral Q24H  . vancomycin 50 mg/mL ophthalmic solution  1 drop Left Eye QID   sodium chloride, acetaminophen **OR** acetaminophen, bisacodyl, hydrALAZINE, magnesium hydroxide, morphine injection, ondansetron **OR** ondansetron (ZOFRAN) IV, oxyCODONE-acetaminophen, sodium  chloride  Assessment/ Plan:  Ms. Blue Dalby is a 82 y.o. white female with chronic left eye infection, hypertension, GERD, hypothyroidism, who was admitted to [facility] on 12/14/2015 for Weakness [R53.1] Rib fractures, right, closed, initial encounter [S22.31XA]   1. Hyponatremia: acute on chronic. Na 132. improving. Hypovolumic. Hypo-osmolar.  - improved with tovaptan  - Urine Osm 508 on 12/16/15    LOS: 5 Adreanna Fickel 1/9/20174:34 PM

## 2015-12-21 NOTE — Progress Notes (Signed)
[place] [facility] [place] NAME: [name]    MR#:  [id number]  DATE OF BIRTH:  09/02/1914  SUBJECTIVE:  CHIEF COMPLAINT:   Chief Complaint  Patient presents with  . Fall  . Back Pain   -Admitted after a fall and right-sided rib pain. Noted to have right-sided seventh eighth and ninth rib fractures. C/o headache, and pain all over the body- little better today. Waiting for bed at [facility].  REVIEW OF SYSTEMS:  Review of Systems  Constitutional: Negative for fever and chills.  HENT: Negative for ear discharge, ear pain and nosebleeds.   Eyes: Negative for blurred vision.  Respiratory: Negative for cough, shortness of breath and wheezing.   Cardiovascular: Negative for chest pain and palpitations.  Gastrointestinal: Negative for nausea, vomiting, abdominal pain, diarrhea and constipation.  Genitourinary: Negative for dysuria and frequency.  Musculoskeletal: Positive for myalgias and back pain.  Neurological: Negative for dizziness, tremors, seizures, weakness and headaches.  Psychiatric/Behavioral: Negative for depression.    DRUG ALLERGIES:  No Known Allergies  VITALS:  Blood pressure 111/59, pulse 55, temperature 97.7 F (36.5 C), temperature source Oral, resp. rate 20, height 5\' 2"  (1.575 m), weight 47.174 kg (104 lb), SpO2 96 %.  PHYSICAL EXAMINATION:  Physical Exam  GENERAL:  82 y.o.-year-old patient lying in the bed with some acute distress with pain.  EYES: Conjunctival injection on the left, she keeps the left eye closed, right pupil reactive conjunctiva clear HEENT: Head atraumatic, normocephalic. Oropharynx and nasopharynx clear.  NECK:  Supple, no jugular venous distention. No thyroid enlargement, no tenderness.  LUNGS: Normal breath sounds bilaterally, no wheezing, rales,rhonchi or crepitation. No use of accessory muscles of respiration. Decreased bibasilar breath sounds. CARDIOVASCULAR: S1, S2 normal. Norubs, or  gallops. 3/6 systolic murmur present ABDOMEN: Soft, nontender, nondistended. Bowel sounds present. No organomegaly or mass.  EXTREMITIES: No pedal edema, cyanosis, or clubbing. Pulses 2+. NEUROLOGIC: Cranial nerves II through XII are intact. Muscle strength 5/5 in all extremities. Sensation intact. Gait not checked.  PSYCHIATRIC: The patient is alert and oriented x 3.  SKIN: No obvious rash, lesion, or ulcer.    LABORATORY PANEL:   CBC  Recent Labs Lab 12/19/15 0514  WBC 11.4*  HGB 13.5  HCT 39.6  PLT 299   ------------------------------------------------------------------------------------------------------------------  Chemistries   Recent Labs Lab 12/21/15 0508  NA 132*  K 3.3*  CL 95*  CO2 30  GLUCOSE 110*  BUN 16  CREATININE 0.42*  CALCIUM 8.6*   ------------------------------------------------------------------------------------------------------------------  Cardiac Enzymes No results for input(s): TROPONINI in the last 168 hours. ------------------------------------------------------------------------------------------------------------------  RADIOLOGY:  No results found.  EKG:   Orders placed or performed in visit on 09/13/14  . EKG 12-Lead    ASSESSMENT AND PLAN:   [name] is a 82 y.o. 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  1. Acute right seventh and eighth and ninth rib fracture status post mechanical fall. -Continue pain medications as needed.  -Physical therapy recommendation is for SNF- which will be done from [place] after care of his eye. - Incentive spirometer -Social worker for discharge planning. - as c/o uncontrolled pain- added percocet.   2. Accelerated hypertension:  - Continue metoprolol, Altace - Continue pain control - as BP is running higher  today- increased dose of amlodipin.  3. GERD Continue PPI  4. Corneal perforation left eye: Her ophthalmologist Dr. Jeni Salles has seen her today in the ED ophthalmology suite and determined that her corneal laceration is now a corneal perforation. He has recommended urgent transfer to a tertiary care center.  spoken with both [facility] and [facility] who accepted her and have her on waiting list. She is on the waiting list at both institutions for a medical bed with ophthalmology consultation. Both ophthalmology services have been notified.  Ophthalmology is following daily. Today a bed is available at [facility] for her- so will transfer her.  5. Hyponatremia- - appreciate nephrology consultation. IV fluids . She is not taking in much by mouth. - improved. - on tolvaptan by nephrology. - she will need to check sodium level frequently by PMD after discharge.  6. DVT prophylaxis heparin on Lovenox   All the records are reviewed and case discussed with Care Management/Social Workerr. Management plans discussed with the patient, family and they are in agreement.  CODE STATUS: DO NOT RESUSCITATE  TOTAL TIME TAKING CARE OF THIS PATIENT: 35 minutes.     Vaughan Basta M.D on 12/21/2015 at 4:09 PM  Between 7am to 6pm - Pager - [phone]  After 6pm go to [email or web address] - password [email or web address] [facility]  Office  [phone]  CC: Primary care physician; Wilhemena Durie, MD

## 2015-12-21 NOTE — Plan of Care (Addendum)
VSS, afebrile. Pt awaiting transportation via [facility] to be transferred to [facility] for eye surgery.   [facility]: [place]: [phone]  Problem: Safety: Goal: Ability to remain free from injury will improve Outcome: Progressing High fall risk. Bed alarm on, hourly rounding. Safe environment provided. Pt understands how to use call system for assistance.   Problem: Pain Managment: Goal: General experience of comfort will improve Outcome: Progressing C/o headache pain relieved by PRN Percocet x1 and Tylenol x1  Problem: Skin Integrity: Goal: Risk for impaired skin integrity will decrease Outcome: Progressing Bedpan provided for toileting, skin care provided. Foam on sacrum for protection. Pt able to turn independently in bed.   Problem: Activity: Goal: Risk for activity intolerance will decrease Outcome: Progressing Educated pt importance on rest periods

## 2015-12-21 NOTE — Care Management Important Message (Signed)
Important Message  Patient Details  Name: [name] MRN: [id number] Date of Birth: 20-Nov-1914   Medicare Important Message Given:  Yes    Juliann Pulse A Punam Broussard 12/21/2015, 9:10 AM

## 2015-12-21 NOTE — Progress Notes (Signed)
Patient to be transferred to [place] report to Ria Comment, RN for room [phone] at [facility]. EMS has been called for transport.

## 2015-12-21 NOTE — Consult Note (Signed)
  Ophthalmology  [facility]  LP T (tonopen) 45 SLE: small mucoid plug central on cornea; seidel negative  Patient is being transferred to [facility] this evening.

## 2015-12-21 NOTE — Progress Notes (Signed)
Physical Therapy Treatment Patient Details Name: [name] MRN: [id number] DOB: [birth year] Today's Date: 12/21/2015    History of Present Illness [name] is a 82 y.o. female with a known history of chronic infection of the left eye, hypertension, GERD, hypothyroidism comes to the emergency room after she had a mechanical fall accident which independent living. Patient says she tried to get up using her walker however ended up getting tangled with her walker and had a mechanical fall. She started having significant amount of right sided rib pain and in the emergency room was found to have several right sided rib fractures.    PT Comments    Pt is making good progress with goals, however only ambulating to recliner this date as she complains of headache. RN aware. Only requires 1 assist at this time. Good endurance with hep and all questions answered. Pt remains motivated to perform therapy and hopes to be transferred to a tertiary care center for continued medical care.  Follow Up Recommendations  SNF     Equipment Recommendations       Recommendations for Other Services       Precautions / Restrictions Precautions Precautions: Fall Restrictions Weight Bearing Restrictions: No    Mobility  Bed Mobility Overal bed mobility: Needs Assistance Bed Mobility: Supine to Sit     Supine to sit: Min assist     General bed mobility comments: Pt is able to slide to EOB and needs assist for trunk support. Once seated at EOB, pt able to sit with cga  Transfers Overall transfer level: Needs assistance Equipment used: Rolling walker (2 wheeled) Transfers: Sit to/from Stand Sit to Stand: Mod assist         General transfer comment: Pt requires increased assist this date to get gluts under her. Cues given for correct hand technique and then needs cues to move hands to RW once standing. Post leaning noted once in standing, requiring min assist for  correction.  Ambulation/Gait Ambulation/Gait assistance: Mod assist Ambulation Distance (Feet): 3 Feet Assistive device: Rolling walker (2 wheeled) Gait Pattern/deviations: Step-to pattern     General Gait Details: ambulates to recliner with hesitant nature as she is fearful of falling. Pt needs tactile cues for weight shifting and coordination of rw as it tends to go too far out infront of body. Pt fatigues with short distance this session   Stairs            Wheelchair Mobility    Modified Rankin (Stroke Patients Only)       Balance                                    Cognition Arousal/Alertness: Awake/alert Behavior During Therapy: WFL for tasks assessed/performed Overall Cognitive Status: Within Functional Limits for tasks assessed                      Exercises Other Exercises Other Exercises: supine/seated ther-ex performed and written HEP handout given and reviewed. Pt performed 15 reps on B LE including ankle pumps, quad sets, glut sets, SLRs, hip ab/add, hip add squeezes, and SAQ, All ther-ex performed with superivison    General Comments        Pertinent Vitals/Pain Pain Assessment: 0-10 Pain Score: 7  Pain Location: headache Pain Descriptors / Indicators: Pressure Pain Intervention(s): Limited activity within patient's tolerance    Home Living  Prior Function            PT Goals (current goals can now be found in the care plan section) Acute Rehab PT Goals Patient Stated Goal: to walk better PT Goal Formulation: With patient/family Potential to Achieve Goals: Good Progress towards PT goals: Progressing toward goals    Frequency  7X/week    PT Plan Current plan remains appropriate    Co-evaluation             End of Session   Activity Tolerance: Patient limited by fatigue Patient left: in chair;with chair alarm set     Time: [id number] PT Time Calculation (min) (ACUTE ONLY):  23 min  Charges:  $Gait Training: 8-22 mins $Therapeutic Exercise: 8-22 mins                    G Codes:      Lyndle Pang 2016/01/15, 11:34 AM Greggory Stallion, PT, DPT [phone]

## 2015-12-21 NOTE — Care Management (Signed)
Waiting on call back from [facility] for bed availability. Telephone call to [place]. Informed that [facility] is not transporting to other outlier facilities. Telephone call to [facility]. Will transport today to other facilities.  Transfer form updated. Dr. Anselm Jungling will update discharge summary. Family members updated. Shelbie Ammons RN MSN CCM Care Management [phone]

## 2015-12-21 NOTE — Consult Note (Signed)
  Saw [name] this morning. Vision is definite light perception, not NLP as I thought yesterday afternoon. Eye is firm, but not hard. I reviewed the CT scan from 1/4 and noted the R sinus opacification. Globes were OK, except IOL was pushed forward in left eye.   Impression: my belief is that the ulcer perforated and then sealed. I have not seen it seidel postiive at ANY examination. Glue at this point would be of no use, in my view. Ultrasound would be useful to see if there is any other problem in the globe itself. [place] on 12/30 at the office was negative for an intraocular problem in the posterior chamber, but AC was deep at that with hypopyon.   Have been awaiting transfer to [place] since Friday. The snowstorm made transfer impossible. Her critically low sodium has improved some but is still low. Her ribs are still causing her pain. Hopefully transport will be available today.

## 2015-12-25 DIAGNOSIS — H16002 Unspecified corneal ulcer, left eye: Secondary | ICD-10-CM | POA: Diagnosis not present

## 2015-12-25 DIAGNOSIS — H44002 Unspecified purulent endophthalmitis, left eye: Secondary | ICD-10-CM | POA: Diagnosis not present

## 2015-12-26 DIAGNOSIS — H16019 Central corneal ulcer, unspecified eye: Secondary | ICD-10-CM | POA: Insufficient documentation

## 2015-12-30 DIAGNOSIS — K269 Duodenal ulcer, unspecified as acute or chronic, without hemorrhage or perforation: Secondary | ICD-10-CM | POA: Diagnosis not present

## 2015-12-30 DIAGNOSIS — E871 Hypo-osmolality and hyponatremia: Secondary | ICD-10-CM | POA: Diagnosis not present

## 2015-12-30 DIAGNOSIS — Z9001 Acquired absence of eye: Secondary | ICD-10-CM | POA: Diagnosis not present

## 2015-12-30 DIAGNOSIS — R531 Weakness: Secondary | ICD-10-CM | POA: Diagnosis not present

## 2015-12-30 DIAGNOSIS — E639 Nutritional deficiency, unspecified: Secondary | ICD-10-CM | POA: Diagnosis not present

## 2015-12-30 DIAGNOSIS — G933 Postviral fatigue syndrome: Secondary | ICD-10-CM | POA: Diagnosis not present

## 2015-12-30 DIAGNOSIS — S2239XA Fracture of one rib, unspecified side, initial encounter for closed fracture: Secondary | ICD-10-CM | POA: Diagnosis not present

## 2015-12-30 DIAGNOSIS — B9561 Methicillin susceptible Staphylococcus aureus infection as the cause of diseases classified elsewhere: Secondary | ICD-10-CM | POA: Diagnosis not present

## 2015-12-30 DIAGNOSIS — E039 Hypothyroidism, unspecified: Secondary | ICD-10-CM | POA: Diagnosis not present

## 2015-12-30 DIAGNOSIS — H409 Unspecified glaucoma: Secondary | ICD-10-CM | POA: Diagnosis not present

## 2015-12-30 DIAGNOSIS — K219 Gastro-esophageal reflux disease without esophagitis: Secondary | ICD-10-CM | POA: Diagnosis not present

## 2015-12-30 DIAGNOSIS — M810 Age-related osteoporosis without current pathological fracture: Secondary | ICD-10-CM | POA: Diagnosis not present

## 2015-12-30 DIAGNOSIS — H44002 Unspecified purulent endophthalmitis, left eye: Secondary | ICD-10-CM | POA: Diagnosis not present

## 2015-12-30 DIAGNOSIS — I10 Essential (primary) hypertension: Secondary | ICD-10-CM | POA: Diagnosis not present

## 2015-12-30 DIAGNOSIS — H16012 Central corneal ulcer, left eye: Secondary | ICD-10-CM | POA: Diagnosis not present

## 2015-12-30 DIAGNOSIS — Z9181 History of falling: Secondary | ICD-10-CM | POA: Diagnosis not present

## 2015-12-30 DIAGNOSIS — Z48810 Encounter for surgical aftercare following surgery on the sense organs: Secondary | ICD-10-CM | POA: Diagnosis not present

## 2015-12-30 DIAGNOSIS — M6281 Muscle weakness (generalized): Secondary | ICD-10-CM | POA: Diagnosis not present

## 2015-12-31 DIAGNOSIS — G933 Postviral fatigue syndrome: Secondary | ICD-10-CM | POA: Diagnosis not present

## 2015-12-31 DIAGNOSIS — I10 Essential (primary) hypertension: Secondary | ICD-10-CM | POA: Diagnosis not present

## 2015-12-31 DIAGNOSIS — E871 Hypo-osmolality and hyponatremia: Secondary | ICD-10-CM | POA: Diagnosis not present

## 2015-12-31 DIAGNOSIS — S2239XA Fracture of one rib, unspecified side, initial encounter for closed fracture: Secondary | ICD-10-CM | POA: Diagnosis not present

## 2016-01-04 ENCOUNTER — Other Ambulatory Visit
Admission: RE | Admit: 2016-01-04 | Discharge: 2016-01-04 | Disposition: A | Discharge status: Home / Self Care | Payer: Medicare Other | Source: Ambulatory Visit | Attending: Internal Medicine | Admitting: Internal Medicine

## 2016-01-04 DIAGNOSIS — E871 Hypo-osmolality and hyponatremia: Secondary | ICD-10-CM | POA: Insufficient documentation

## 2016-01-04 LAB — BASIC METABOLIC PANEL
Anion gap: 8 (ref 5–15)
BUN: 27 mg/dL — AB (ref 6–20)
CALCIUM: 8.8 mg/dL — AB (ref 8.9–10.3)
CO2: 24 mmol/L (ref 22–32)
CREATININE: 0.5 mg/dL (ref 0.44–1.00)
Chloride: 104 mmol/L (ref 101–111)
GFR calc non Af Amer: >60 mL/min (ref >60)
Glucose, Bld: 97 mg/dL (ref 65–99)
Potassium: 3.9 mmol/L (ref 3.5–5.1)
Sodium: 136 mmol/L (ref 135–145)

## 2016-01-13 ENCOUNTER — Encounter
Admission: RE | Admit: 2016-01-13 | Discharge: 2016-01-13 | Disposition: A | Discharge status: Home / Self Care | Payer: Medicare Other | Source: Ambulatory Visit | Attending: Internal Medicine | Admitting: Internal Medicine

## 2016-01-13 DIAGNOSIS — R410 Disorientation, unspecified: Secondary | ICD-10-CM | POA: Insufficient documentation

## 2016-01-30 LAB — URINALYSIS COMPLETE WITH MICROSCOPIC (ARMC ONLY)
BACTERIA UA: NONE SEEN
BILIRUBIN URINE: NEGATIVE
GLUCOSE, UA: NEGATIVE mg/dL
HGB URINE DIPSTICK: NEGATIVE
Ketones, ur: NEGATIVE mg/dL
LEUKOCYTES UA: NEGATIVE
NITRITE: NEGATIVE
PH: 6 (ref 5.0–8.0)
Protein, ur: NEGATIVE mg/dL
SPECIFIC GRAVITY, URINE: 1.023 (ref 1.005–1.030)

## 2016-02-01 LAB — URINE CULTURE

## 2016-02-02 LAB — URINALYSIS COMPLETE WITH MICROSCOPIC (ARMC ONLY)
BACTERIA UA: NONE SEEN
BILIRUBIN URINE: NEGATIVE
GLUCOSE, UA: NEGATIVE mg/dL
HGB URINE DIPSTICK: NEGATIVE
Ketones, ur: NEGATIVE mg/dL
Leukocytes, UA: NEGATIVE
NITRITE: NEGATIVE
Protein, ur: NEGATIVE mg/dL
RBC / HPF: NONE SEEN RBC/hpf (ref 0–5)
Specific Gravity, Urine: 1.018 (ref 1.005–1.030)
Squamous Epithelial / LPF: NONE SEEN
pH: 8 (ref 5.0–8.0)

## 2016-02-04 LAB — URINE CULTURE

## 2016-02-10 DIAGNOSIS — M810 Age-related osteoporosis without current pathological fracture: Secondary | ICD-10-CM | POA: Diagnosis not present

## 2016-02-10 DIAGNOSIS — Z9181 History of falling: Secondary | ICD-10-CM | POA: Diagnosis not present

## 2016-02-10 DIAGNOSIS — I10 Essential (primary) hypertension: Secondary | ICD-10-CM | POA: Diagnosis not present

## 2016-02-10 DIAGNOSIS — M150 Primary generalized (osteo)arthritis: Secondary | ICD-10-CM | POA: Diagnosis not present

## 2016-02-10 DIAGNOSIS — H409 Unspecified glaucoma: Secondary | ICD-10-CM | POA: Diagnosis not present

## 2016-02-10 DIAGNOSIS — Z48810 Encounter for surgical aftercare following surgery on the sense organs: Secondary | ICD-10-CM | POA: Diagnosis not present

## 2016-02-17 ENCOUNTER — Telehealth: Payer: Self-pay

## 2016-02-17 ENCOUNTER — Telehealth: Payer: Self-pay | Admitting: Physician Assistant

## 2016-02-17 DIAGNOSIS — M150 Primary generalized (osteo)arthritis: Secondary | ICD-10-CM | POA: Diagnosis not present

## 2016-02-17 DIAGNOSIS — Z48810 Encounter for surgical aftercare following surgery on the sense organs: Secondary | ICD-10-CM | POA: Diagnosis not present

## 2016-02-17 DIAGNOSIS — Z9181 History of falling: Secondary | ICD-10-CM | POA: Diagnosis not present

## 2016-02-17 DIAGNOSIS — H409 Unspecified glaucoma: Secondary | ICD-10-CM | POA: Diagnosis not present

## 2016-02-17 DIAGNOSIS — I10 Essential (primary) hypertension: Secondary | ICD-10-CM | POA: Diagnosis not present

## 2016-02-17 DIAGNOSIS — M810 Age-related osteoporosis without current pathological fracture: Secondary | ICD-10-CM | POA: Diagnosis not present

## 2016-02-17 NOTE — Telephone Encounter (Signed)
Esther from [employer] called pt today and pt refused OT Eval. Sherlynn Stalls advised that pt told her she already has someone and she doesn't need her. Sherlynn Stalls just wanted to update [place]. Thanks TNP

## 2016-02-17 NOTE — Telephone Encounter (Signed)
Yes she may use compression stockings for lower extremity edema

## 2016-02-17 NOTE — Telephone Encounter (Signed)
Dawn from [employer] called and states that patient is having severe swelling in bilateral lower extreme ties and some are is whelping, they wanted to get a verbal to get compression stockings to help. She saw Dr .Rosanna Randy in 2015 last and Simona Huh 02/2015. Dawn states her orders states Dr Rosanna Randy is following but i dont see any orders scanned in her chart with his information im not sure how he is still following without have seen her in so long. Please review-aa

## 2016-02-18 NOTE — Telephone Encounter (Signed)
FYI

## 2016-02-18 NOTE — Telephone Encounter (Signed)
Ok thanks 

## 2016-02-19 DIAGNOSIS — I10 Essential (primary) hypertension: Secondary | ICD-10-CM | POA: Diagnosis not present

## 2016-02-19 DIAGNOSIS — M150 Primary generalized (osteo)arthritis: Secondary | ICD-10-CM | POA: Diagnosis not present

## 2016-02-19 DIAGNOSIS — M810 Age-related osteoporosis without current pathological fracture: Secondary | ICD-10-CM | POA: Diagnosis not present

## 2016-02-19 DIAGNOSIS — H409 Unspecified glaucoma: Secondary | ICD-10-CM | POA: Diagnosis not present

## 2016-02-19 DIAGNOSIS — Z9181 History of falling: Secondary | ICD-10-CM | POA: Diagnosis not present

## 2016-02-19 DIAGNOSIS — Z48810 Encounter for surgical aftercare following surgery on the sense organs: Secondary | ICD-10-CM | POA: Diagnosis not present

## 2016-02-19 NOTE — Telephone Encounter (Signed)
[name]

## 2016-02-22 ENCOUNTER — Encounter: Payer: Medicare Other | Admitting: Family Medicine

## 2016-02-22 DIAGNOSIS — Z9181 History of falling: Secondary | ICD-10-CM | POA: Diagnosis not present

## 2016-02-22 DIAGNOSIS — M150 Primary generalized (osteo)arthritis: Secondary | ICD-10-CM | POA: Diagnosis not present

## 2016-02-22 DIAGNOSIS — Z48810 Encounter for surgical aftercare following surgery on the sense organs: Secondary | ICD-10-CM | POA: Diagnosis not present

## 2016-02-22 DIAGNOSIS — I10 Essential (primary) hypertension: Secondary | ICD-10-CM | POA: Diagnosis not present

## 2016-02-22 DIAGNOSIS — M810 Age-related osteoporosis without current pathological fracture: Secondary | ICD-10-CM | POA: Diagnosis not present

## 2016-02-22 DIAGNOSIS — H409 Unspecified glaucoma: Secondary | ICD-10-CM | POA: Diagnosis not present

## 2016-02-25 DIAGNOSIS — M150 Primary generalized (osteo)arthritis: Secondary | ICD-10-CM | POA: Diagnosis not present

## 2016-02-25 DIAGNOSIS — Z9181 History of falling: Secondary | ICD-10-CM | POA: Diagnosis not present

## 2016-02-25 DIAGNOSIS — H409 Unspecified glaucoma: Secondary | ICD-10-CM | POA: Diagnosis not present

## 2016-02-25 DIAGNOSIS — Z48810 Encounter for surgical aftercare following surgery on the sense organs: Secondary | ICD-10-CM | POA: Diagnosis not present

## 2016-02-25 DIAGNOSIS — M810 Age-related osteoporosis without current pathological fracture: Secondary | ICD-10-CM | POA: Diagnosis not present

## 2016-02-25 DIAGNOSIS — I10 Essential (primary) hypertension: Secondary | ICD-10-CM | POA: Diagnosis not present

## 2016-02-26 DIAGNOSIS — M810 Age-related osteoporosis without current pathological fracture: Secondary | ICD-10-CM | POA: Diagnosis not present

## 2016-02-26 DIAGNOSIS — Z48810 Encounter for surgical aftercare following surgery on the sense organs: Secondary | ICD-10-CM | POA: Diagnosis not present

## 2016-02-26 DIAGNOSIS — H409 Unspecified glaucoma: Secondary | ICD-10-CM | POA: Diagnosis not present

## 2016-02-26 DIAGNOSIS — M150 Primary generalized (osteo)arthritis: Secondary | ICD-10-CM | POA: Diagnosis not present

## 2016-02-26 DIAGNOSIS — I10 Essential (primary) hypertension: Secondary | ICD-10-CM | POA: Diagnosis not present

## 2016-02-26 DIAGNOSIS — Z9181 History of falling: Secondary | ICD-10-CM | POA: Diagnosis not present

## 2016-02-29 DIAGNOSIS — M150 Primary generalized (osteo)arthritis: Secondary | ICD-10-CM | POA: Diagnosis not present

## 2016-02-29 DIAGNOSIS — I10 Essential (primary) hypertension: Secondary | ICD-10-CM | POA: Diagnosis not present

## 2016-02-29 DIAGNOSIS — M810 Age-related osteoporosis without current pathological fracture: Secondary | ICD-10-CM | POA: Diagnosis not present

## 2016-02-29 DIAGNOSIS — Z48810 Encounter for surgical aftercare following surgery on the sense organs: Secondary | ICD-10-CM | POA: Diagnosis not present

## 2016-02-29 DIAGNOSIS — H409 Unspecified glaucoma: Secondary | ICD-10-CM | POA: Diagnosis not present

## 2016-02-29 DIAGNOSIS — Z9181 History of falling: Secondary | ICD-10-CM | POA: Diagnosis not present

## 2016-03-02 DIAGNOSIS — Z9889 Other specified postprocedural states: Secondary | ICD-10-CM | POA: Insufficient documentation

## 2016-03-02 DIAGNOSIS — H10432 Chronic follicular conjunctivitis, left eye: Secondary | ICD-10-CM | POA: Diagnosis not present

## 2016-03-02 DIAGNOSIS — Z9001 Acquired absence of eye: Secondary | ICD-10-CM | POA: Diagnosis not present

## 2016-03-02 DIAGNOSIS — H10439 Chronic follicular conjunctivitis, unspecified eye: Secondary | ICD-10-CM | POA: Insufficient documentation

## 2016-03-03 DIAGNOSIS — Z48810 Encounter for surgical aftercare following surgery on the sense organs: Secondary | ICD-10-CM | POA: Diagnosis not present

## 2016-03-03 DIAGNOSIS — M810 Age-related osteoporosis without current pathological fracture: Secondary | ICD-10-CM | POA: Diagnosis not present

## 2016-03-03 DIAGNOSIS — Z9181 History of falling: Secondary | ICD-10-CM | POA: Diagnosis not present

## 2016-03-03 DIAGNOSIS — M150 Primary generalized (osteo)arthritis: Secondary | ICD-10-CM | POA: Diagnosis not present

## 2016-03-03 DIAGNOSIS — H409 Unspecified glaucoma: Secondary | ICD-10-CM | POA: Diagnosis not present

## 2016-03-03 DIAGNOSIS — I10 Essential (primary) hypertension: Secondary | ICD-10-CM | POA: Diagnosis not present

## 2016-03-04 DIAGNOSIS — M810 Age-related osteoporosis without current pathological fracture: Secondary | ICD-10-CM | POA: Diagnosis not present

## 2016-03-04 DIAGNOSIS — Z48810 Encounter for surgical aftercare following surgery on the sense organs: Secondary | ICD-10-CM | POA: Diagnosis not present

## 2016-03-04 DIAGNOSIS — Z9181 History of falling: Secondary | ICD-10-CM | POA: Diagnosis not present

## 2016-03-04 DIAGNOSIS — M150 Primary generalized (osteo)arthritis: Secondary | ICD-10-CM | POA: Diagnosis not present

## 2016-03-04 DIAGNOSIS — I10 Essential (primary) hypertension: Secondary | ICD-10-CM | POA: Diagnosis not present

## 2016-03-04 DIAGNOSIS — H409 Unspecified glaucoma: Secondary | ICD-10-CM | POA: Diagnosis not present

## 2016-03-07 DIAGNOSIS — M150 Primary generalized (osteo)arthritis: Secondary | ICD-10-CM | POA: Diagnosis not present

## 2016-03-07 DIAGNOSIS — Z48810 Encounter for surgical aftercare following surgery on the sense organs: Secondary | ICD-10-CM | POA: Diagnosis not present

## 2016-03-07 DIAGNOSIS — M810 Age-related osteoporosis without current pathological fracture: Secondary | ICD-10-CM | POA: Diagnosis not present

## 2016-03-07 DIAGNOSIS — H409 Unspecified glaucoma: Secondary | ICD-10-CM | POA: Diagnosis not present

## 2016-03-07 DIAGNOSIS — I10 Essential (primary) hypertension: Secondary | ICD-10-CM | POA: Diagnosis not present

## 2016-03-07 DIAGNOSIS — Z9181 History of falling: Secondary | ICD-10-CM | POA: Diagnosis not present

## 2016-03-09 DIAGNOSIS — Z9181 History of falling: Secondary | ICD-10-CM | POA: Diagnosis not present

## 2016-03-09 DIAGNOSIS — M150 Primary generalized (osteo)arthritis: Secondary | ICD-10-CM | POA: Diagnosis not present

## 2016-03-09 DIAGNOSIS — H409 Unspecified glaucoma: Secondary | ICD-10-CM | POA: Diagnosis not present

## 2016-03-09 DIAGNOSIS — M810 Age-related osteoporosis without current pathological fracture: Secondary | ICD-10-CM | POA: Diagnosis not present

## 2016-03-09 DIAGNOSIS — Z48810 Encounter for surgical aftercare following surgery on the sense organs: Secondary | ICD-10-CM | POA: Diagnosis not present

## 2016-03-09 DIAGNOSIS — I10 Essential (primary) hypertension: Secondary | ICD-10-CM | POA: Diagnosis not present

## 2016-03-18 DIAGNOSIS — M6281 Muscle weakness (generalized): Secondary | ICD-10-CM | POA: Diagnosis not present

## 2016-03-18 DIAGNOSIS — R278 Other lack of coordination: Secondary | ICD-10-CM | POA: Diagnosis not present

## 2016-03-18 DIAGNOSIS — R2689 Other abnormalities of gait and mobility: Secondary | ICD-10-CM | POA: Diagnosis not present

## 2016-03-22 DIAGNOSIS — M6281 Muscle weakness (generalized): Secondary | ICD-10-CM | POA: Diagnosis not present

## 2016-03-22 DIAGNOSIS — R278 Other lack of coordination: Secondary | ICD-10-CM | POA: Diagnosis not present

## 2016-03-22 DIAGNOSIS — R2689 Other abnormalities of gait and mobility: Secondary | ICD-10-CM | POA: Diagnosis not present

## 2016-03-23 DIAGNOSIS — R278 Other lack of coordination: Secondary | ICD-10-CM | POA: Diagnosis not present

## 2016-03-23 DIAGNOSIS — M6281 Muscle weakness (generalized): Secondary | ICD-10-CM | POA: Diagnosis not present

## 2016-03-23 DIAGNOSIS — R2689 Other abnormalities of gait and mobility: Secondary | ICD-10-CM | POA: Diagnosis not present

## 2016-03-25 DIAGNOSIS — R278 Other lack of coordination: Secondary | ICD-10-CM | POA: Diagnosis not present

## 2016-03-25 DIAGNOSIS — R2689 Other abnormalities of gait and mobility: Secondary | ICD-10-CM | POA: Diagnosis not present

## 2016-03-25 DIAGNOSIS — M6281 Muscle weakness (generalized): Secondary | ICD-10-CM | POA: Diagnosis not present

## 2016-03-29 DIAGNOSIS — R278 Other lack of coordination: Secondary | ICD-10-CM | POA: Diagnosis not present

## 2016-03-29 DIAGNOSIS — M6281 Muscle weakness (generalized): Secondary | ICD-10-CM | POA: Diagnosis not present

## 2016-03-29 DIAGNOSIS — R2689 Other abnormalities of gait and mobility: Secondary | ICD-10-CM | POA: Diagnosis not present

## 2016-03-30 DIAGNOSIS — M6281 Muscle weakness (generalized): Secondary | ICD-10-CM | POA: Diagnosis not present

## 2016-03-30 DIAGNOSIS — R2689 Other abnormalities of gait and mobility: Secondary | ICD-10-CM | POA: Diagnosis not present

## 2016-03-30 DIAGNOSIS — R278 Other lack of coordination: Secondary | ICD-10-CM | POA: Diagnosis not present

## 2016-03-31 DIAGNOSIS — H02401 Unspecified ptosis of right eyelid: Secondary | ICD-10-CM | POA: Diagnosis not present

## 2016-04-01 DIAGNOSIS — M6281 Muscle weakness (generalized): Secondary | ICD-10-CM | POA: Diagnosis not present

## 2016-04-01 DIAGNOSIS — R278 Other lack of coordination: Secondary | ICD-10-CM | POA: Diagnosis not present

## 2016-04-01 DIAGNOSIS — R2689 Other abnormalities of gait and mobility: Secondary | ICD-10-CM | POA: Diagnosis not present

## 2016-04-04 DIAGNOSIS — R2689 Other abnormalities of gait and mobility: Secondary | ICD-10-CM | POA: Diagnosis not present

## 2016-04-04 DIAGNOSIS — M6281 Muscle weakness (generalized): Secondary | ICD-10-CM | POA: Diagnosis not present

## 2016-04-04 DIAGNOSIS — R278 Other lack of coordination: Secondary | ICD-10-CM | POA: Diagnosis not present

## 2016-04-05 DIAGNOSIS — R2689 Other abnormalities of gait and mobility: Secondary | ICD-10-CM | POA: Diagnosis not present

## 2016-04-05 DIAGNOSIS — M6281 Muscle weakness (generalized): Secondary | ICD-10-CM | POA: Diagnosis not present

## 2016-04-05 DIAGNOSIS — R278 Other lack of coordination: Secondary | ICD-10-CM | POA: Diagnosis not present

## 2016-04-08 DIAGNOSIS — M6281 Muscle weakness (generalized): Secondary | ICD-10-CM | POA: Diagnosis not present

## 2016-04-08 DIAGNOSIS — R2689 Other abnormalities of gait and mobility: Secondary | ICD-10-CM | POA: Diagnosis not present

## 2016-04-08 DIAGNOSIS — R278 Other lack of coordination: Secondary | ICD-10-CM | POA: Diagnosis not present

## 2016-04-11 DIAGNOSIS — R278 Other lack of coordination: Secondary | ICD-10-CM | POA: Diagnosis not present

## 2016-04-11 DIAGNOSIS — R2689 Other abnormalities of gait and mobility: Secondary | ICD-10-CM | POA: Diagnosis not present

## 2016-04-11 DIAGNOSIS — M6281 Muscle weakness (generalized): Secondary | ICD-10-CM | POA: Diagnosis not present

## 2016-04-13 DIAGNOSIS — R2689 Other abnormalities of gait and mobility: Secondary | ICD-10-CM | POA: Diagnosis not present

## 2016-04-13 DIAGNOSIS — M6281 Muscle weakness (generalized): Secondary | ICD-10-CM | POA: Diagnosis not present

## 2016-04-13 DIAGNOSIS — R278 Other lack of coordination: Secondary | ICD-10-CM | POA: Diagnosis not present

## 2016-04-14 DIAGNOSIS — R2689 Other abnormalities of gait and mobility: Secondary | ICD-10-CM | POA: Diagnosis not present

## 2016-04-14 DIAGNOSIS — M6281 Muscle weakness (generalized): Secondary | ICD-10-CM | POA: Diagnosis not present

## 2016-04-14 DIAGNOSIS — R278 Other lack of coordination: Secondary | ICD-10-CM | POA: Diagnosis not present

## 2016-04-19 DIAGNOSIS — M6281 Muscle weakness (generalized): Secondary | ICD-10-CM | POA: Diagnosis not present

## 2016-04-19 DIAGNOSIS — R278 Other lack of coordination: Secondary | ICD-10-CM | POA: Diagnosis not present

## 2016-04-19 DIAGNOSIS — R2689 Other abnormalities of gait and mobility: Secondary | ICD-10-CM | POA: Diagnosis not present

## 2016-04-22 DIAGNOSIS — M6281 Muscle weakness (generalized): Secondary | ICD-10-CM | POA: Diagnosis not present

## 2016-04-22 DIAGNOSIS — R278 Other lack of coordination: Secondary | ICD-10-CM | POA: Diagnosis not present

## 2016-04-22 DIAGNOSIS — R2689 Other abnormalities of gait and mobility: Secondary | ICD-10-CM | POA: Diagnosis not present

## 2016-04-25 DIAGNOSIS — R278 Other lack of coordination: Secondary | ICD-10-CM | POA: Diagnosis not present

## 2016-04-25 DIAGNOSIS — R2689 Other abnormalities of gait and mobility: Secondary | ICD-10-CM | POA: Diagnosis not present

## 2016-04-25 DIAGNOSIS — M6281 Muscle weakness (generalized): Secondary | ICD-10-CM | POA: Diagnosis not present

## 2016-04-27 DIAGNOSIS — R278 Other lack of coordination: Secondary | ICD-10-CM | POA: Diagnosis not present

## 2016-04-27 DIAGNOSIS — M6281 Muscle weakness (generalized): Secondary | ICD-10-CM | POA: Diagnosis not present

## 2016-04-27 DIAGNOSIS — R2689 Other abnormalities of gait and mobility: Secondary | ICD-10-CM | POA: Diagnosis not present

## 2016-04-28 DIAGNOSIS — R2689 Other abnormalities of gait and mobility: Secondary | ICD-10-CM | POA: Diagnosis not present

## 2016-04-28 DIAGNOSIS — R278 Other lack of coordination: Secondary | ICD-10-CM | POA: Diagnosis not present

## 2016-04-28 DIAGNOSIS — M6281 Muscle weakness (generalized): Secondary | ICD-10-CM | POA: Diagnosis not present

## 2016-04-30 ENCOUNTER — Other Ambulatory Visit: Payer: Self-pay | Admitting: Family Medicine

## 2016-04-30 DIAGNOSIS — E039 Hypothyroidism, unspecified: Secondary | ICD-10-CM

## 2016-05-03 DIAGNOSIS — R2689 Other abnormalities of gait and mobility: Secondary | ICD-10-CM | POA: Diagnosis not present

## 2016-05-03 DIAGNOSIS — R278 Other lack of coordination: Secondary | ICD-10-CM | POA: Diagnosis not present

## 2016-05-03 DIAGNOSIS — M6281 Muscle weakness (generalized): Secondary | ICD-10-CM | POA: Diagnosis not present

## 2016-05-04 ENCOUNTER — Other Ambulatory Visit: Payer: Self-pay

## 2016-05-04 DIAGNOSIS — M6281 Muscle weakness (generalized): Secondary | ICD-10-CM | POA: Diagnosis not present

## 2016-05-04 DIAGNOSIS — R2689 Other abnormalities of gait and mobility: Secondary | ICD-10-CM | POA: Diagnosis not present

## 2016-05-04 DIAGNOSIS — R278 Other lack of coordination: Secondary | ICD-10-CM | POA: Diagnosis not present

## 2016-05-04 DIAGNOSIS — E039 Hypothyroidism, unspecified: Secondary | ICD-10-CM

## 2016-05-05 ENCOUNTER — Other Ambulatory Visit: Payer: Self-pay | Admitting: Family Medicine

## 2016-05-06 DIAGNOSIS — R278 Other lack of coordination: Secondary | ICD-10-CM | POA: Diagnosis not present

## 2016-05-06 DIAGNOSIS — M6281 Muscle weakness (generalized): Secondary | ICD-10-CM | POA: Diagnosis not present

## 2016-05-06 DIAGNOSIS — R2689 Other abnormalities of gait and mobility: Secondary | ICD-10-CM | POA: Diagnosis not present

## 2016-05-11 DIAGNOSIS — R2689 Other abnormalities of gait and mobility: Secondary | ICD-10-CM | POA: Diagnosis not present

## 2016-05-11 DIAGNOSIS — M6281 Muscle weakness (generalized): Secondary | ICD-10-CM | POA: Diagnosis not present

## 2016-05-11 DIAGNOSIS — R278 Other lack of coordination: Secondary | ICD-10-CM | POA: Diagnosis not present

## 2016-05-12 DIAGNOSIS — R2689 Other abnormalities of gait and mobility: Secondary | ICD-10-CM | POA: Diagnosis not present

## 2016-05-12 DIAGNOSIS — M6281 Muscle weakness (generalized): Secondary | ICD-10-CM | POA: Diagnosis not present

## 2016-05-12 DIAGNOSIS — R278 Other lack of coordination: Secondary | ICD-10-CM | POA: Diagnosis not present

## 2016-05-17 DIAGNOSIS — R278 Other lack of coordination: Secondary | ICD-10-CM | POA: Diagnosis not present

## 2016-05-17 DIAGNOSIS — R2689 Other abnormalities of gait and mobility: Secondary | ICD-10-CM | POA: Diagnosis not present

## 2016-05-17 DIAGNOSIS — M6281 Muscle weakness (generalized): Secondary | ICD-10-CM | POA: Diagnosis not present

## 2016-05-19 DIAGNOSIS — R278 Other lack of coordination: Secondary | ICD-10-CM | POA: Diagnosis not present

## 2016-05-19 DIAGNOSIS — R2689 Other abnormalities of gait and mobility: Secondary | ICD-10-CM | POA: Diagnosis not present

## 2016-05-19 DIAGNOSIS — M6281 Muscle weakness (generalized): Secondary | ICD-10-CM | POA: Diagnosis not present

## 2016-05-20 DIAGNOSIS — E039 Hypothyroidism, unspecified: Secondary | ICD-10-CM | POA: Diagnosis not present

## 2016-05-21 LAB — T4: T4, Total: 8.7 ug/dL (ref 4.5–12.0)

## 2016-05-21 LAB — COMPREHENSIVE METABOLIC PANEL
A/G RATIO: 1.4 (ref 1.2–2.2)
ALBUMIN: 4.3 g/dL (ref 3.2–4.6)
ALT: 10 IU/L (ref 0–32)
AST: 18 IU/L (ref 0–40)
Alkaline Phosphatase: 127 IU/L — ABNORMAL HIGH (ref 39–117)
BILIRUBIN TOTAL: 0.4 mg/dL (ref 0.0–1.2)
BUN / CREAT RATIO: 23 (ref 12–28)
BUN: 16 mg/dL (ref 10–36)
CO2: 24 mmol/L (ref 18–29)
Calcium: 9.7 mg/dL (ref 8.7–10.3)
Chloride: 92 mmol/L — ABNORMAL LOW (ref 96–106)
Creatinine, Ser: 0.69 mg/dL (ref 0.57–1.00)
GFR, EST AFRICAN AMERICAN: 83 mL/min/{1.73_m2} (ref >59)
GFR, EST NON AFRICAN AMERICAN: 72 mL/min/{1.73_m2} (ref >59)
GLUCOSE: 97 mg/dL (ref 65–99)
Globulin, Total: 3.1 g/dL (ref 1.5–4.5)
Potassium: 4.7 mmol/L (ref 3.5–5.2)
Sodium: 132 mmol/L — ABNORMAL LOW (ref 134–144)
TOTAL PROTEIN: 7.4 g/dL (ref 6.0–8.5)

## 2016-05-21 LAB — CBC WITH DIFFERENTIAL/PLATELET
BASOS: 0 %
Basophils Absolute: 0 10*3/uL (ref 0.0–0.2)
EOS (ABSOLUTE): 0.3 10*3/uL (ref 0.0–0.4)
Eos: 4 %
Hematocrit: 39 % (ref 34.0–46.6)
Hemoglobin: 12.5 g/dL (ref 11.1–15.9)
Immature Grans (Abs): 0 10*3/uL (ref 0.0–0.1)
Immature Granulocytes: 0 %
Lymphocytes Absolute: 3.2 10*3/uL — ABNORMAL HIGH (ref 0.7–3.1)
Lymphs: 33 %
MCH: 27.3 pg (ref 26.6–33.0)
MCHC: 32.1 g/dL (ref 31.5–35.7)
MCV: 85 fL (ref 79–97)
MONOS ABS: 0.9 10*3/uL (ref 0.1–0.9)
Monocytes: 10 %
NEUTROS ABS: 5.1 10*3/uL (ref 1.4–7.0)
Neutrophils: 53 %
PLATELETS: 318 10*3/uL (ref 150–379)
RBC: 4.58 x10E6/uL (ref 3.77–5.28)
RDW: 13.3 % (ref 12.3–15.4)
WBC: 9.6 10*3/uL (ref 3.4–10.8)

## 2016-05-21 LAB — TSH: TSH: 5.72 u[IU]/mL — ABNORMAL HIGH (ref 0.450–4.500)

## 2016-05-24 DIAGNOSIS — R278 Other lack of coordination: Secondary | ICD-10-CM | POA: Diagnosis not present

## 2016-05-24 DIAGNOSIS — M6281 Muscle weakness (generalized): Secondary | ICD-10-CM | POA: Diagnosis not present

## 2016-05-24 DIAGNOSIS — R2689 Other abnormalities of gait and mobility: Secondary | ICD-10-CM | POA: Diagnosis not present

## 2016-05-31 DIAGNOSIS — R278 Other lack of coordination: Secondary | ICD-10-CM | POA: Diagnosis not present

## 2016-05-31 DIAGNOSIS — R2689 Other abnormalities of gait and mobility: Secondary | ICD-10-CM | POA: Diagnosis not present

## 2016-05-31 DIAGNOSIS — M6281 Muscle weakness (generalized): Secondary | ICD-10-CM | POA: Diagnosis not present

## 2016-06-02 DIAGNOSIS — R278 Other lack of coordination: Secondary | ICD-10-CM | POA: Diagnosis not present

## 2016-06-02 DIAGNOSIS — M6281 Muscle weakness (generalized): Secondary | ICD-10-CM | POA: Diagnosis not present

## 2016-06-02 DIAGNOSIS — R2689 Other abnormalities of gait and mobility: Secondary | ICD-10-CM | POA: Diagnosis not present

## 2016-06-03 DIAGNOSIS — M6281 Muscle weakness (generalized): Secondary | ICD-10-CM | POA: Diagnosis not present

## 2016-06-03 DIAGNOSIS — R2689 Other abnormalities of gait and mobility: Secondary | ICD-10-CM | POA: Diagnosis not present

## 2016-06-03 DIAGNOSIS — R278 Other lack of coordination: Secondary | ICD-10-CM | POA: Diagnosis not present

## 2016-06-06 DIAGNOSIS — R278 Other lack of coordination: Secondary | ICD-10-CM | POA: Diagnosis not present

## 2016-06-06 DIAGNOSIS — M6281 Muscle weakness (generalized): Secondary | ICD-10-CM | POA: Diagnosis not present

## 2016-06-06 DIAGNOSIS — R2689 Other abnormalities of gait and mobility: Secondary | ICD-10-CM | POA: Diagnosis not present

## 2016-06-07 DIAGNOSIS — M6281 Muscle weakness (generalized): Secondary | ICD-10-CM | POA: Diagnosis not present

## 2016-06-07 DIAGNOSIS — R2689 Other abnormalities of gait and mobility: Secondary | ICD-10-CM | POA: Diagnosis not present

## 2016-06-07 DIAGNOSIS — R278 Other lack of coordination: Secondary | ICD-10-CM | POA: Diagnosis not present

## 2016-06-10 DIAGNOSIS — R278 Other lack of coordination: Secondary | ICD-10-CM | POA: Diagnosis not present

## 2016-06-10 DIAGNOSIS — M6281 Muscle weakness (generalized): Secondary | ICD-10-CM | POA: Diagnosis not present

## 2016-06-10 DIAGNOSIS — R2689 Other abnormalities of gait and mobility: Secondary | ICD-10-CM | POA: Diagnosis not present

## 2016-06-13 DIAGNOSIS — R278 Other lack of coordination: Secondary | ICD-10-CM | POA: Diagnosis not present

## 2016-06-13 DIAGNOSIS — R2689 Other abnormalities of gait and mobility: Secondary | ICD-10-CM | POA: Diagnosis not present

## 2016-06-13 DIAGNOSIS — M6281 Muscle weakness (generalized): Secondary | ICD-10-CM | POA: Diagnosis not present

## 2016-06-16 DIAGNOSIS — H02401 Unspecified ptosis of right eyelid: Secondary | ICD-10-CM | POA: Diagnosis not present

## 2016-06-17 ENCOUNTER — Ambulatory Visit (INDEPENDENT_AMBULATORY_CARE_PROVIDER_SITE_OTHER): Payer: Medicare Other | Admitting: Family Medicine

## 2016-06-17 ENCOUNTER — Encounter: Payer: Self-pay | Admitting: Family Medicine

## 2016-06-17 VITALS — BP 136/58 | HR 58 | Temp 97.9°F | Resp 18 | Ht 61.0 in | Wt 95.0 lb

## 2016-06-17 DIAGNOSIS — R600 Localized edema: Secondary | ICD-10-CM | POA: Insufficient documentation

## 2016-06-17 DIAGNOSIS — E039 Hypothyroidism, unspecified: Secondary | ICD-10-CM

## 2016-06-17 DIAGNOSIS — I10 Essential (primary) hypertension: Secondary | ICD-10-CM | POA: Insufficient documentation

## 2016-06-17 DIAGNOSIS — K922 Gastrointestinal hemorrhage, unspecified: Secondary | ICD-10-CM | POA: Insufficient documentation

## 2016-06-17 DIAGNOSIS — K219 Gastro-esophageal reflux disease without esophagitis: Secondary | ICD-10-CM | POA: Insufficient documentation

## 2016-06-17 DIAGNOSIS — M1990 Unspecified osteoarthritis, unspecified site: Secondary | ICD-10-CM | POA: Insufficient documentation

## 2016-06-17 DIAGNOSIS — K279 Peptic ulcer, site unspecified, unspecified as acute or chronic, without hemorrhage or perforation: Secondary | ICD-10-CM | POA: Insufficient documentation

## 2016-06-17 DIAGNOSIS — E871 Hypo-osmolality and hyponatremia: Secondary | ICD-10-CM | POA: Diagnosis not present

## 2016-06-17 DIAGNOSIS — M810 Age-related osteoporosis without current pathological fracture: Secondary | ICD-10-CM | POA: Insufficient documentation

## 2016-06-17 DIAGNOSIS — K559 Vascular disorder of intestine, unspecified: Secondary | ICD-10-CM | POA: Insufficient documentation

## 2016-06-17 NOTE — Progress Notes (Signed)
Patient: [name] Female    DOB: 07-30-1914   82 y.o.   MRN: [id number] Visit Date: 06/17/2016  Today's Provider: Vernie Murders, PA   Chief Complaint  Patient presents with  . Hypertension  . Hypothyroidism   Subjective:    HPI  Hypertension, follow-up:  BP Readings from Last 3 Encounters:  06/17/16 136/58  12/21/15 111/59  07/17/14 167/91    She was last seen for hypertension 2 years ago.  BP at that visit was 167/90. Management since that visit includes no changes. She reports good compliance with treatment. She is not having side effects.  She is not exercising. She is adherent to low salt diet.   Outside blood pressures are not checked. Patient denies chest pain, lower extremity edema, palpitations and tachypnea.     Weight trend: stable Wt Readings from Last 3 Encounters:  06/17/16 95 lb (43.092 kg)  12/14/15 104 lb (47.174 kg)  07/17/14 120 lb (54.432 kg)    Current diet: well balanced   Hypothyroidism: Patient is here to follow up on her thyroid. Last TSH was checked on 05/20/2016 and it was 5.7. Patient was advised to continue taking Levothyroxine 89mcg daily. Patient reports that she has been taking medication with no side effects.   Past Medical History  Diagnosis Date  . Eye infection   . Falls   . Pelvic fracture [facility])    Patient Active Problem List   Diagnosis Date Noted  . Edema leg 06/17/2016  . Acid reflux 06/17/2016  . Bleeding gastrointestinal 06/17/2016  . BP (high blood pressure) 06/17/2016  . Adult hypothyroidism 06/17/2016  . Colitis, ischemic ([place]) 06/17/2016  . Osteoarthrosis 06/17/2016  . OP (osteoporosis) 06/17/2016  . Gastroduodenal ulcer 06/17/2016  . Chronic follicular conjunctivitis 03/02/2016  . History of surgical procedure 03/02/2016  . 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 Personal history of healed traumatic fracture 12/21/2015  . Corneal perforation of left eye 12/18/2015  . Hyponatremia 12/16/2015  .  Rib fracture 12/14/2015   Past Surgical History  Procedure Laterality Date  . Abdominal hysterectomy    . Cesarean section     Family History  Problem Relation Age of Onset  . Cancer Sister   . Cancer Sister    No Known Allergies   Current Meds  Medication Sig  . amLODipine (NORVASC) 10 MG tablet Take 1 tablet (10 mg total) by mouth daily.  . brimonidine (ALPHAGAN) 0.15 % ophthalmic solution Place 1 drop into the left eye 2 (two) times daily.  . DUREZOL 0.05 % EMUL Place 1 drop into the left eye 4 (four) times daily.   . feeding supplement, ENSURE ENLIVE, (ENSURE ENLIVE) LIQD Take 237 mLs by mouth 3 (three) times daily with meals.  Marland Kitchen levobunolol (BETAGAN) 0.5 % ophthalmic solution Place 1 drop into both eyes 2 (two) times daily.   Marland Kitchen levofloxacin (QUIXIN) 0.5 % ophthalmic solution Place 1 drop into the left eye 3 (three) times daily.   Marland Kitchen levothyroxine (SYNTHROID, LEVOTHROID) 50 MCG tablet Take 1 tablet (50 mcg total) by mouth daily.  . metoprolol tartrate (LOPRESSOR) 25 MG tablet Take 2 tablets (50 mg total) by mouth daily.  . NONFORMULARY OR COMPOUNDED ITEM Place 1 drop into the left eye 4 (four) times daily.  Marland Kitchen oxyCODONE-acetaminophen (PERCOCET/ROXICET) 5-325 MG tablet Take 1 tablet by mouth every 8 (eight) hours as needed for moderate pain or severe pain.  . pantoprazole (PROTONIX) 40 MG tablet TAKE 1 TABLET TWICE A DAY  .  ramipril (ALTACE) 5 MG capsule Take 1 capsule (5 mg total) by mouth daily.  Marland Kitchen tolvaptan (SAMSCA) 15 MG TABS tablet Take 1 tablet (15 mg total) by mouth daily.    Review of Systems  Constitutional: Negative.   HENT: Positive for postnasal drip.   Eyes: Positive for discharge.  Respiratory: Negative.   Cardiovascular: Negative.   Musculoskeletal: Positive for arthralgias.  Neurological: Negative.     Social History  Substance Use Topics  . Smoking status: Never Smoker   . Smokeless tobacco: Never Used  . Alcohol Use: No   Objective:   BP 136/58 mmHg   Pulse 58  Temp(Src) 97.9 F (36.6 C)  Resp 18  Ht 5\' 1"  (1.549 m)  Wt 95 lb (43.092 kg)  BMI 17.96 kg/m2 Wt Readings from Last 3 Encounters:  06/17/16 95 lb (43.092 kg)  12/14/15 104 lb (47.174 kg)  07/17/14 120 lb (54.432 kg)    Physical Exam  Constitutional: She is oriented to person, place, and time. She appears well-developed and well-nourished.  Thin/cachetic  HENT:  Head: Normocephalic.  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 Had eyelid lift surgery on the right upper lid yesterday.  Cardiovascular: Normal rate and regular rhythm.   Pulmonary/Chest: Breath sounds normal.  Abdominal: Soft. Bowel sounds are normal.  Musculoskeletal: She exhibits no edema.  Neurological: She is alert and oriented to person, place, and time.      Assessment & Plan:     1. Hypothyroidism, unspecified hypothyroidism type TSH high at 5.720 with normal T4 at 8.7 on 05-20-16. Tolerating Synthroid 50 mcg qd. Has lost 9 lbs since 12-14-15. Feeling well in general. Good mental capacities and very lucid.  2. Hyponatremia Labs on 05-20-16 showed sodium 132 with chloride 92. May get a little salt in diet now and continue present medications. Recheck in 6 months.       Vernie Murders, PA  [facility]

## 2016-06-27 DIAGNOSIS — M6281 Muscle weakness (generalized): Secondary | ICD-10-CM | POA: Diagnosis not present

## 2016-06-27 DIAGNOSIS — R278 Other lack of coordination: Secondary | ICD-10-CM | POA: Diagnosis not present

## 2016-06-27 DIAGNOSIS — R2689 Other abnormalities of gait and mobility: Secondary | ICD-10-CM | POA: Diagnosis not present

## 2016-07-07 ENCOUNTER — Other Ambulatory Visit
Admission: RE | Admit: 2016-07-07 | Discharge: 2016-07-07 | Disposition: A | Discharge status: Home / Self Care | Payer: Medicare Other | Source: Ambulatory Visit | Attending: Ophthalmology | Admitting: Ophthalmology

## 2016-07-07 DIAGNOSIS — H04332 Acute lacrimal canaliculitis of left lacrimal passage: Secondary | ICD-10-CM | POA: Insufficient documentation

## 2016-07-10 LAB — AEROBIC CULTURE W GRAM STAIN (SUPERFICIAL SPECIMEN): Gram Stain: NONE SEEN

## 2016-07-10 LAB — AEROBIC CULTURE  (SUPERFICIAL SPECIMEN): CULTURE: NO GROWTH

## 2016-07-15 NOTE — Telephone Encounter (Signed)
error 

## 2016-07-29 ENCOUNTER — Emergency Department: Payer: Medicare Other

## 2016-07-29 ENCOUNTER — Inpatient Hospital Stay
Admission: EM | Admit: 2016-07-29 | Discharge: 2016-08-01 | DRG: 536 | Disposition: A | Discharge status: Skilled Nursing Facility | Payer: Medicare Other | Attending: Internal Medicine | Admitting: Internal Medicine

## 2016-07-29 DIAGNOSIS — Z66 Do not resuscitate: Secondary | ICD-10-CM | POA: Diagnosis present

## 2016-07-29 DIAGNOSIS — Z8711 Personal history of peptic ulcer disease: Secondary | ICD-10-CM

## 2016-07-29 DIAGNOSIS — Z79899 Other long term (current) drug therapy: Secondary | ICD-10-CM | POA: Diagnosis not present

## 2016-07-29 DIAGNOSIS — M6281 Muscle weakness (generalized): Secondary | ICD-10-CM | POA: Diagnosis not present

## 2016-07-29 DIAGNOSIS — E039 Hypothyroidism, unspecified: Secondary | ICD-10-CM | POA: Diagnosis not present

## 2016-07-29 DIAGNOSIS — I10 Essential (primary) hypertension: Secondary | ICD-10-CM | POA: Diagnosis present

## 2016-07-29 DIAGNOSIS — S32402A Unspecified fracture of left acetabulum, initial encounter for closed fracture: Secondary | ICD-10-CM | POA: Diagnosis present

## 2016-07-29 DIAGNOSIS — Y92129 Unspecified place in nursing home as the place of occurrence of the external cause: Secondary | ICD-10-CM

## 2016-07-29 DIAGNOSIS — Z809 Family history of malignant neoplasm, unspecified: Secondary | ICD-10-CM

## 2016-07-29 DIAGNOSIS — R262 Difficulty in walking, not elsewhere classified: Secondary | ICD-10-CM | POA: Diagnosis not present

## 2016-07-29 DIAGNOSIS — K279 Peptic ulcer, site unspecified, unspecified as acute or chronic, without hemorrhage or perforation: Secondary | ICD-10-CM | POA: Diagnosis not present

## 2016-07-29 DIAGNOSIS — S299XXA Unspecified injury of thorax, initial encounter: Secondary | ICD-10-CM | POA: Diagnosis not present

## 2016-07-29 DIAGNOSIS — W19XXXA Unspecified fall, initial encounter: Secondary | ICD-10-CM | POA: Diagnosis not present

## 2016-07-29 DIAGNOSIS — Z90710 Acquired absence of both cervix and uterus: Secondary | ICD-10-CM | POA: Diagnosis not present

## 2016-07-29 DIAGNOSIS — S0990XA Unspecified injury of head, initial encounter: Secondary | ICD-10-CM | POA: Diagnosis not present

## 2016-07-29 DIAGNOSIS — Z743 Need for continuous supervision: Secondary | ICD-10-CM | POA: Diagnosis not present

## 2016-07-29 DIAGNOSIS — Z9181 History of falling: Secondary | ICD-10-CM | POA: Diagnosis not present

## 2016-07-29 DIAGNOSIS — S329XXA Fracture of unspecified parts of lumbosacral spine and pelvis, initial encounter for closed fracture: Secondary | ICD-10-CM | POA: Diagnosis not present

## 2016-07-29 DIAGNOSIS — E871 Hypo-osmolality and hyponatremia: Secondary | ICD-10-CM | POA: Diagnosis present

## 2016-07-29 DIAGNOSIS — M80052D Age-related osteoporosis with current pathological fracture, left femur, subsequent encounter for fracture with routine healing: Secondary | ICD-10-CM | POA: Diagnosis not present

## 2016-07-29 DIAGNOSIS — H409 Unspecified glaucoma: Secondary | ICD-10-CM | POA: Diagnosis not present

## 2016-07-29 DIAGNOSIS — S79912A Unspecified injury of left hip, initial encounter: Secondary | ICD-10-CM | POA: Diagnosis not present

## 2016-07-29 DIAGNOSIS — S3993XA Unspecified injury of pelvis, initial encounter: Secondary | ICD-10-CM | POA: Diagnosis not present

## 2016-07-29 DIAGNOSIS — K219 Gastro-esophageal reflux disease without esophagitis: Secondary | ICD-10-CM | POA: Diagnosis not present

## 2016-07-29 DIAGNOSIS — Z9001 Acquired absence of eye: Secondary | ICD-10-CM | POA: Diagnosis not present

## 2016-07-29 DIAGNOSIS — M25552 Pain in left hip: Secondary | ICD-10-CM | POA: Diagnosis not present

## 2016-07-29 DIAGNOSIS — I160 Hypertensive urgency: Secondary | ICD-10-CM | POA: Diagnosis present

## 2016-07-29 LAB — COMPREHENSIVE METABOLIC PANEL
ALBUMIN: 3.8 g/dL (ref 3.5–5.0)
ALT: 20 U/L (ref 14–54)
ANION GAP: 5 (ref 5–15)
AST: 29 U/L (ref 15–41)
Alkaline Phosphatase: 88 U/L (ref 38–126)
BILIRUBIN TOTAL: 0.6 mg/dL (ref 0.3–1.2)
BUN: 19 mg/dL (ref 6–20)
CALCIUM: 8.8 mg/dL — AB (ref 8.9–10.3)
CO2: 26 mmol/L (ref 22–32)
Chloride: 100 mmol/L — ABNORMAL LOW (ref 101–111)
Creatinine, Ser: 0.55 mg/dL (ref 0.44–1.00)
GFR calc Af Amer: >60 mL/min (ref >60)
GFR calc non Af Amer: >60 mL/min (ref >60)
GLUCOSE: 108 mg/dL — AB (ref 65–99)
POTASSIUM: 4 mmol/L (ref 3.5–5.1)
SODIUM: 131 mmol/L — AB (ref 135–145)
TOTAL PROTEIN: 6.9 g/dL (ref 6.5–8.1)

## 2016-07-29 LAB — CBC WITH DIFFERENTIAL/PLATELET
BASOS ABS: 0.1 10*3/uL (ref 0–0.1)
BASOS PCT: 1 %
EOS ABS: 0.2 10*3/uL (ref 0–0.7)
Eosinophils Relative: 1 %
HEMATOCRIT: 34.5 % — AB (ref 35.0–47.0)
Hemoglobin: 11.4 g/dL — ABNORMAL LOW (ref 12.0–16.0)
Lymphocytes Relative: 13 %
Lymphs Abs: 1.6 10*3/uL (ref 1.0–3.6)
MCH: 26.9 pg (ref 26.0–34.0)
MCHC: 33.1 g/dL (ref 32.0–36.0)
MCV: 81.5 fL (ref 80.0–100.0)
MONO ABS: 0.7 10*3/uL (ref 0.2–0.9)
Monocytes Relative: 6 %
NEUTROS ABS: 10.1 10*3/uL — AB (ref 1.4–6.5)
Neutrophils Relative %: 79 %
PLATELETS: 229 10*3/uL (ref 150–440)
RBC: 4.23 MIL/uL (ref 3.80–5.20)
RDW: 15.2 % — AB (ref 11.5–14.5)
WBC: 12.8 10*3/uL — ABNORMAL HIGH (ref 3.6–11.0)

## 2016-07-29 MED ORDER — SODIUM CHLORIDE 0.9 % IV SOLN
INTRAVENOUS | Status: AC
  Administered 2016-07-29: 22:00:00 via INTRAVENOUS

## 2016-07-29 MED ORDER — ENSURE ENLIVE PO LIQD
237.0000 mL | Freq: Three times a day (TID) | ORAL | Status: DC
  Administered 2016-07-30 – 2016-08-01 (×7): 237 mL via ORAL

## 2016-07-29 MED ORDER — METOPROLOL TARTRATE 50 MG PO TABS
50.0000 mg | ORAL_TABLET | Freq: Every day | ORAL | Status: DC
  Administered 2016-07-30 – 2016-08-01 (×3): 50 mg via ORAL
  Filled 2016-07-29 (×3): qty 1

## 2016-07-29 MED ORDER — MORPHINE SULFATE (PF) 4 MG/ML IV SOLN
4.0000 mg | Freq: Once | INTRAVENOUS | Status: AC
  Administered 2016-07-29: 4 mg via INTRAVENOUS
  Filled 2016-07-29: qty 1

## 2016-07-29 MED ORDER — LEVOBUNOLOL HCL 0.5 % OP SOLN
1.0000 [drp] | Freq: Two times a day (BID) | OPHTHALMIC | Status: DC
  Administered 2016-07-29 – 2016-08-01 (×6): 1 [drp] via OPHTHALMIC
  Filled 2016-07-29: qty 5

## 2016-07-29 MED ORDER — PANTOPRAZOLE SODIUM 40 MG PO TBEC
40.0000 mg | DELAYED_RELEASE_TABLET | Freq: Two times a day (BID) | ORAL | Status: DC
Start: 2016-07-29 — End: 2016-08-01
  Administered 2016-07-30 – 2016-08-01 (×5): 40 mg via ORAL
  Filled 2016-07-29 (×6): qty 1

## 2016-07-29 MED ORDER — AMLODIPINE BESYLATE 10 MG PO TABS
10.0000 mg | ORAL_TABLET | Freq: Every day | ORAL | Status: DC
  Administered 2016-07-30 – 2016-08-01 (×3): 10 mg via ORAL
  Filled 2016-07-29 (×3): qty 1

## 2016-07-29 MED ORDER — ENOXAPARIN SODIUM 30 MG/0.3ML ~~LOC~~ SOLN
30.0000 mg | SUBCUTANEOUS | Status: DC
  Administered 2016-07-29 – 2016-07-31 (×3): 30 mg via SUBCUTANEOUS
  Filled 2016-07-29 (×3): qty 0.3

## 2016-07-29 MED ORDER — ENOXAPARIN SODIUM 40 MG/0.4ML ~~LOC~~ SOLN: 40.0000 mg | SUBCUTANEOUS | Status: DC

## 2016-07-29 MED ORDER — METOPROLOL TARTRATE 5 MG/5ML IV SOLN: 5.0000 mg | Freq: Four times a day (QID) | INTRAVENOUS | Status: DC | PRN

## 2016-07-29 MED ORDER — ONDANSETRON HCL 4 MG PO TABS: 4.0000 mg | ORAL_TABLET | Freq: Four times a day (QID) | ORAL | Status: DC | PRN

## 2016-07-29 MED ORDER — ONDANSETRON HCL 4 MG/2ML IJ SOLN: 4.0000 mg | Freq: Four times a day (QID) | INTRAMUSCULAR | Status: DC | PRN

## 2016-07-29 MED ORDER — OXYCODONE-ACETAMINOPHEN 5-325 MG PO TABS
1.0000 | ORAL_TABLET | Freq: Once | ORAL | Status: AC
  Administered 2016-07-29: 1 via ORAL
  Filled 2016-07-29: qty 1

## 2016-07-29 MED ORDER — ACETAMINOPHEN 325 MG PO TABS
650.0000 mg | ORAL_TABLET | Freq: Four times a day (QID) | ORAL | Status: DC | PRN
  Administered 2016-07-29 – 2016-07-30 (×3): 650 mg via ORAL
  Filled 2016-07-29 (×4): qty 2

## 2016-07-29 MED ORDER — SODIUM CHLORIDE 0.9% FLUSH
3.0000 mL | Freq: Two times a day (BID) | INTRAVENOUS | Status: DC
  Administered 2016-07-30 – 2016-08-01 (×5): 3 mL via INTRAVENOUS

## 2016-07-29 MED ORDER — DOCUSATE SODIUM 100 MG PO CAPS
100.0000 mg | ORAL_CAPSULE | Freq: Two times a day (BID) | ORAL | Status: DC
  Administered 2016-07-30 – 2016-08-01 (×5): 100 mg via ORAL
  Filled 2016-07-29 (×6): qty 1

## 2016-07-29 MED ORDER — MORPHINE SULFATE (PF) 2 MG/ML IV SOLN: 2.0000 mg | INTRAVENOUS | Status: DC | PRN

## 2016-07-29 MED ORDER — RAMIPRIL 5 MG PO CAPS
10.0000 mg | ORAL_CAPSULE | Freq: Every day | ORAL | Status: DC
  Administered 2016-07-30 – 2016-08-01 (×3): 10 mg via ORAL
  Filled 2016-07-29 (×4): qty 2

## 2016-07-29 MED ORDER — OXYCODONE HCL 5 MG PO TABS
5.0000 mg | ORAL_TABLET | ORAL | Status: DC | PRN
  Administered 2016-07-31 (×2): 5 mg via ORAL
  Filled 2016-07-29 (×2): qty 1

## 2016-07-29 MED ORDER — ACETAMINOPHEN 650 MG RE SUPP: 650.0000 mg | Freq: Four times a day (QID) | RECTAL | Status: DC | PRN

## 2016-07-29 MED ORDER — LEVOTHYROXINE SODIUM 50 MCG PO TABS
50.0000 ug | ORAL_TABLET | Freq: Every day | ORAL | Status: DC
  Administered 2016-07-30 – 2016-08-01 (×2): 50 ug via ORAL
  Filled 2016-07-29 (×2): qty 1

## 2016-07-29 MED ORDER — OXYCODONE-ACETAMINOPHEN 5-325 MG PO TABS
1.0000 | ORAL_TABLET | Freq: Three times a day (TID) | ORAL | Status: DC | PRN
  Administered 2016-07-30 – 2016-07-31 (×4): 1 via ORAL
  Filled 2016-07-29 (×4): qty 1

## 2016-07-29 NOTE — ED Triage Notes (Signed)
Pt arrives here via [facility] from [place] was getting out of the chair to walk to the dining room this am when she turned "wrong" and she fell  Resulting in left hip pain  Pt reports that she did hit her head on a cabinet when she fell

## 2016-07-29 NOTE — ED Provider Notes (Signed)
[facility] Provider Note   CSN: [id number] Arrival date & time: 07/29/16  1200     History   Chief Complaint Chief Complaint  Patient presents with  . Fall  . Hip Pain    HPI [name] is a 82 y.o. female hx of GI bleed, HTN, osteoporosis, L hip fracture s/p rod placement and previous pelvic fracture here with fall. Patient was in her kitchen this morning and the left leg gave out and she landed on the left hip. She states that she has severe left hip pain and was unable to walk afterwards. She did hit her head on the cabinet but denies any headache or neck pain. Patient not on blood thinners.   The history is provided by the patient.    Past Medical History:  Diagnosis Date  . Eye infection   . Falls   . Pelvic fracture [facility])     Patient Active Problem List   Diagnosis Date Noted  . Edema leg 06/17/2016  . Acid reflux 06/17/2016  . Bleeding gastrointestinal 06/17/2016  . BP (high blood pressure) 06/17/2016  . Adult hypothyroidism 06/17/2016  . Colitis, ischemic ([place]) 06/17/2016  . Osteoarthrosis 06/17/2016  . OP (osteoporosis) 06/17/2016  . Gastroduodenal ulcer 06/17/2016  . Chronic follicular conjunctivitis 03/02/2016  . History of surgical procedure 03/02/2016  . 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 Personal history of healed traumatic fracture 12/21/2015  . Corneal perforation of left eye 12/18/2015  . Hyponatremia 12/16/2015  . Rib fracture 12/14/2015    Past Surgical History:  Procedure Laterality Date  . ABDOMINAL HYSTERECTOMY    . CESAREAN SECTION      OB History    No data available       Home Medications    Prior to Admission medications   Medication Sig Start Date End Date Taking? Authorizing Provider  amLODipine (NORVASC) 10 MG tablet Take 1 tablet (10 mg total) by mouth daily. 12/20/15   Vaughan Basta, MD  brimonidine (ALPHAGAN) 0.15 % ophthalmic solution Place 1 drop into the left eye 2 (two) times daily.  12/20/15   Vaughan Basta, MD  DUREZOL 0.05 % EMUL Place 1 drop into the left eye 4 (four) times daily.     Historical Provider, MD  feeding supplement, ENSURE ENLIVE, (ENSURE ENLIVE) LIQD Take 237 mLs by mouth 3 (three) times daily with meals. 12/18/15   Aldean Jewett, MD  levobunolol (BETAGAN) 0.5 % ophthalmic solution Place 1 drop into both eyes 2 (two) times daily.     Historical Provider, MD  levofloxacin Theodoro Clock) 0.5 % ophthalmic solution Place 1 drop into the left eye 3 (three) times daily.     Historical Provider, MD  levothyroxine (SYNTHROID, LEVOTHROID) 50 MCG tablet Take 1 tablet (50 mcg total) by mouth daily. 06/17/15   Margarita Rana, MD  metoprolol tartrate (LOPRESSOR) 25 MG tablet Take 2 tablets (50 mg total) by mouth daily. 06/17/15   Margarita Rana, MD  NONFORMULARY OR COMPOUNDED ITEM Place 1 drop into the left eye 4 (four) times daily. 12/18/15   Aldean Jewett, MD  oxyCODONE-acetaminophen (PERCOCET/ROXICET) 5-325 MG tablet Take 1 tablet by mouth every 8 (eight) hours as needed for moderate pain or severe pain. 12/20/15   Vaughan Basta, MD  pantoprazole (PROTONIX) 40 MG tablet TAKE 1 TABLET TWICE A DAY 05/06/16   Birdie Sons, MD  ramipril (ALTACE) 5 MG capsule Take 1 capsule (5 mg total) by mouth daily. 06/17/15   Margarita Rana, MD  tolvaptan (  SAMSCA) 15 MG TABS tablet Take 1 tablet (15 mg total) by mouth daily. 12/20/15   Vaughan Basta, MD    Family History Family History  Problem Relation Age of Onset  . Cancer Sister   . Cancer Sister     Social History Social History  Substance Use Topics  . Smoking status: Never Smoker  . Smokeless tobacco: Never Used  . Alcohol use No     Allergies   Review of patient's allergies indicates no known allergies.   Review of Systems Review of Systems  Musculoskeletal:       L hip pain   All other systems reviewed and are negative.    Physical Exam Updated Vital Signs BP (!) 178/91 (BP Location: Right Arm)    Pulse 80   Temp 97.4 F (36.3 C) (Oral)   Resp 17   Ht 5\' 3"  (1.6 m)   Wt 96 lb (43.5 kg)   SpO2 95%   BMI 17.01 kg/m   Physical Exam  Constitutional: She is oriented to person, place, and time.  Well appearing for age   HENT:  Head: Normocephalic and atraumatic.  No obvious scalp hematoma or abrasions   Eyes: EOM are normal. Pupils are equal, round, and reactive to light.  Neck: Normal range of motion. Neck supple.  Cardiovascular: Normal rate, regular rhythm and normal heart sounds.   Pulmonary/Chest: Effort normal and breath sounds normal.  Abdominal: Soft. Bowel sounds are normal.  Musculoskeletal:  L hip dec ROM, mild tenderness on pelvis. No tenderness in femur or knee. No spinal tenderness   Neurological: She is alert and oriented to person, place, and time.  Skin: Skin is warm.  Psychiatric: She has a normal mood and affect.  Nursing note and vitals reviewed.    ED Treatments / Results  Labs (all labs ordered are listed, but only abnormal results are displayed) Labs Reviewed  CBC WITH DIFFERENTIAL/PLATELET - Abnormal; Notable for the following:       Result Value   WBC 12.8 (*)    Hemoglobin 11.4 (*)    HCT 34.5 (*)    RDW 15.2 (*)    Neutro Abs 10.1 (*)    All other components within normal limits  COMPREHENSIVE METABOLIC PANEL - Abnormal; Notable for the following:    Sodium 131 (*)    Chloride 100 (*)    Glucose, Bld 108 (*)    Calcium 8.8 (*)    All other components within normal limits    EKG  EKG Interpretation None       Radiology Dg Chest 2 View  Result Date: 07/29/2016 CLINICAL DATA:  Fall. EXAM: CHEST  2 VIEW COMPARISON:  None. FINDINGS: The heart is mildly enlarged. Interstitial coarsening is chronic. Emphysematous changes are again noted. There is no edema or effusion to suggest failure. Atherosclerotic changes are noted in the thoracic aorta. Remote right-sided rib fracture is evident. There may be a new right-side rib fracture as  well. More likely, this was present previously but not as well seen. IMPRESSION: 1. Cardiomegaly without failure. 2. Emphysema. 3. Atherosclerosis of the thoracic aorta. 4. Right-sided rib fractures are likely remote. Please correlate with acute tenderness in the right chest. Electronically Signed   By: San Morelle M.D.   On: 07/29/2016 13:56   Ct Head Wo Contrast  Result Date: 07/29/2016 CLINICAL DATA:  [place] getting out of a chair, struck head on cabinet EXAM: CT HEAD WITHOUT CONTRAST TECHNIQUE: Contiguous axial images were obtained from  the base of the skull through the vertex without intravenous contrast. COMPARISON:  12/16/2015 FINDINGS: LEFT orbital prosthesis. Generalized atrophy. Normal ventricular morphology. No midline shift or mass effect. Small vessel chronic ischemic changes of deep cerebral white matter. No intracranial hemorrhage, mass lesion, or evidence of acute infarction. No extra-axial fluid collections. Chronic opacification of RIGHT maxillary sinus, RIGHT mastoid air cells, and RIGHT frontal sinus. Atherosclerotic calcification of internal carotid arteries at carotid siphons. Skull intact. IMPRESSION: Atrophy with small vessel chronic ischemic changes of deep cerebral white matter. No acute intracranial abnormalities. Chronic RIGHT side sinus disease changes as above. Electronically Signed   By: Lavonia Dana M.D.   On: 07/29/2016 16:30   Ct Pelvis Wo Contrast  Result Date: 07/29/2016 CLINICAL DATA:  Fall.  Left hip pain. EXAM: CT PELVIS WITHOUT CONTRAST TECHNIQUE: Multidetector CT imaging of the pelvis was performed following the standard protocol without intravenous contrast. COMPARISON:  Plain films earlier today. FINDINGS: There is a comminuted fracture noted through the left acetabulum, extending superiorly into the left iliac bone and also into the left superior acetabulum. Probable old superior and inferior acetabular fractures on the left. Hardware noted within the  proximal left femur from prior injury and surgery. Degenerative changes in the lower lumbar spine. Grade 1 anterolisthesis of L4 on L5. IMPRESSION: Displaced comminuted fracture through the left acetabulum involving the left superior pubic ramus and left iliac bone. Electronically Signed   By: Rolm Baptise M.D.   On: 07/29/2016 16:33   Dg Hip Unilat W Or Wo Pelvis 2-3 Views Left  Result Date: 07/29/2016 CLINICAL DATA:  Weakness.  Fell with pain in the posterior left hip. EXAM: DG HIP (WITH OR WITHOUT PELVIS) 2-3V LEFT COMPARISON:  09/13/2014 FINDINGS: Again noted is a dynamic hip screw in the proximal left femur. Old fractures involving the left superior and inferior pubic rami. Patient has developed significant left acetabular protrusio since the previous examination. The left hip appears to be located. Right pubic rami appear to be intact. No gross abnormality to the right hip joint. 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 No acute bone abnormality identified. Electronically Signed   By: Markus Daft M.D.   On: 07/29/2016 13:54    Procedures Procedures (including critical care time)  Medications Ordered in ED Medications  oxyCODONE-acetaminophen (PERCOCET/ROXICET) 5-325 MG per tablet 1 tablet (1 tablet Oral Given 07/29/16 1218)  morphine 4 MG/ML injection 4 mg (4 mg Intravenous Given 07/29/16 1636)     Initial Impression / Assessment and Plan / ED Course  I have reviewed the triage vital signs and the nursing notes.  Pertinent labs & imaging results that were available during my care of the patient were reviewed by me and considered in my medical decision making (see chart for details).  Clinical Course   [name] is a 82 y.o. female here with fall. Mechanical fall on to the left hip. Consider pelvic vs hip fracture. Will get xrays.   4:52 PM Xray showed old pelvic fractures. Given pain meds but patient still has pain. CT pelvis  performed and there is acute acetabular fracture. Still can't bear weight. Consulted Dr. Rudene Christians from ortho, who recommend pain control, possible rehab, no surgery given age. Hospitalist to admit.   Final Clinical Impressions(s) / ED Diagnoses   Final diagnoses:  None    New Prescriptions New Prescriptions   No medications on file     Drenda Freeze, MD 07/29/16 1701

## 2016-07-29 NOTE — Progress Notes (Signed)
Anticoagulation monitoring(Lovenox):  82yo  F ordered Lovenox 40 mg Q24h  Filed Weights   07/29/16 1213  Weight: 96 lb (43.5 kg)   BMI 17   Lab Results  Component Value Date   CREATININE 0.55 07/29/2016   CREATININE 0.69 05/20/2016   CREATININE 0.50 01/04/2016   Estimated Creatinine Clearance: 25.7 mL/min (by C-G formula based on SCr of 0.8 mg/dL). Hemoglobin & Hematocrit     Component Value Date/Time   HGB 11.4 (L) 07/29/2016 1348   HGB 14.2 09/25/2014 1304   HCT 34.5 (L) 07/29/2016 1348   HCT 39.0 05/20/2016 1419     Per Protocol for Patient with estCrcl < 30 ml/min and BMI < 40, will transition to Lovenox 30 mg Q24h.      Chinita Greenland PharmD Clinical Pharmacist 07/29/2016

## 2016-07-29 NOTE — H&P (Signed)
[facility] [place] NAME: [name]    MR#:  [id number]  DATE OF BIRTH:  1914-05-07  DATE OF ADMISSION:  07/29/2016  PRIMARY CARE PHYSICIAN: Wilhemena Durie, MD   REQUESTING/REFERRING PHYSICIAN: Drenda Freeze, MD  CHIEF COMPLAINT:   fall HISTORY OF PRESENT ILLNESS:  [name]  is a 82 y.o. female with a known history of [name] is 82 year old female with history of hypertension and peptic ulcer disease is presenting to the ED with a chief complaint of hip pain after she sustained a fall. X-rays has revealed left hip fracture. Patient was reporting severe left hip pain. Patient is living in assisted living facility Son and daughter-in-law are at bedside   PAST MEDICAL HISTORY:   Past Medical History:  Diagnosis Date  . Eye infection   . Falls   . Pelvic fracture (HCC)   Hypertension Hyponatremia  PAST SURGICAL HISTOIRY:   Past Surgical History:  Procedure Laterality Date  . ABDOMINAL HYSTERECTOMY    . CESAREAN SECTION      SOCIAL HISTORY:   Social History  Substance Use Topics  . Smoking status: Never Smoker  . Smokeless tobacco: Never Used  . Alcohol use No    FAMILY HISTORY:   Family History  Problem Relation Age of Onset  . Cancer Sister   . Cancer Sister     DRUG ALLERGIES:  No Known Allergies  REVIEW OF SYSTEMS:  CONSTITUTIONAL: No fever, fatigue or weakness.  EYES: No blurred or double vision.  EARS, NOSE, AND THROAT: No tinnitus or ear pain.  RESPIRATORY: No cough, shortness of breath, wheezing or hemoptysis.  CARDIOVASCULAR: No chest pain, orthopnea, edema.  GASTROINTESTINAL: No nausea, vomiting, diarrhea or abdominal pain.  GENITOURINARY: No dysuria, hematuria.  ENDOCRINE: No polyuria, nocturia,  HEMATOLOGY: No anemia, easy bruising or bleeding SKIN: No rash or lesion. MUSCULOSKELETAL:Reporting left hip pain  NEUROLOGIC: No tingling, numbness, weakness.  PSYCHIATRY:  No anxiety or depression.   MEDICATIONS AT HOME:   Prior to Admission medications   Medication Sig Start Date End Date Taking? Authorizing Provider  levobunolol (BETAGAN) 0.5 % ophthalmic solution Place 1 drop into both eyes 2 (two) times daily.    Yes Historical Provider, MD  levothyroxine (SYNTHROID, LEVOTHROID) 50 MCG tablet Take 1 tablet (50 mcg total) by mouth daily. 06/17/15  Yes Margarita Rana, MD  metoprolol tartrate (LOPRESSOR) 25 MG tablet Take 2 tablets (50 mg total) by mouth daily. Patient taking differently: Take 25 mg by mouth daily.  06/17/15  Yes Margarita Rana, MD  pantoprazole (PROTONIX) 40 MG tablet TAKE 1 TABLET TWICE A DAY 05/06/16  Yes Birdie Sons, MD  ramipril (ALTACE) 5 MG capsule Take 1 capsule (5 mg total) by mouth daily. 06/17/15  Yes Margarita Rana, MD  amLODipine (NORVASC) 10 MG tablet Take 1 tablet (10 mg total) by mouth daily. Patient not taking: Reported on 07/29/2016 12/20/15   Vaughan Basta, MD  feeding supplement, ENSURE ENLIVE, (ENSURE ENLIVE) LIQD Take 237 mLs by mouth 3 (three) times daily with meals. Patient not taking: Reported on 07/29/2016 12/18/15   Aldean Jewett, MD  oxyCODONE-acetaminophen (PERCOCET/ROXICET) 5-325 MG tablet Take 1 tablet by mouth every 8 (eight) hours as needed for moderate pain or severe pain. Patient not taking: Reported on 07/29/2016 12/20/15   Vaughan Basta, MD  tolvaptan (SAMSCA) 15 MG TABS tablet Take 1 tablet (15 mg total) by mouth daily. Patient not taking: Reported on 07/29/2016 12/20/15  Vaughan Basta, MD      VITAL SIGNS:  Blood pressure (!) 178/91, pulse 80, temperature 97.4 F (36.3 C), temperature source Oral, resp. rate 17, height 5\' 3"  (1.6 m), weight 43.5 kg (96 lb), SpO2 95 %.  PHYSICAL EXAMINATION:  GENERAL:  82 y.o.-year-old patient lying in the bed with no acute distress.  EYES: Pupils equal, round, reactive to light and accommodation. No scleral icterus. Extraocular muscles intact.  HEENT:  Head atraumatic, normocephalic. Oropharynx and nasopharynx clear.  NECK:  Supple, no jugular venous distention. No thyroid enlargement, no tenderness.  LUNGS: Normal breath sounds bilaterally, no wheezing, rales,rhonchi or crepitation. No use of accessory muscles of respiration.  CARDIOVASCULAR: S1, S2 normal. No murmurs, rubs, or gallops.  ABDOMEN: Soft, nontender, nondistended. Bowel sounds present. No organomegaly or mass.  EXTREMITIES:Left hip is abducted and externally rotated, tender. No pedal edema, cyanosis, or clubbing.  NEUROLOGIC: Cranial nerves II through XII are intact. Muscle strength is at her baseline in all extremities  Except left lower extremity.Sensation intact. Gait not checked.  PSYCHIATRIC: The patient is alert and oriented x 3.  SKIN: No obvious rash, lesion, or ulcer.   LABORATORY PANEL:   CBC  Recent Labs Lab 07/29/16 1348  WBC 12.8*  HGB 11.4*  HCT 34.5*  PLT 229   ------------------------------------------------------------------------------------------------------------------  Chemistries   Recent Labs Lab 07/29/16 1348  NA 131*  K 4.0  CL 100*  CO2 26  GLUCOSE 108*  BUN 19  CREATININE 0.55  CALCIUM 8.8*  AST 29  ALT 20  ALKPHOS 88  BILITOT 0.6   ------------------------------------------------------------------------------------------------------------------  Cardiac Enzymes No results for input(s): TROPONINI in the last 168 hours. ------------------------------------------------------------------------------------------------------------------  RADIOLOGY:  Dg Chest 2 View  Result Date: 07/29/2016 CLINICAL DATA:  Fall. EXAM: CHEST  2 VIEW COMPARISON:  None. FINDINGS: The heart is mildly enlarged. Interstitial coarsening is chronic. Emphysematous changes are again noted. There is no edema or effusion to suggest failure. Atherosclerotic changes are noted in the thoracic aorta. Remote right-sided rib fracture is evident. There may be a new  right-side rib fracture as well. More likely, this was present previously but not as well seen. IMPRESSION: 1. Cardiomegaly without failure. 2. Emphysema. 3. Atherosclerosis of the thoracic aorta. 4. Right-sided rib fractures are likely remote. Please correlate with acute tenderness in the right chest. Electronically Signed   By: San Morelle M.D.   On: 07/29/2016 13:56   Ct Head Wo Contrast  Result Date: 07/29/2016 CLINICAL DATA:  [place] getting out of a chair, struck head on cabinet EXAM: CT HEAD WITHOUT CONTRAST TECHNIQUE: Contiguous axial images were obtained from the base of the skull through the vertex without intravenous contrast. COMPARISON:  12/16/2015 FINDINGS: LEFT orbital prosthesis. Generalized atrophy. Normal ventricular morphology. No midline shift or mass effect. Small vessel chronic ischemic changes of deep cerebral white matter. No intracranial hemorrhage, mass lesion, or evidence of acute infarction. No extra-axial fluid collections. Chronic opacification of RIGHT maxillary sinus, RIGHT mastoid air cells, and RIGHT frontal sinus. Atherosclerotic calcification of internal carotid arteries at carotid siphons. Skull intact. IMPRESSION: Atrophy with small vessel chronic ischemic changes of deep cerebral white matter. No acute intracranial abnormalities. Chronic RIGHT side sinus disease changes as above. Electronically Signed   By: Lavonia Dana M.D.   On: 07/29/2016 16:30   Ct Pelvis Wo Contrast  Result Date: 07/29/2016 CLINICAL DATA:  Fall.  Left hip pain. EXAM: CT PELVIS WITHOUT CONTRAST TECHNIQUE: Multidetector CT imaging of the pelvis was performed following the standard  protocol without intravenous contrast. COMPARISON:  Plain films earlier today. FINDINGS: There is a comminuted fracture noted through the left acetabulum, extending superiorly into the left iliac bone and also into the left superior acetabulum. Probable old superior and inferior acetabular fractures on the left.  Hardware noted within the proximal left femur from prior injury and surgery. Degenerative changes in the lower lumbar spine. Grade 1 anterolisthesis of L4 on L5. IMPRESSION: Displaced comminuted fracture through the left acetabulum involving the left superior pubic ramus and left iliac bone. Electronically Signed   By: Rolm Baptise M.D.   On: 07/29/2016 16:33   Dg Hip Unilat W Or Wo Pelvis 2-3 Views Left  Addendum Date: 07/29/2016   ADDENDUM REPORT: 07/29/2016 17:21 ADDENDUM: After review of the pelvic CT, the left acetabular protrusio is associated with a comminuted left acetabular fracture. This is difficult to appreciate on the radiographs due to the osteopenia and old traumatic changes. In addition, there is a fracture of the left ilium. Refer to the pelvic CT dated 07/29/2016. Electronically Signed   By: Markus Daft M.D.   On: 07/29/2016 17:21   Result Date: 07/29/2016 CLINICAL DATA:  Weakness.  Fell with pain in the posterior left hip. EXAM: DG HIP (WITH OR WITHOUT PELVIS) 2-3V LEFT COMPARISON:  09/13/2014 FINDINGS: Again noted is a dynamic hip screw in the proximal left femur. Old fractures involving the left superior and inferior pubic rami. Patient has developed significant left acetabular protrusio since the previous examination. The left hip appears to be located. Right pubic rami appear to be intact. No gross abnormality to the right hip joint. 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 No acute bone abnormality identified. Electronically Signed: By: Markus Daft M.D. On: 07/29/2016 13:54    EKG:   Orders placed or performed in visit on 09/13/14  . EKG 12-Lead    IMPRESSION AND PLAN:   [name] is 82 year old female with history of hypertension and peptic ulcer disease is presenting to the ED with a chief complaint of hip pain after she sustained a fall. X-rays has revealed left hip fracture. Patient was reporting severe left hip  pain.  #Left hip fracture CT pelvis has revealed displaced comminuted fracture through left acetabulum involving the left superior pubic ramus and left iliac bone Admit to MedSurg unit Pain management as needed Consult orthopedics Dr. Rudene Christians, not considering surgery as per ED physician's report PT consult-PT evaluation after orthopedic evaluation  #Hypertensive urgency-could be from pain [place] blood pressure is at 199 during my examination Provide pain management as needed Lopressor IV as needed for elevated  blood pressure  Continue home medications, altace dose increased. Continue Norvasc and Lopressor  #Hyponatremia Could not continue her home medications per HOSPITAL CRITERIA Monitor sodium levels closely. Patient is mentating fine.  #Hypothyroidism continue Synthroid  #History of peptic ulcer disease continue PPI   DVT prophylaxis with Lovenox as Dr. Rudene Christians is not considering surgery   All the records are reviewed and case discussed with ED provider. Management plans discussed with the patient, family and they are in agreement.  CODE STATUS: DO NOT RESUSCITATE, son is the healthcare power of attorney  TOTAL TIME TAKING CARE OF THIS PATIENT: 45 minutes.   Note: This dictation was prepared with Dragon dictation along with smaller phrase technology. Any transcriptional errors that result from this process are unintentional.  Nicholes Mango M.D on 07/29/2016 at 6:27 PM  Between 7am to 6pm - Pager -  [phone]  After 6pm go to [email or web address] - password [email or web address] [facility]  Office  [phone]  CC: Primary care physician; Wilhemena Durie, MD

## 2016-07-29 NOTE — ED Notes (Signed)
Patient transported to X-ray 

## 2016-07-29 NOTE — ED Notes (Signed)
Family going to floor with patient. Floor RN made aware.

## 2016-07-30 LAB — BASIC METABOLIC PANEL
Anion gap: 7 (ref 5–15)
BUN: 19 mg/dL (ref 6–20)
CO2: 24 mmol/L (ref 22–32)
CREATININE: 0.6 mg/dL (ref 0.44–1.00)
Calcium: 8.3 mg/dL — ABNORMAL LOW (ref 8.9–10.3)
Chloride: 101 mmol/L (ref 101–111)
GFR calc Af Amer: >60 mL/min (ref >60)
GLUCOSE: 113 mg/dL — AB (ref 65–99)
POTASSIUM: 3.7 mmol/L (ref 3.5–5.1)
Sodium: 132 mmol/L — ABNORMAL LOW (ref 135–145)

## 2016-07-30 LAB — CBC
HEMATOCRIT: 32.6 % — AB (ref 35.0–47.0)
Hemoglobin: 11.1 g/dL — ABNORMAL LOW (ref 12.0–16.0)
MCH: 27.9 pg (ref 26.0–34.0)
MCHC: 34.1 g/dL (ref 32.0–36.0)
MCV: 81.9 fL (ref 80.0–100.0)
PLATELETS: 169 10*3/uL (ref 150–440)
RBC: 3.98 MIL/uL (ref 3.80–5.20)
RDW: 15.1 % — AB (ref 11.5–14.5)
WBC: 8.1 10*3/uL (ref 3.6–11.0)

## 2016-07-30 NOTE — Progress Notes (Signed)
[facility] [place] NAME: [name]    MR#:  [id number]  DATE OF BIRTH:  05/30/1914  SUBJECTIVE:  CHIEF COMPLAINT:   Chief Complaint  Patient presents with  . Fall  . Hip Pain   Continues to have left hip pain.  REVIEW OF SYSTEMS:    Review of Systems  Constitutional: Positive for malaise/fatigue. Negative for chills and fever.  HENT: Negative for sore throat.   Eyes: Negative for blurred vision, double vision and pain.  Respiratory: Negative for cough, hemoptysis, shortness of breath and wheezing.   Cardiovascular: Negative for chest pain, palpitations, orthopnea and leg swelling.  Gastrointestinal: Negative for abdominal pain, constipation, diarrhea, heartburn, nausea and vomiting.  Genitourinary: Negative for dysuria and hematuria.  Musculoskeletal: Positive for back pain and joint pain.  Skin: Negative for rash.  Neurological: Positive for weakness. Negative for sensory change, speech change, focal weakness and headaches.  Endo/Heme/Allergies: Does not bruise/bleed easily.  Psychiatric/Behavioral: Negative for depression. The patient is not nervous/anxious.     DRUG ALLERGIES:  No Known Allergies  VITALS:  Blood pressure (!) 143/63, pulse 66, temperature 98 F (36.7 C), temperature source Oral, resp. rate 16, height 5\' 3"  (1.6 m), weight 50.1 kg (110 lb 6.4 oz), SpO2 97 %.  PHYSICAL EXAMINATION:   Physical Exam  GENERAL:  82 y.o.-year-old patient lying in the bed with no acute distress.  EYES: Pupils equal, round, reactive to light and accommodation. No scleral icterus. Extraocular muscles intact.  HEENT: Head atraumatic, normocephalic. Oropharynx and nasopharynx clear.  NECK:  Supple, no jugular venous distention. No thyroid enlargement, no tenderness.  LUNGS: Normal breath sounds bilaterally, no wheezing, rales, rhonchi. No use of accessory muscles of respiration.  CARDIOVASCULAR: S1, S2 normal. No murmurs,  rubs, or gallops.  ABDOMEN: Soft, nontender, nondistended. Bowel sounds present. No organomegaly or mass.  EXTREMITIES: No cyanosis, clubbing or edema b/l.   Pain with movement of bilateral lower extremities NEUROLOGIC: Cranial nerves II through XII are intact. No focal Motor or sensory deficits b/l.   PSYCHIATRIC: The patient is alert and oriented x 3.  SKIN: No obvious rash, lesion, or ulcer.   LABORATORY PANEL:   CBC  Recent Labs Lab 07/30/16 0454  WBC 8.1  HGB 11.1*  HCT 32.6*  PLT 169   ------------------------------------------------------------------------------------------------------------------ Chemistries   Recent Labs Lab 07/29/16 1348 07/30/16 0454  NA 131* 132*  K 4.0 3.7  CL 100* 101  CO2 26 24  GLUCOSE 108* 113*  BUN 19 19  CREATININE 0.55 0.60  CALCIUM 8.8* 8.3*  AST 29  --   ALT 20  --   ALKPHOS 88  --   BILITOT 0.6  --    ------------------------------------------------------------------------------------------------------------------  Cardiac Enzymes No results for input(s): TROPONINI in the last 168 hours. ------------------------------------------------------------------------------------------------------------------  RADIOLOGY:  Dg Chest 2 View  Result Date: 07/29/2016 CLINICAL DATA:  Fall. EXAM: CHEST  2 VIEW COMPARISON:  None. FINDINGS: The heart is mildly enlarged. Interstitial coarsening is chronic. Emphysematous changes are again noted. There is no edema or effusion to suggest failure. Atherosclerotic changes are noted in the thoracic aorta. Remote right-sided rib fracture is evident. There may be a new right-side rib fracture as well. More likely, this was present previously but not as well seen. IMPRESSION: 1. Cardiomegaly without failure. 2. Emphysema. 3. Atherosclerosis of the thoracic aorta. 4. Right-sided rib fractures are likely remote. Please correlate with acute tenderness in the right chest. Electronically Signed  By: San Morelle M.D.   On: 07/29/2016 13:56   Ct Head Wo Contrast  Result Date: 07/29/2016 CLINICAL DATA:  [place] getting out of a chair, struck head on cabinet EXAM: CT HEAD WITHOUT CONTRAST TECHNIQUE: Contiguous axial images were obtained from the base of the skull through the vertex without intravenous contrast. COMPARISON:  12/16/2015 FINDINGS: LEFT orbital prosthesis. Generalized atrophy. Normal ventricular morphology. No midline shift or mass effect. Small vessel chronic ischemic changes of deep cerebral white matter. No intracranial hemorrhage, mass lesion, or evidence of acute infarction. No extra-axial fluid collections. Chronic opacification of RIGHT maxillary sinus, RIGHT mastoid air cells, and RIGHT frontal sinus. Atherosclerotic calcification of internal carotid arteries at carotid siphons. Skull intact. IMPRESSION: Atrophy with small vessel chronic ischemic changes of deep cerebral white matter. No acute intracranial abnormalities. Chronic RIGHT side sinus disease changes as above. Electronically Signed   By: Lavonia Dana M.D.   On: 07/29/2016 16:30   Ct Pelvis Wo Contrast  Result Date: 07/29/2016 CLINICAL DATA:  Fall.  Left hip pain. EXAM: CT PELVIS WITHOUT CONTRAST TECHNIQUE: Multidetector CT imaging of the pelvis was performed following the standard protocol without intravenous contrast. COMPARISON:  Plain films earlier today. FINDINGS: There is a comminuted fracture noted through the left acetabulum, extending superiorly into the left iliac bone and also into the left superior acetabulum. Probable old superior and inferior acetabular fractures on the left. Hardware noted within the proximal left femur from prior injury and surgery. Degenerative changes in the lower lumbar spine. Grade 1 anterolisthesis of L4 on L5. IMPRESSION: Displaced comminuted fracture through the left acetabulum involving the left superior pubic ramus and left iliac bone. Electronically Signed   By: Rolm Baptise M.D.   On:  07/29/2016 16:33   Dg Hip Unilat W Or Wo Pelvis 2-3 Views Left  Addendum Date: 07/29/2016   ADDENDUM REPORT: 07/29/2016 17:21 ADDENDUM: After review of the pelvic CT, the left acetabular protrusio is associated with a comminuted left acetabular fracture. This is difficult to appreciate on the radiographs due to the osteopenia and old traumatic changes. In addition, there is a fracture of the left ilium. Refer to the pelvic CT dated 07/29/2016. Electronically Signed   By: Markus Daft M.D.   On: 07/29/2016 17:21   Result Date: 07/29/2016 CLINICAL DATA:  Weakness.  Fell with pain in the posterior left hip. EXAM: DG HIP (WITH OR WITHOUT PELVIS) 2-3V LEFT COMPARISON:  09/13/2014 FINDINGS: Again noted is a dynamic hip screw in the proximal left femur. Old fractures involving the left superior and inferior pubic rami. Patient has developed significant left acetabular protrusio since the previous examination. The left hip appears to be located. Right pubic rami appear to be intact. No gross abnormality to the right hip joint. 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 No acute bone abnormality identified. Electronically Signed: By: Markus Daft M.D. On: 07/29/2016 13:54   ASSESSMENT AND PLAN:   Ms. Andree Moro is 82 year old female with history of hypertension and peptic ulcer disease is presenting to the ED with a chief complaint of hip pain after she sustained a fall. X-rays have revealed left pelvic fracture. Patient was reporting severe left hip pain.  #Left hip acetabular fracture Pain management as needed No surgery as per Dr. Rudene Christians of orthopedics PT has been consulted. Partial weightbearing. Will need skilled nursing facility at discharge  #Hypertensive urgency-could be from pain Improved On home medications  #Hyponatremia Chronic and stable  #  Hypothyroidism continue Synthroid  #History of peptic ulcer disease continue PPI   # DVT  prophylaxis with Lovenox  All the records are reviewed and case discussed with Care Management/Social Workerr. Management plans discussed with the patient, family and they are in agreement.  CODE STATUS: DNR  DVT Prophylaxis: SCDs  TOTAL TIME TAKING CARE OF THIS PATIENT: 30 minutes.   POSSIBLE D/C IN 1-2 DAYS, DEPENDING ON CLINICAL CONDITION.  Hillary Bow R M.D on 07/30/2016 at 11:38 AM  Between 7am to 6pm - Pager - [phone]  After 6pm go to [email or web address] - password [email or web address] [facility]  Office  [phone]  CC: Primary care physician; Wilhemena Durie, MD  Note: This dictation was prepared with Dragon dictation along with smaller phrase technology. Any transcriptional errors that result from this process are unintentional.

## 2016-07-30 NOTE — Clinical Social Work Note (Signed)
Clinical Social Work Assessment  Patient Details  Name: Eldonna Neuenfeldt MRN: [id number] Date of Birth: July 07, 1914  Date of referral:  07/30/16               Reason for consult:  Facility Placement                Permission sought to share information with:  Chartered certified accountant granted to share information::  Yes, Verbal Permission Granted  Name::      [place]::   [facility]   Relationship::     Contact Information:     Housing/Transportation Living arrangements for the past 2 months:  [place] of Information:  Patient, Adult Children Patient Interpreter Needed:  None Criminal Activity/Legal Involvement Pertinent to Current Situation/Hospitalization:  No - Comment as needed Significant Relationships:  Adult Children Lives with:  Self Do you feel safe going back to the place where you live?  Yes Need for family participation in patient care:  Yes (Comment)  Care giving concerns:  Patient lives at [facility].     Social Worker assessment / plan:  Holiday representative (CSW) received verbal consult from PT that recommendation is SNF. CSW met with patient and her daughter in law was at bedside. CSW introduced self and explained role of CSW department. Patient was alert and oriented and was laying in the bed. CSW is familiar with patient from previous admissions. Per patient she lives at [place] alone. Patient's son [name] and daughter in law are primary support. CSW explained SNF process and that Medicare will pay for SNF if patient has had a 3 night inpatient qualifying stay at a hospital. CSW explained that patient could change to observation because she is a pelvic fracture and no surgical intervention. Patient is agreeable to SNF search and prefers [facility]. Patient asked CSW to call her son [name].   CSW contacted patient's son [name] and made him aware of above. [name]  reported that patient has been observation before and had to pay out of pocket for rehab at [facility]. Son reported that patient can pay out of pocket if needed for STR. CSW discussed long term care with son. Per son patient can pay privately for long term care. Son prefers [facility] or [facility]. FL2 complete and faxed out. CSW will continue to follow and assist as needed.    Employment status:  Retired Forensic scientist:  Medicare PT Recommendations:  [place] / Referral to community resources:  [place]  Patient/Family's Response to care:  Patient and son are agreeable to [facility].   Patient/Family's Understanding of and Emotional Response to Diagnosis, Current Treatment, and Prognosis:  Son expressed frustration that patient may change to observation. CSW provided support and education on Inpatient VS. Observation.   Emotional Assessment Appearance:  Appears stated age Attitude/Demeanor/Rapport:    Affect (typically observed):  Accepting, Adaptable, Pleasant Orientation:  Oriented to Self, Oriented to Place, Oriented to  Time, Oriented to Situation Alcohol / Substance use:  Not Applicable Psych involvement (Current and /or in the community):  No (Comment)  Discharge Needs  Concerns to be addressed:  Discharge Planning Concerns Readmission within the last 30 days:  No Current discharge risk:  Dependent with Mobility Barriers to Discharge:  Continued Medical Work up   [employer], Veronia Beets, LCSW 07/30/2016, 3:49 PM

## 2016-07-30 NOTE — Consult Note (Signed)
Patient is 82 year old white female with a history of prior pelvic fracture, had been previously ambulator with cane and has had use a walker since her prior pelvic fracture. She also reports difficulty with balance. She suffered a fall yesterday under her left side is brought to the emergency room and found to have a left pelvic acetabular fracture. She is unable to bear weight on the leg secondary to severe pain.  Examination: She is resting in bed comfortably she does have significant pain with logrolling of the left leg with pain in the left groin. She is neurovascular intact and does not have deformity to the left lower extremity.  Radiographic review: She has a medial wall fracture of the acetabulum that is potential for instability and prior needs to remain partial weightbearing until this heals.   Impression is left acetabular fracture with potential for unstable ankle fracture and the fracture moved she really would not be able to walk again.  Plan is physical therapy partial weightbearing left lower extremity, expect it'll be about 6 weeks before she can be full weightbearing she will require rehabilitation stay

## 2016-07-30 NOTE — Progress Notes (Signed)
Pt with some pain 8/10. Pt. Agreed to percocet this morning. Pain medication effective.

## 2016-07-30 NOTE — NC FL2 (Signed)
[place] LEVEL OF CARE SCREENING TOOL     IDENTIFICATION  Patient Name: [name] Birthdate: Dec 19, 1913 Sex: female Admission Date (Current Location): 07/29/2016  [place] and [place] Number:  Engineering geologist and Address:  [facility], [address], [place], [place] [address]      Provider Number: [id number]  Attending Physician Name and Address:  Hillary Bow, MD  Relative Name and Phone Number:       Current Level of Care: Hospital Recommended Level of Care: [place] Prior Approval Number:    Date Approved/Denied:   PASRR Number:  ( [id number] )  Discharge Plan: SNF    Current Diagnoses: Patient Active Problem List   Diagnosis Date Noted  . Pelvic fracture, closed, initial encounter 07/29/2016  . Edema leg 06/17/2016  . Acid reflux 06/17/2016  . Bleeding gastrointestinal 06/17/2016  . BP (high blood pressure) 06/17/2016  . Adult hypothyroidism 06/17/2016  . Colitis, ischemic ([place]) 06/17/2016  . Osteoarthrosis 06/17/2016  . OP (osteoporosis) 06/17/2016  . Gastroduodenal ulcer 06/17/2016  . Chronic follicular conjunctivitis 03/02/2016  . History of surgical procedure 03/02/2016  . 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 Personal history of healed traumatic fracture 12/21/2015  . Corneal perforation of left eye 12/18/2015  . Hyponatremia 12/16/2015  . Rib fracture 12/14/2015    Orientation RESPIRATION BLADDER Height & Weight     Self, Time, Situation, Place  Normal Continent Weight: 110 lb 6.4 oz (50.1 kg) Height:  5\' 3"  (160 cm)  BEHAVIORAL SYMPTOMS/MOOD NEUROLOGICAL BOWEL NUTRITION STATUS   (none )  (none ) Continent Diet (Diet: 2 Grams Sodium. )  AMBULATORY STATUS COMMUNICATION OF NEEDS Skin   Extensive Assist Verbally Normal                       Personal Care Assistance Level of Assistance  Bathing, Feeding, Dressing Bathing Assistance: Limited assistance Feeding assistance:  Independent Dressing Assistance: Limited assistance     Functional Limitations Info  Sight, Hearing, Speech Sight Info: Adequate Hearing Info: Adequate Speech Info: Adequate    SPECIAL CARE FACTORS FREQUENCY  PT (By licensed PT), OT (By licensed OT)     PT Frequency:  (5) OT Frequency:  (5)            Contractures      Additional Factors Info  Code Status, Allergies Code Status Info:  (DNR ) Allergies Info:  (No Known Allergies. )           Current Medications (07/30/2016):  This is the current hospital active medication list Current Facility-Administered Medications  Medication Dose Route Frequency Provider Last Rate Last Dose  . acetaminophen (TYLENOL) tablet 650 mg  650 mg Oral Q6H PRN Nicholes Mango, MD   650 mg at 07/30/16 0914   Or  . acetaminophen (TYLENOL) suppository 650 mg  650 mg Rectal Q6H PRN Nicholes Mango, MD      . amLODipine (NORVASC) tablet 10 mg  10 mg Oral Daily Nicholes Mango, MD   10 mg at 07/30/16 0914  . docusate sodium (COLACE) capsule 100 mg  100 mg Oral BID Nicholes Mango, MD   100 mg at 07/30/16 0914  . enoxaparin (LOVENOX) injection 30 mg  30 mg Subcutaneous Q24H Nicholes Mango, MD   30 mg at 07/29/16 2133  . feeding supplement (ENSURE ENLIVE) (ENSURE ENLIVE) liquid 237 mL  237 mL Oral TID WC Nicholes Mango, MD   237 mL at 07/30/16 1305  .  levobunolol (BETAGAN) 0.5 % ophthalmic solution 1 drop  1 drop Both Eyes BID Nicholes Mango, MD   1 drop at 07/30/16 1403  . levothyroxine (SYNTHROID, LEVOTHROID) tablet 50 mcg  50 mcg Oral QAC breakfast Nicholes Mango, MD   50 mcg at 07/30/16 0913  . metoprolol (LOPRESSOR) injection 5 mg  5 mg Intravenous Q6H PRN Nicholes Mango, MD      . metoprolol (LOPRESSOR) tablet 50 mg  50 mg Oral Daily Nicholes Mango, MD   50 mg at 07/30/16 0914  . morphine 2 MG/ML injection 2 mg  2 mg Intravenous Q4H PRN Nicholes Mango, MD      . ondansetron (ZOFRAN) tablet 4 mg  4 mg Oral Q6H PRN Nicholes Mango, MD       Or  . ondansetron (ZOFRAN) injection 4 mg  4  mg Intravenous Q6H PRN Aruna Gouru, MD      . oxyCODONE (Oxy IR/ROXICODONE) immediate release tablet 5 mg  5 mg Oral Q4H PRN Nicholes Mango, MD      . oxyCODONE-acetaminophen (PERCOCET/ROXICET) 5-325 MG per tablet 1 tablet  1 tablet Oral Q8H PRN Nicholes Mango, MD   1 tablet at 07/30/16 1304  . pantoprazole (PROTONIX) EC tablet 40 mg  40 mg Oral BID Nicholes Mango, MD   40 mg at 07/30/16 0913  . ramipril (ALTACE) capsule 10 mg  10 mg Oral Daily Nicholes Mango, MD   10 mg at 07/30/16 0913  . sodium chloride flush (NS) 0.9 % injection 3 mL  3 mL Intravenous Q12H Nicholes Mango, MD         Discharge Medications: Please see discharge summary for a list of discharge medications.  Relevant Imaging Results:  Relevant Lab Results:   Additional Information  (SSN: [id number])  Aizley Stenseth, Veronia Beets, LCSW

## 2016-07-30 NOTE — Progress Notes (Signed)
Pt remaining alert and oriented.  Medicated for pain with Tylenol per pt request. Using bed pan to void. Iv infusing without difficulty. Able to sleep in between care.

## 2016-07-30 NOTE — Clinical Social Work Placement (Signed)
   CLINICAL SOCIAL WORK PLACEMENT  NOTE  Date:  07/30/2016  Patient Details  Name: [name] MRN: [id number] Date of Birth: [birth year]  Clinical Social Work is seeking post-discharge placement for this patient at the [place] level of care (*CSW will initial, date and re-position this form in  chart as items are completed):  Yes   Patient/family provided with [facility] Work Department's list of facilities offering this level of care within the geographic area requested by the patient (or if unable, by the patient's family).  Yes   Patient/family informed of their freedom to choose among providers that offer the needed level of care, that participate in Medicare, Medicaid or managed care program needed by the patient, have an available bed and are willing to accept the patient.  Yes   Patient/family informed of [facility]'s ownership interest in [facility] and [facility], as well as of the fact that they are under no obligation to receive care at these facilities.  PASRR submitted to EDS on       PASRR number received on       Existing PASRR number confirmed on 07/30/16     FL2 transmitted to all facilities in geographic area requested by pt/family on 07/30/16     FL2 transmitted to all facilities within larger geographic area on       Patient informed that his/her managed care company has contracts with or will negotiate with certain facilities, including the following:            Patient/family informed of bed offers received.  Patient chooses bed at       Physician recommends and patient chooses bed at      Patient to be transferred to   on  .  Patient to be transferred to facility by       Patient family notified on   of transfer.  Name of family member notified:        PHYSICIAN       Additional Comment:    _______________________________________________ Quinzell Malcomb, Veronia Beets, LCSW 07/30/2016, 3:49 PM

## 2016-07-30 NOTE — Evaluation (Signed)
Physical Therapy Evaluation Patient Details Name: [name] MRN: [id number] DOB: 1913-12-21 Today's Date: 07/30/2016   History of Present Illness  [name] is a 82yo white female who comes to [facility] on 8/[facility] after fall with subsequent hip/going pain: found to have Left acetabular, pubic ramus, and ilium fractures. Ortho consult reports no intended operative intervention. PMH: Left eye prosthesis, multiple falls, balance problems, prior pelvic fractures.   Clinical Impression  Upon entry, the patient is received semirecumbent in bed, son present. The pt is awake and agreeable to participate. No acute distress noted at this time while at rest. She is able to expressing understanding of weight bearing restrictions and prognosis. The pt is alert and oriented x3, pleasant, conversational, and following simple and multi-step commands consistently. Strength screening reveals focal impairment in the LLE, largely due to pain inhibition limiting available AROM. BUE strength is mildly insufficient for indep with transfers. Functional mobility assessment demonstrates moderate weakness, the pt now requiring Mod-max assist physical assistance for bed mobility, transfers, and gait, whereas the patient performs these independently at baseline. All mobility is performed with LLE partial weight bearing restrictions as indicated.    Patient presenting with impairment of strength, range of motion, balance, and activity tolerance, limiting ability to perform ADL and mobility tasks at  baseline level of function. Patient will benefit from skilled intervention to address the above impairments and limitations, in order to restore to prior level of function, improve patient safety upon discharge, and to decrease falls risk.       Follow Up Recommendations SNF    Equipment Recommendations   (Pediatric RW )    Recommendations for Other Services       Precautions / Restrictions Precautions Precautions:  Fall Restrictions Weight Bearing Restrictions: Yes LLE Weight Bearing: Partial weight bearing      Mobility  Bed Mobility Overal bed mobility: Needs Assistance Bed Mobility: Supine to Sit (scooting )     Supine to sit: Max assist     General bed mobility comments: Performs good RLE single leg bridge for lateral scooting in bed.   Transfers Overall transfer level: Needs assistance Equipment used: Rolling walker (2 wheeled);None Transfers: Sit to/from [facility] Sit to Stand: Mod assist Stand pivot transfers: Total assist       General transfer comment: Weight bearing capacity more limited by pain than precautions.   Ambulation/Gait                Stairs            Wheelchair Mobility    Modified Rankin (Stroke Patients Only)       Balance Overall balance assessment: History of Falls;Needs assistance Sitting-balance support: Feet supported;No upper extremity supported Sitting balance-Leahy Scale: Good                                       Pertinent Vitals/Pain Pain Assessment: Faces Faces Pain Scale: Hurts even more Pain Location: Left groin/hip  Pain Intervention(s): Limited activity within patient's tolerance;Monitored during session;Premedicated before session    Home Living Family/patient expects to be discharged to:: [place]: Walker - 2 wheels      Prior Function Level of Independence: Independent with assistive device(s)               Hand Dominance  Extremity/Trunk Assessment   Upper Extremity Assessment: Generalized weakness;Overall WFL for tasks assessed           Lower Extremity Assessment: Generalized weakness         Communication   Communication: No difficulties  Cognition Arousal/Alertness: Awake/alert Behavior During Therapy: WFL for tasks assessed/performed Overall Cognitive Status: Within Functional Limits for  tasks assessed                      General Comments      Exercises        Assessment/Plan    PT Assessment Patient needs continued PT services  PT Diagnosis Difficulty walking;Generalized weakness;Acute pain;Abnormality of gait   PT Problem List Decreased strength;Decreased range of motion;Decreased activity tolerance;Decreased balance;Decreased mobility;Pain  PT Treatment Interventions DME instruction;Gait training;Therapeutic exercise;Balance training;Functional mobility training;Therapeutic activities;Patient/family education   PT Goals (Current goals can be found in the Care Plan section) Acute Rehab PT Goals Patient Stated Goal: Regain strength and independence in mobility, improve balance  PT Goal Formulation: With patient Time For Goal Achievement: 08/13/16 Potential to Achieve Goals: Fair    Frequency 7X/week   Barriers to discharge Inaccessible home environment;Decreased caregiver support      Co-evaluation               End of Session Equipment Utilized During Treatment: Gait belt Activity Tolerance: Patient tolerated treatment well;Patient limited by pain Patient left: in chair;with call bell/phone within reach;with family/visitor present Nurse Communication: Mobility status;Other (comment)    Functional Assessment Tool Used: Clinical Judgment  Functional Limitation: Changing and maintaining body position Changing and Maintaining Body Position Current Status [id number]): At least 80 percent but less than 100 percent impaired, limited or restricted Changing and Maintaining Body Position Goal Status [id number]): At least 60 percent but less than 80 percent impaired, limited or restricted    Time: [id number] PT Time Calculation (min) (ACUTE ONLY): 39 min   Charges:   PT Evaluation $PT Eval Moderate Complexity: 1 Procedure PT Treatments $Therapeutic Activity: 8-22 mins   PT G Codes:   PT G-Codes **NOT FOR INPATIENT CLASS** Functional Assessment Tool  Used: Clinical Judgment  Functional Limitation: Changing and maintaining body position Changing and Maintaining Body Position Current Status [id number]): At least 80 percent but less than 100 percent impaired, limited or restricted Changing and Maintaining Body Position Goal Status [id number]): At least 60 percent but less than 80 percent impaired, limited or restricted    12:34 PM, 07/30/16 Etta Grandchild, PT, DPT Physical Therapist - [place] [phone] (mobile)

## 2016-07-31 NOTE — Progress Notes (Signed)
Physical Therapy Treatment Patient Details Name: [name] MRN: [id number] DOB: 01-01-1914 Today's Date: 07/31/2016    History of Present Illness [name] is a 82yo white female who comes to [facility] on 8/18 after fall with subsequent hip/going pain: found to have Left acetabular, pubic ramus, and ilium fractures. PMH: Left eye prosthesis, multiple falls, balance problems, prior pelvic fractures.     PT Comments    Pt shows good effort despite being clearly in pain, she was able to participate with bed exercises with some difficult and did well with getting to EOB, but with standing/WBing/attempting ambulation she was very pain limited and ultimately was unable to take any real steps w/o excessive assist and near SPT to the recliner.    Follow Up Recommendations  SNF     Equipment Recommendations       Recommendations for Other Services       Precautions / Restrictions Precautions Precautions: Fall Restrictions LLE Weight Bearing: Partial weight bearing (per ortho note)    Mobility  Bed Mobility Overal bed mobility: Needs Assistance Bed Mobility: Supine to Sit     Supine to sit: Max assist     General bed mobility comments: Pt is able to assist with some minimal LE sliding to EOB but is very weak and needs considerble assist to get to sitting  Transfers Overall transfer level: Needs assistance Equipment used: Rolling walker (2 wheeled);None Transfers: Sit to/from Stand Sit to Stand: Mod assist;Max assist Stand pivot transfers: Total assist       General transfer comment: Pt able to shift weight forward, but struggles to get up right and use UEs appropriately.  Ambulation/Gait             General Gait Details: Pt is unable to really do any ambulation/take steps.  She showed good effort in trying to step/move/shuffle feet but was heavily reliant on the walker, showed no ability to take weight through the L LE and ultimately needeed max assist to safely get  to the recliner.    Stairs            Wheelchair Mobility    Modified Rankin (Stroke Patients Only)       Balance                                    Cognition Arousal/Alertness: Awake/alert Behavior During Therapy: WFL for tasks assessed/performed Overall Cognitive Status: Within Functional Limits for tasks assessed                      Exercises General Exercises - Lower Extremity Quad Sets: Strengthening;10 reps Gluteal Sets: Strengthening;10 reps Short Arc Quad: AROM;10 reps Heel Slides: AAROM;AROM;10 reps Hip ABduction/ADduction: AAROM;AROM;10 reps    General Comments        Pertinent Vitals/Pain Pain Location: Pt unable to rate, but c/o severe pain with WBing that is clearly signficant    Home Living                      Prior Function            PT Goals (current goals can now be found in the care plan section) Progress towards PT goals: Progressing toward goals    Frequency  7X/week    PT Plan Current plan remains appropriate    Co-evaluation  End of Session Equipment Utilized During Treatment: Gait belt Activity Tolerance: Patient limited by pain;Patient tolerated treatment well Patient left: in chair;with call bell/phone within reach;with family/visitor present     Time: 0905-0930 PT Time Calculation (min) (ACUTE ONLY): 25 min  Charges:  $Therapeutic Exercise: 8-22 mins $Therapeutic Activity: 8-22 mins                    G Codes:      Kreg Shropshire, DPT 07/31/2016, 12:37 PM

## 2016-07-31 NOTE — Progress Notes (Signed)
[facility] at [place] NAME: [name]    MR#:  [id number]  DATE OF BIRTH:  1914/01/05  SUBJECTIVE:  CHIEF COMPLAINT:   Chief Complaint  Patient presents with  . Fall  . Hip Pain   Continues to have left hip pain.  REVIEW OF SYSTEMS:    Review of Systems  Constitutional: Positive for malaise/fatigue. Negative for chills and fever.  HENT: Negative for sore throat.   Eyes: Negative for blurred vision, double vision and pain.  Respiratory: Negative for cough, hemoptysis, shortness of breath and wheezing.   Cardiovascular: Negative for chest pain, palpitations, orthopnea and leg swelling.  Gastrointestinal: Negative for abdominal pain, constipation, diarrhea, heartburn, nausea and vomiting.  Genitourinary: Negative for dysuria and hematuria.  Musculoskeletal: Positive for back pain and joint pain.  Skin: Negative for rash.  Neurological: Positive for weakness. Negative for sensory change, speech change, focal weakness and headaches.  Endo/Heme/Allergies: Does not bruise/bleed easily.  Psychiatric/Behavioral: Negative for depression. The patient is not nervous/anxious.     DRUG ALLERGIES:  No Known Allergies  VITALS:  Blood pressure (!) 147/77, pulse 75, temperature 98 F (36.7 C), temperature source Oral, resp. rate 18, height 5\' 3"  (1.6 m), weight 50.1 kg (110 lb 6.4 oz), SpO2 95 %.  PHYSICAL EXAMINATION:   Physical Exam  GENERAL:  82 y.o.-year-old patient lying in the bed with no acute distress.  EYES: Pupils equal, round, reactive to light and accommodation. No scleral icterus. Extraocular muscles intact.  HEENT: Head atraumatic, normocephalic. Oropharynx and nasopharynx clear.  NECK:  Supple, no jugular venous distention. No thyroid enlargement, no tenderness.  LUNGS: Normal breath sounds bilaterally, no wheezing, rales, rhonchi. No use of accessory muscles of respiration.  CARDIOVASCULAR: S1, S2 normal. No murmurs,  rubs, or gallops.  ABDOMEN: Soft, nontender, nondistended. Bowel sounds present. No organomegaly or mass.  EXTREMITIES: No cyanosis, clubbing or edema b/l.   Pain with movement of bilateral lower extremities NEUROLOGIC: Cranial nerves II through XII are intact. No focal Motor or sensory deficits b/l.   PSYCHIATRIC: The patient is alert and oriented x 3.  SKIN: No obvious rash, lesion, or ulcer.   LABORATORY PANEL:   CBC  Recent Labs Lab 07/30/16 0454  WBC 8.1  HGB 11.1*  HCT 32.6*  PLT 169   ------------------------------------------------------------------------------------------------------------------ Chemistries   Recent Labs Lab 07/29/16 1348 07/30/16 0454  NA 131* 132*  K 4.0 3.7  CL 100* 101  CO2 26 24  GLUCOSE 108* 113*  BUN 19 19  CREATININE 0.55 0.60  CALCIUM 8.8* 8.3*  AST 29  --   ALT 20  --   ALKPHOS 88  --   BILITOT 0.6  --    ------------------------------------------------------------------------------------------------------------------  Cardiac Enzymes No results for input(s): TROPONINI in the last 168 hours. ------------------------------------------------------------------------------------------------------------------  RADIOLOGY:  Ct Head Wo Contrast  Result Date: 07/29/2016 CLINICAL DATA:  [place] getting out of a chair, struck head on cabinet EXAM: CT HEAD WITHOUT CONTRAST TECHNIQUE: Contiguous axial images were obtained from the base of the skull through the vertex without intravenous contrast. COMPARISON:  12/16/2015 FINDINGS: LEFT orbital prosthesis. Generalized atrophy. Normal ventricular morphology. No midline shift or mass effect. Small vessel chronic ischemic changes of deep cerebral white matter. No intracranial hemorrhage, mass lesion, or evidence of acute infarction. No extra-axial fluid collections. Chronic opacification of RIGHT maxillary sinus, RIGHT mastoid air cells, and RIGHT frontal sinus. Atherosclerotic calcification of internal  carotid arteries at carotid siphons. Skull intact.  IMPRESSION: Atrophy with small vessel chronic ischemic changes of deep cerebral white matter. No acute intracranial abnormalities. Chronic RIGHT side sinus disease changes as above. Electronically Signed   By: Lavonia Dana M.D.   On: 07/29/2016 16:30   Ct Pelvis Wo Contrast  Result Date: 07/29/2016 CLINICAL DATA:  Fall.  Left hip pain. EXAM: CT PELVIS WITHOUT CONTRAST TECHNIQUE: Multidetector CT imaging of the pelvis was performed following the standard protocol without intravenous contrast. COMPARISON:  Plain films earlier today. FINDINGS: There is a comminuted fracture noted through the left acetabulum, extending superiorly into the left iliac bone and also into the left superior acetabulum. Probable old superior and inferior acetabular fractures on the left. Hardware noted within the proximal left femur from prior injury and surgery. Degenerative changes in the lower lumbar spine. Grade 1 anterolisthesis of L4 on L5. IMPRESSION: Displaced comminuted fracture through the left acetabulum involving the left superior pubic ramus and left iliac bone. Electronically Signed   By: Rolm Baptise M.D.   On: 07/29/2016 16:33   ASSESSMENT AND PLAN:   Ms. [name] is 82 year old female with history of hypertension and peptic ulcer disease is presenting to the ED with a chief complaint of hip pain after she sustained a fall. X-rays have revealed left pelvic fracture. Patient was reporting severe left hip pain.  #Left hip acetabular fracture Pain management as needed No surgery as per Dr. Rudene Christians of orthopedics PT has been consulted. Partial weightbearing. Will need skilled nursing facility at discharge  #Hypertension Improved On home medications  # Hyponatremia Chronic and stable  #Hypothyroidism continue Synthroid  #History of peptic ulcer disease continue PPI   # DVT prophylaxis with Lovenox  All the records are reviewed and case discussed with  Care Management/Social Workerr. Management plans discussed with the patient, family and they are in agreement.  CODE STATUS: DNR  DVT Prophylaxis: SCDs  TOTAL TIME TAKING CARE OF THIS PATIENT: 30 minutes.   POSSIBLE D/C IN 1-2 DAYS, DEPENDING ON CLINICAL CONDITION.  Hillary Bow R M.D on 07/31/2016 at 1:32 PM  Between 7am to 6pm - Pager - [phone]  After 6pm go to [email or web address] - password [email or web address] [facility]  Office  [phone]  CC: Primary care physician; Wilhemena Durie, MD  Note: This dictation was prepared with Dragon dictation along with smaller phrase technology. Any transcriptional errors that result from this process are unintentional.

## 2016-08-01 ENCOUNTER — Encounter
Admission: RE | Admit: 2016-08-01 | Discharge: 2016-08-01 | Disposition: A | Discharge status: Home / Self Care | Payer: Medicare Other | Source: Ambulatory Visit | Attending: Internal Medicine | Admitting: Internal Medicine

## 2016-08-01 ENCOUNTER — Encounter: Payer: Self-pay | Admitting: Cardiology

## 2016-08-01 DIAGNOSIS — H409 Unspecified glaucoma: Secondary | ICD-10-CM | POA: Diagnosis not present

## 2016-08-01 DIAGNOSIS — Z9001 Acquired absence of eye: Secondary | ICD-10-CM | POA: Diagnosis not present

## 2016-08-01 DIAGNOSIS — M810 Age-related osteoporosis without current pathological fracture: Secondary | ICD-10-CM | POA: Diagnosis not present

## 2016-08-01 DIAGNOSIS — S058X9A Other injuries of unspecified eye and orbit, initial encounter: Secondary | ICD-10-CM | POA: Diagnosis not present

## 2016-08-01 DIAGNOSIS — S329XXA Fracture of unspecified parts of lumbosacral spine and pelvis, initial encounter for closed fracture: Secondary | ICD-10-CM | POA: Diagnosis not present

## 2016-08-01 DIAGNOSIS — K219 Gastro-esophageal reflux disease without esophagitis: Secondary | ICD-10-CM | POA: Diagnosis not present

## 2016-08-01 DIAGNOSIS — K279 Peptic ulcer, site unspecified, unspecified as acute or chronic, without hemorrhage or perforation: Secondary | ICD-10-CM | POA: Diagnosis not present

## 2016-08-01 DIAGNOSIS — G8918 Other acute postprocedural pain: Secondary | ICD-10-CM | POA: Diagnosis not present

## 2016-08-01 DIAGNOSIS — Z743 Need for continuous supervision: Secondary | ICD-10-CM | POA: Diagnosis not present

## 2016-08-01 DIAGNOSIS — I160 Hypertensive urgency: Secondary | ICD-10-CM | POA: Diagnosis not present

## 2016-08-01 DIAGNOSIS — S32511A Fracture of superior rim of right pubis, initial encounter for closed fracture: Secondary | ICD-10-CM | POA: Diagnosis not present

## 2016-08-01 DIAGNOSIS — I10 Essential (primary) hypertension: Secondary | ICD-10-CM | POA: Diagnosis not present

## 2016-08-01 DIAGNOSIS — M6281 Muscle weakness (generalized): Secondary | ICD-10-CM | POA: Diagnosis not present

## 2016-08-01 DIAGNOSIS — E039 Hypothyroidism, unspecified: Secondary | ICD-10-CM | POA: Diagnosis not present

## 2016-08-01 DIAGNOSIS — R262 Difficulty in walking, not elsewhere classified: Secondary | ICD-10-CM | POA: Diagnosis not present

## 2016-08-01 DIAGNOSIS — Z9181 History of falling: Secondary | ICD-10-CM | POA: Diagnosis not present

## 2016-08-01 DIAGNOSIS — E871 Hypo-osmolality and hyponatremia: Secondary | ICD-10-CM | POA: Diagnosis not present

## 2016-08-01 DIAGNOSIS — H10401 Unspecified chronic conjunctivitis, right eye: Secondary | ICD-10-CM | POA: Diagnosis not present

## 2016-08-01 DIAGNOSIS — S3993XA Unspecified injury of pelvis, initial encounter: Secondary | ICD-10-CM | POA: Diagnosis not present

## 2016-08-01 DIAGNOSIS — S32511D Fracture of superior rim of right pubis, subsequent encounter for fracture with routine healing: Secondary | ICD-10-CM | POA: Diagnosis not present

## 2016-08-01 DIAGNOSIS — K5900 Constipation, unspecified: Secondary | ICD-10-CM | POA: Diagnosis not present

## 2016-08-01 DIAGNOSIS — M80052D Age-related osteoporosis with current pathological fracture, left femur, subsequent encounter for fracture with routine healing: Secondary | ICD-10-CM | POA: Diagnosis not present

## 2016-08-01 DIAGNOSIS — H10501 Unspecified blepharoconjunctivitis, right eye: Secondary | ICD-10-CM | POA: Diagnosis not present

## 2016-08-01 MED ORDER — OXYCODONE-ACETAMINOPHEN 5-325 MG PO TABS: 1.0000 | ORAL_TABLET | Freq: Four times a day (QID) | ORAL | 0 refills | Status: DC | PRN

## 2016-08-01 MED ORDER — DOCUSATE SODIUM 100 MG PO CAPS: 100.0000 mg | ORAL_CAPSULE | Freq: Two times a day (BID) | ORAL | 0 refills | Status: DC

## 2016-08-01 NOTE — Progress Notes (Signed)
Clinical Education officer, museum (CSW) presented bed offers to patient and she chose [facility]. Patient is medically stable for D/C to [facility] today. Per Kim admissions coordinator at [facility] patient will go to room 205. RN will call report at [phone] and arrange EMS for transport. CSW sent D/C orders to [facility] via [facility]. Patient is aware of above. Patient's daughter in law is at bedside and aware of above. CSW contacted patient's son [name] and made him aware of above. Please reconsult if future social work needs arise. CSW signing off.   [employer], LCSW [phone]

## 2016-08-01 NOTE — Discharge Summary (Addendum)
[place] at [place] NAME: [name]    MR#:  [id number]  DATE OF BIRTH:  Apr 25, 1914  DATE OF ADMISSION:  07/29/2016 ADMITTING PHYSICIAN: Nicholes Mango, MD  DATE OF DISCHARGE: 08/01/2016  PRIMARY CARE PHYSICIAN: Wilhemena Durie, MD   ADMISSION DIAGNOSIS:  Acetabular fracture, left, closed, initial encounter [S32.402A]  DISCHARGE DIAGNOSIS:  Active Problems:   Pelvic fracture, closed, initial encounter   SECONDARY DIAGNOSIS:   Past Medical History:  Diagnosis Date  . Eye infection   . Falls   . Pelvic fracture [facility])      ADMITTING HISTORY  [name]  is a 82 y.o. female with a known history of [name] is 82 year old female with history of hypertension and peptic ulcer disease is presenting to the ED with a chief complaint of hip pain after she sustained a fall. X-rays has revealed left hip fracture. Patient was reporting severe left hip pain. Patient is living in assisted living facility Son and daughter-in-law are at [place]:   Ms. [name] is 82 year old female with history of hypertension and peptic ulcer disease is presenting to the ED with a chief complaint of hip pain after she sustained a fall. X-rays have revealed left pelvic fracture. Patient was reporting severe left hip pain on admission  #Left hip acetabular fracture Pain meds as needed No surgery as per Dr. Rudene Christians of orthopedics  Partial weightbearing. Patient working with physical therapy  #Hypertension Improved On home medications  # Hyponatremia Chronic and stable  #Hypothyroidism continue Synthroid  #History of peptic ulcer disease continue PPI   # DVT prophylaxis with Lovenox in hospital  Stable for discharge to SNF for PT  CONSULTS OBTAINED:  Treatment Team:  Hessie Knows, MD  DRUG ALLERGIES:  No Known Allergies  DISCHARGE MEDICATIONS:   Current Discharge Medication List    START taking  these medications   Details  docusate sodium (COLACE) 100 MG capsule Take 1 capsule (100 mg total) by mouth 2 (two) times daily. Qty: 10 capsule, Refills: 0      CONTINUE these medications which have CHANGED   Details  oxyCODONE-acetaminophen (PERCOCET/ROXICET) 5-325 MG tablet Take 1 tablet by mouth every 6 (six) hours as needed for severe pain. Qty: 20 tablet, Refills: 0      CONTINUE these medications which have NOT CHANGED   Details  levobunolol (BETAGAN) 0.5 % ophthalmic solution Place 1 drop into both eyes 2 (two) times daily.     levothyroxine (SYNTHROID, LEVOTHROID) 50 MCG tablet Take 1 tablet (50 mcg total) by mouth daily. Qty: 30 tablet, Refills: 0    metoprolol tartrate (LOPRESSOR) 25 MG tablet Take 2 tablets (50 mg total) by mouth daily. Qty: 30 tablet, Refills: 0    pantoprazole (PROTONIX) 40 MG tablet TAKE 1 TABLET TWICE A DAY Qty: 180 tablet, Refills: 0    ramipril (ALTACE) 5 MG capsule Take 1 capsule (5 mg total) by mouth daily. Qty: 30 capsule, Refills: 0    amLODipine (NORVASC) 10 MG tablet Take 1 tablet (10 mg total) by mouth daily. Qty: 30 tablet, Refills: 0    feeding supplement, ENSURE ENLIVE, (ENSURE ENLIVE) LIQD Take 237 mLs by mouth 3 (three) times daily with meals. Qty: 237 mL, Refills: 12    tolvaptan (SAMSCA) 15 MG TABS tablet Take 1 tablet (15 mg total) by mouth daily. Qty: 30 tablet, Refills: 0        Today   VITAL SIGNS:  Blood pressure (!) 165/82, pulse 80, temperature 98.2 F (36.8 C), temperature source Oral, resp. rate 16, height 5\' 3"  (1.6 m), weight 50.1 kg (110 lb 6.4 oz), SpO2 94 %.  I/O:   Intake/Output Summary (Last 24 hours) at 08/01/16 1055 Last data filed at 08/01/16 1012  Gross per 24 hour  Intake              963 ml  Output              200 ml  Net              763 ml    PHYSICAL EXAMINATION:  Physical Exam  GENERAL:  82 y.o.-year-old patient lying in the bed with no acute distress.  LUNGS: Normal breath sounds  bilaterally, no wheezing, rales,rhonchi or crepitation. No use of accessory muscles of respiration.  CARDIOVASCULAR: S1, S2 normal. No murmurs, rubs, or gallops.  ABDOMEN: Soft, non-tender, non-distended. Bowel sounds present. No organomegaly or mass.  NEUROLOGIC: Moves all 4 extremities. PSYCHIATRIC: The patient is alert and oriented x 3.  SKIN: No obvious rash, lesion, or ulcer.   DATA REVIEW:   CBC  Recent Labs Lab 07/30/16 0454  WBC 8.1  HGB 11.1*  HCT 32.6*  PLT 169    Chemistries   Recent Labs Lab 07/29/16 1348 07/30/16 0454  NA 131* 132*  K 4.0 3.7  CL 100* 101  CO2 26 24  GLUCOSE 108* 113*  BUN 19 19  CREATININE 0.55 0.60  CALCIUM 8.8* 8.3*  AST 29  --   ALT 20  --   ALKPHOS 88  --   BILITOT 0.6  --     Cardiac Enzymes No results for input(s): TROPONINI in the last 168 hours.  Microbiology Results  Results for orders placed or performed during the hospital encounter of 07/07/16  Aerobic Culture (superficial specimen)     Status: None   Collection Time: 07/07/16  5:30 PM  Result Value Ref Range Status   Specimen Description EYE LEFT  Final   Special Requests NONE  Final   Gram Stain NO WBC SEEN NO ORGANISMS SEEN   Final   Culture   Final    NO GROWTH 2 DAYS Performed at [facility]    Report Status 07/10/2016 FINAL  Final    RADIOLOGY:  No results found.  Follow up with PCP in 1 week.  Management plans discussed with the patient, family and they are in agreement.  CODE STATUS:     Code Status Orders        Start     Ordered   07/29/16 2009  Do not attempt resuscitation (DNR)  Continuous    Question Answer Comment  In the event of cardiac or respiratory ARREST Do not call a "code blue"   In the event of cardiac or respiratory ARREST Do not perform Intubation, CPR, defibrillation or ACLS   In the event of cardiac or respiratory ARREST Use medication by any route, position, wound care, and other measures to relive pain and  suffering. May use oxygen, suction and manual treatment of airway obstruction as needed for comfort.   Comments RN may pronounce      07/29/16 2008    Code Status History    Date Active Date Inactive Code Status Order ID Comments User Context   12/14/2015  2:52 PM 12/21/2015  8:03 PM DNR [id number]  Fritzi Mandes, MD Inpatient    Advance Directive Documentation   Flowsheet  Row Most Recent Value  Type of Advance Directive  Healthcare Power of Attorney, Living will  Pre-existing out of facility DNR order (yellow form or pink MOST form)  No data  "MOST" Form in Place?  No data      TOTAL TIME TAKING CARE OF THIS PATIENT ON DAY OF DISCHARGE: more than 30 minutes.   Hillary Bow R M.D on 08/01/2016 at 10:55 AM  Between 7am to 6pm - Pager - [phone]  After 6pm go to [email or web address] - password [email or web address] [place] [facility]  Office  [phone]  CC: Primary care physician; Wilhemena Durie, MD  Note: This dictation was prepared with Dragon dictation along with smaller phrase technology. Any transcriptional errors that result from this process are unintentional.

## 2016-08-01 NOTE — Progress Notes (Signed)
Repot given to Janeann Merl at [facility]. EMS called for transport.  Deri Fuelling, RN

## 2016-08-01 NOTE — Care Management Important Message (Signed)
Important Message  Patient Details  Name: Aileth Foo MRN: [id number] Date of Birth: 18-Feb-1914   Medicare Important Message Given:  N/A - LOS <3 / Initial given by admissions    Alvie Heidelberg, RN 08/01/2016, 11:09 AM

## 2016-08-01 NOTE — Clinical Social Work Placement (Signed)
   CLINICAL SOCIAL WORK PLACEMENT  NOTE  Date:  08/01/2016  Patient Details  Name: [name] MRN: [id number] Date of Birth: [birth year]  Clinical Social Work is seeking post-discharge placement for this patient at the [place] level of care (*CSW will initial, date and re-position this form in  chart as items are completed):  Yes   Patient/family provided with [place] Work Department's list of facilities offering this level of care within the geographic area requested by the patient (or if unable, by the patient's family).  Yes   Patient/family informed of their freedom to choose among providers that offer the needed level of care, that participate in Medicare, Medicaid or managed care program needed by the patient, have an available bed and are willing to accept the patient.  Yes   Patient/family informed of [facility]'s ownership interest in [facility] and [facility], as well as of the fact that they are under no obligation to receive care at these facilities.  PASRR submitted to EDS on       PASRR number received on       Existing PASRR number confirmed on 07/30/16     FL2 transmitted to all facilities in geographic area requested by pt/family on 07/30/16     FL2 transmitted to all facilities within larger geographic area on       Patient informed that his/her managed care company has contracts with or will negotiate with certain facilities, including the following:        Yes   Patient/family informed of bed offers received.  Patient chooses bed at  [facility])     Physician recommends and patient chooses bed at      Patient to be transferred to  [facility] ) on 08/01/16.  Patient to be transferred to facility by  [facility] )     Patient family notified on 08/01/16 of transfer.  Name of family member notified:   (Patient's son [name] and daughter in law are aware of D/C today. )     PHYSICIAN        Additional Comment:    _______________________________________________ Tayah Idrovo, Veronia Beets, LCSW 08/01/2016, 11:27 AM

## 2016-08-02 DIAGNOSIS — E871 Hypo-osmolality and hyponatremia: Secondary | ICD-10-CM | POA: Diagnosis not present

## 2016-08-02 DIAGNOSIS — K5900 Constipation, unspecified: Secondary | ICD-10-CM | POA: Diagnosis not present

## 2016-08-02 DIAGNOSIS — M810 Age-related osteoporosis without current pathological fracture: Secondary | ICD-10-CM | POA: Diagnosis not present

## 2016-08-02 DIAGNOSIS — E039 Hypothyroidism, unspecified: Secondary | ICD-10-CM | POA: Diagnosis not present

## 2016-08-04 ENCOUNTER — Other Ambulatory Visit: Payer: Self-pay | Admitting: Family Medicine

## 2016-08-04 LAB — CBC WITH DIFFERENTIAL/PLATELET
Basophils Absolute: 0.1 10*3/uL (ref 0–0.1)
Basophils Relative: 1 %
EOS ABS: 0.3 10*3/uL (ref 0–0.7)
EOS PCT: 3 %
HCT: 32.3 % — ABNORMAL LOW (ref 35.0–47.0)
Hemoglobin: 10.8 g/dL — ABNORMAL LOW (ref 12.0–16.0)
LYMPHS ABS: 2 10*3/uL (ref 1.0–3.6)
LYMPHS PCT: 19 %
MCH: 27.5 pg (ref 26.0–34.0)
MCHC: 33.6 g/dL (ref 32.0–36.0)
MCV: 81.9 fL (ref 80.0–100.0)
MONO ABS: 1.1 10*3/uL — AB (ref 0.2–0.9)
MONOS PCT: 10 %
Neutro Abs: 7.3 10*3/uL — ABNORMAL HIGH (ref 1.4–6.5)
Neutrophils Relative %: 67 %
PLATELETS: 280 10*3/uL (ref 150–440)
RBC: 3.94 MIL/uL (ref 3.80–5.20)
RDW: 15.4 % — ABNORMAL HIGH (ref 11.5–14.5)
WBC: 10.7 10*3/uL (ref 3.6–11.0)

## 2016-08-04 LAB — BASIC METABOLIC PANEL
Anion gap: 6 (ref 5–15)
BUN: 47 mg/dL — AB (ref 6–20)
CHLORIDE: 97 mmol/L — AB (ref 101–111)
CO2: 27 mmol/L (ref 22–32)
CREATININE: 0.56 mg/dL (ref 0.44–1.00)
Calcium: 8.8 mg/dL — ABNORMAL LOW (ref 8.9–10.3)
GFR calc Af Amer: >60 mL/min (ref >60)
GLUCOSE: 132 mg/dL — AB (ref 65–99)
Potassium: 4.3 mmol/L (ref 3.5–5.1)
SODIUM: 130 mmol/L — AB (ref 135–145)

## 2016-08-09 ENCOUNTER — Non-Acute Institutional Stay (SKILLED_NURSING_FACILITY): Payer: Medicare Other | Admitting: Gerontology

## 2016-08-09 DIAGNOSIS — G8918 Other acute postprocedural pain: Secondary | ICD-10-CM

## 2016-08-11 NOTE — Progress Notes (Signed)
Location:      Place of Service:  SNF (31) Provider:  Toni Arthurs, NP-C  Wilhemena Durie, MD  Patient Care Team: Jerrol Banana., MD as PCP - General [facility])  Extended Emergency Contact Information Primary Emergency Contact: Myrtie Cruise A Address: [address]          [place], [place] [address] Johnnette Litter of [place] Phone: [phone] Work Phone: [phone] Relation: Son Secondary Emergency Contact: [place] of [place] Mobile Phone: [phone] Relation: Daughter  Code Status:  dnr Goals of care: Advanced Directive information Advanced Directives 07/29/2016  Does patient have an advance directive? Yes  Type of Paramedic of [place];Living will  Does patient want to make changes to advanced directive? No - Patient declined  Copy of advanced directive(s) in chart? Yes     Chief Complaint  Patient presents with  . Acute Visit    HPI:  Pt is a 82 y.o. female seen today for Medication Management of controlled substances used to control pain as a result of left femoral neck fracture. The patient describes their pain as well controlled on current regimen. Average pain score is 6/10. Other modalities used to control symptoms include ice, prn pain regimen. Pt has tried ice, prn pain regimen  in the past with unsatisfactory or incomplete relief of symptoms. Past Medical History:  Diagnosis Date  . Eye infection   . Falls   . Pelvic fracture [facility])    Past Surgical History:  Procedure Laterality Date  . ABDOMINAL HYSTERECTOMY    . CESAREAN SECTION      No Known Allergies    Medication List       Accurate as of 08/09/16 11:59 PM. Always use your most recent med list.          amLODipine 10 MG tablet Commonly known as:  NORVASC Take 1 tablet (10 mg total) by mouth daily.   docusate sodium 100 MG capsule Commonly known as:  COLACE Take 1 capsule (100 mg total) by mouth 2 (two)  times daily.   feeding supplement (ENSURE ENLIVE) Liqd Take 237 mLs by mouth 3 (three) times daily with meals.   levobunolol 0.5 % ophthalmic solution Commonly known as:  BETAGAN Place 1 drop into both eyes 2 (two) times daily.   levothyroxine 50 MCG tablet Commonly known as:  SYNTHROID, LEVOTHROID Take 1 tablet (50 mcg total) by mouth daily.   metoprolol tartrate 25 MG tablet Commonly known as:  LOPRESSOR Take 2 tablets (50 mg total) by mouth daily.   oxyCODONE-acetaminophen 5-325 MG tablet Commonly known as:  PERCOCET/ROXICET Take 1 tablet by mouth every 6 (six) hours as needed for severe pain.   pantoprazole 40 MG tablet Commonly known as:  PROTONIX Take 1 tablet (40 mg total) by mouth 2 (two) times daily.   ramipril 5 MG capsule Commonly known as:  ALTACE Take 1 capsule (5 mg total) by mouth daily.   tolvaptan 15 MG Tabs tablet Commonly known as:  SAMSCA Take 1 tablet (15 mg total) by mouth daily.       Review of Systems  Constitutional: Negative for activity change, appetite change, chills, diaphoresis and fever.  HENT: Negative for congestion, sneezing, sore throat, trouble swallowing and voice change.   Eyes: Negative for pain, redness and visual disturbance.  Respiratory: Negative for apnea, cough, choking, chest tightness, shortness of breath and wheezing.   Cardiovascular: Negative for chest pain, palpitations and leg swelling.  Gastrointestinal: Negative  for abdominal distention, abdominal pain, constipation, diarrhea and nausea.  Genitourinary: Negative for difficulty urinating, dysuria, frequency and urgency.  Musculoskeletal: Negative for back pain, gait problem and myalgias. Arthralgias: typical arthritis.  Skin: Negative for color change, pallor, rash and wound.  Neurological: Negative for dizziness, tremors, syncope, speech difficulty, weakness, numbness and headaches.  Psychiatric/Behavioral: Negative for agitation and behavioral problems.  All other  systems reviewed and are negative.    There is no immunization history on file for this patient. Pertinent  Health Maintenance Due  Topic Date Due  . DEXA SCAN  04/23/1981  . PNA vac Low Risk Adult (1 of 2 - PCV13) 04/23/1981  . INFLUENZA VACCINE  07/12/2016   No flowsheet data found. Functional Status Survey:    Vitals:   08/09/16 0500  BP: (!) 111/51  Pulse: 60  Resp: 17  Temp: 97.5 F (36.4 C)  SpO2: 97%  Weight: 100 lb 8 oz (45.6 kg)   Body mass index is 17.8 kg/m. Physical Exam  Constitutional: She is oriented to person, place, and time. Vital signs are normal. She appears well-developed and well-nourished. She is active and cooperative. She does not appear ill. No distress.  HENT:  Head: Normocephalic and atraumatic.  Mouth/Throat: Uvula is midline, oropharynx is clear and moist and mucous membranes are normal. Mucous membranes are not pale, not dry and not cyanotic.  Eyes: Conjunctivae, EOM and lids are normal. Pupils are equal, round, and reactive to light.  Neck: Trachea normal, normal range of motion and full passive range of motion without pain. Neck supple. No JVD present. No tracheal deviation, no edema and no erythema present. No thyromegaly present.  Cardiovascular: Normal rate, regular rhythm, normal heart sounds, intact distal pulses and normal pulses.  Exam reveals no gallop and no distant heart sounds.   Pulmonary/Chest: Effort normal. No accessory muscle usage. No respiratory distress. She has no wheezes. She has no rales. She exhibits no tenderness.  Abdominal: Normal appearance and bowel sounds are normal. She exhibits no distension and no ascites. There is no tenderness.  Musculoskeletal: Normal range of motion. She exhibits no edema or tenderness.  Expected osteoarthritis, stiffness  Neurological: She is alert and oriented to person, place, and time. She has normal strength.  Skin: Skin is warm, dry and intact. No rash noted. She is not diaphoretic. No  cyanosis or erythema. No pallor. Nails show no clubbing.  Psychiatric: She has a normal mood and affect. Her speech is normal and behavior is normal. Judgment and thought content normal. Cognition and memory are normal.  Nursing note and vitals reviewed.   Labs reviewed:  Recent Labs  07/29/16 1348 07/30/16 0454 08/04/16 1155  NA 131* 132* 130*  K 4.0 3.7 4.3  CL 100* 101 97*  CO2 26 24 27   GLUCOSE 108* 113* 132*  BUN 19 19 47*  CREATININE 0.55 0.60 0.56  CALCIUM 8.8* 8.3* 8.8*    Recent Labs  12/14/15 1308 05/20/16 1419 07/29/16 1348  AST 26 18 29   ALT 16 10 20   ALKPHOS 63 127* 88  BILITOT 0.8 0.4 0.6  PROT 6.6 7.4 6.9  ALBUMIN 3.6 4.3 3.8    Recent Labs  05/20/16 1419 07/29/16 1348 07/30/16 0454 08/04/16 1155  WBC 9.6 12.8* 8.1 10.7  NEUTROABS 5.1 10.1*  --  7.3*  HGB  --  11.4* 11.1* 10.8*  HCT 39.0 34.5* 32.6* 32.3*  MCV 85 81.5 81.9 81.9  PLT 318 229 169 280   Lab Results  Component Value Date   TSH 5.720 (H) 05/20/2016   No results found for: HGBA1C No results found for: CHOL, HDL, LDLCALC, LDLDIRECT, TRIG, CHOLHDL  Significant Diagnostic Results in last 30 days:  Dg Chest 2 View  Result Date: 07/29/2016 CLINICAL DATA:  Fall. EXAM: CHEST  2 VIEW COMPARISON:  None. FINDINGS: The heart is mildly enlarged. Interstitial coarsening is chronic. Emphysematous changes are again noted. There is no edema or effusion to suggest failure. Atherosclerotic changes are noted in the thoracic aorta. Remote right-sided rib fracture is evident. There may be a new right-side rib fracture as well. More likely, this was present previously but not as well seen. IMPRESSION: 1. Cardiomegaly without failure. 2. Emphysema. 3. Atherosclerosis of the thoracic aorta. 4. Right-sided rib fractures are likely remote. Please correlate with acute tenderness in the right chest. Electronically Signed   By: San Morelle M.D.   On: 07/29/2016 13:56   Ct Head Wo Contrast  Result  Date: 07/29/2016 CLINICAL DATA:  [place] getting out of a chair, struck head on cabinet EXAM: CT HEAD WITHOUT CONTRAST TECHNIQUE: Contiguous axial images were obtained from the base of the skull through the vertex without intravenous contrast. COMPARISON:  12/16/2015 FINDINGS: LEFT orbital prosthesis. Generalized atrophy. Normal ventricular morphology. No midline shift or mass effect. Small vessel chronic ischemic changes of deep cerebral white matter. No intracranial hemorrhage, mass lesion, or evidence of acute infarction. No extra-axial fluid collections. Chronic opacification of RIGHT maxillary sinus, RIGHT mastoid air cells, and RIGHT frontal sinus. Atherosclerotic calcification of internal carotid arteries at carotid siphons. Skull intact. IMPRESSION: Atrophy with small vessel chronic ischemic changes of deep cerebral white matter. No acute intracranial abnormalities. Chronic RIGHT side sinus disease changes as above. Electronically Signed   By: Lavonia Dana M.D.   On: 07/29/2016 16:30   Ct Pelvis Wo Contrast  Result Date: 07/29/2016 CLINICAL DATA:  Fall.  Left hip pain. EXAM: CT PELVIS WITHOUT CONTRAST TECHNIQUE: Multidetector CT imaging of the pelvis was performed following the standard protocol without intravenous contrast. COMPARISON:  Plain films earlier today. FINDINGS: There is a comminuted fracture noted through the left acetabulum, extending superiorly into the left iliac bone and also into the left superior acetabulum. Probable old superior and inferior acetabular fractures on the left. Hardware noted within the proximal left femur from prior injury and surgery. Degenerative changes in the lower lumbar spine. Grade 1 anterolisthesis of L4 on L5. IMPRESSION: Displaced comminuted fracture through the left acetabulum involving the left superior pubic ramus and left iliac bone. Electronically Signed   By: Rolm Baptise M.D.   On: 07/29/2016 16:33   Dg Hip Unilat W Or Wo Pelvis 2-3 Views Left  Addendum  Date: 07/29/2016   ADDENDUM REPORT: 07/29/2016 17:21 ADDENDUM: After review of the pelvic CT, the left acetabular protrusio is associated with a comminuted left acetabular fracture. This is difficult to appreciate on the radiographs due to the osteopenia and old traumatic changes. In addition, there is a fracture of the left ilium. Refer to the pelvic CT dated 07/29/2016. Electronically Signed   By: Markus Daft M.D.   On: 07/29/2016 17:21   Result Date: 07/29/2016 CLINICAL DATA:  Weakness.  Fell with pain in the posterior left hip. EXAM: DG HIP (WITH OR WITHOUT PELVIS) 2-3V LEFT COMPARISON:  09/13/2014 FINDINGS: Again noted is a dynamic hip screw in the proximal left femur. Old fractures involving the left superior and inferior pubic rami. Patient has developed significant left acetabular protrusio since the previous  examination. The left hip appears to be located. Right pubic rami appear to be intact. No gross abnormality to the right hip joint. 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 No acute bone abnormality identified. Electronically Signed: By: Markus Daft M.D. On: 07/29/2016 13:54    Assessment/Plan 1. Acute postoperative pain   Continue complementary therapies including:  PT/OT  Restorative Nursing  Ice pack to site QID and prn  Diversional activities  Repositioning Q2 hours and prn  Prescription written and forwarded to pharmacy for:   Percocet 5/325- 1 tablet po TID scheduled- hold for sedation  Percocet 5/325- 1 tablet po Q 6 hrs prn   Family/ staff Communication:   Total Time:  Documentation:  Face to Face:  Family/Phone:   Labs/tests ordered:    Medication list reviewed and assessed for continued appropriateness. It is my medical opinion discontinuation of medications would result in decompensation and decreased quality of life.   Vikki Ports, NP-C Geriatrics [facility] [phone], [place] [address] Cell Phone (Mon-Fri 8am-5pm):  [phone] On Call:  [phone] & follow prompts after 5pm & weekends Office Phone:  [phone] Office Fax:  [phone]

## 2016-08-12 ENCOUNTER — Encounter
Admission: RE | Admit: 2016-08-12 | Discharge: 2016-08-12 | Disposition: A | Discharge status: Home / Self Care | Payer: Medicare Other | Source: Ambulatory Visit | Attending: Internal Medicine | Admitting: Internal Medicine

## 2016-08-16 ENCOUNTER — Non-Acute Institutional Stay (SKILLED_NURSING_FACILITY): Payer: Medicare Other | Admitting: Gerontology

## 2016-08-16 DIAGNOSIS — G8918 Other acute postprocedural pain: Secondary | ICD-10-CM

## 2016-08-16 DIAGNOSIS — M80052D Age-related osteoporosis with current pathological fracture, left femur, subsequent encounter for fracture with routine healing: Secondary | ICD-10-CM

## 2016-08-16 LAB — URINALYSIS COMPLETE WITH MICROSCOPIC (ARMC ONLY)
BACTERIA UA: NONE SEEN
Bilirubin Urine: NEGATIVE
Glucose, UA: NEGATIVE mg/dL
Hgb urine dipstick: NEGATIVE
KETONES UR: NEGATIVE mg/dL
LEUKOCYTES UA: NEGATIVE
NITRITE: NEGATIVE
PH: 5 (ref 5.0–8.0)
PROTEIN: NEGATIVE mg/dL
SQUAMOUS EPITHELIAL / LPF: NONE SEEN
Specific Gravity, Urine: 1.021 (ref 1.005–1.030)

## 2016-08-16 LAB — BASIC METABOLIC PANEL
ANION GAP: 6 (ref 5–15)
BUN: 44 mg/dL — ABNORMAL HIGH (ref 6–20)
CALCIUM: 8.6 mg/dL — AB (ref 8.9–10.3)
CO2: 25 mmol/L (ref 22–32)
Chloride: 107 mmol/L (ref 101–111)
Creatinine, Ser: 0.65 mg/dL (ref 0.44–1.00)
Glucose, Bld: 97 mg/dL (ref 65–99)
Potassium: 4.1 mmol/L (ref 3.5–5.1)
Sodium: 138 mmol/L (ref 135–145)

## 2016-08-17 LAB — URINE CULTURE: CULTURE: NO GROWTH

## 2016-08-21 DIAGNOSIS — M80052D Age-related osteoporosis with current pathological fracture, left femur, subsequent encounter for fracture with routine healing: Secondary | ICD-10-CM | POA: Insufficient documentation

## 2016-08-21 NOTE — Progress Notes (Signed)
Location:      Place of Service:  SNF (31) Provider:  Toni Arthurs, NP-C  Wilhemena Durie, MD  Patient Care Team: Jerrol Banana., MD as PCP - General [facility])  Extended Emergency Contact Information Primary Emergency Contact: Myrtie Cruise A Address: [address]          [place], [place] [address] Johnnette Litter of [place] Phone: [phone] Work Phone: [phone] Relation: Son Secondary Emergency Contact: [place] of [place] Mobile Phone: [phone] Relation: Daughter  Code Status:  dnr Goals of care: Advanced Directive information Advanced Directives 07/29/2016  Does patient have an advance directive? Yes  Type of Paramedic of [place];Living will  Does patient want to make changes to advanced directive? No - Patient declined  Copy of advanced directive(s) in chart? Yes     Chief Complaint  Patient presents with  . Follow-up    HPI:  Pt is a 82 y.o. female seen today for Medication Management of controlled substances used to control pain as a result of left femoral neck fracture. The patient describes their pain as well controlled on current regimen. Average pain score is 3/10. Other modalities used to control symptoms include ice, prn pain regimen. Pt has tried ice, prn pain regimen  in the past with unsatisfactory or incomplete relief of symptoms. She is also on Cholecalciferol 4,000 units daily for bone health. VSS. Pain well controlled. Pt denies n/v/d/f/c/cp/sob/ha/ abd pain/dizziness, etc. No other complaints  Past Medical History:  Diagnosis Date  . Eye infection   . Falls   . Pelvic fracture [facility])    Past Surgical History:  Procedure Laterality Date  . ABDOMINAL HYSTERECTOMY    . CESAREAN SECTION      Allergies  Allergen Reactions  . Risedronate     Other reaction(s): Other (See Comments) GI upset  . Sulfa Antibiotics     Other reaction(s): Rash  . Teriparatide    Other reaction(s): Other (See Comments) GI upset      Medication List       Accurate as of 08/16/16 11:59 PM. Always use your most recent med list.          amLODipine 10 MG tablet Commonly known as:  NORVASC Take 1 tablet (10 mg total) by mouth daily.   docusate sodium 100 MG capsule Commonly known as:  COLACE Take 1 capsule (100 mg total) by mouth 2 (two) times daily.   feeding supplement (ENSURE ENLIVE) Liqd Take 237 mLs by mouth 3 (three) times daily with meals.   levobunolol 0.5 % ophthalmic solution Commonly known as:  BETAGAN Place 1 drop into both eyes 2 (two) times daily.   levothyroxine 50 MCG tablet Commonly known as:  SYNTHROID, LEVOTHROID Take 1 tablet (50 mcg total) by mouth daily.   metoprolol tartrate 25 MG tablet Commonly known as:  LOPRESSOR Take 2 tablets (50 mg total) by mouth daily.   oxyCODONE-acetaminophen 5-325 MG tablet Commonly known as:  PERCOCET/ROXICET Take 1 tablet by mouth every 6 (six) hours as needed for severe pain.   pantoprazole 40 MG tablet Commonly known as:  PROTONIX Take 1 tablet (40 mg total) by mouth 2 (two) times daily.   ramipril 5 MG capsule Commonly known as:  ALTACE Take 1 capsule (5 mg total) by mouth daily.   tolvaptan 15 MG Tabs tablet Commonly known as:  SAMSCA Take 1 tablet (15 mg total) by mouth daily.       Review of  Systems  Constitutional: Negative for activity change, appetite change, chills, diaphoresis and fever.  HENT: Negative for congestion, sneezing, sore throat, trouble swallowing and voice change.   Eyes: Negative for pain, redness and visual disturbance.  Respiratory: Negative for apnea, cough, choking, chest tightness, shortness of breath and wheezing.   Cardiovascular: Negative for chest pain, palpitations and leg swelling.  Gastrointestinal: Negative for abdominal distention, abdominal pain, constipation, diarrhea and nausea.  Genitourinary: Negative for difficulty urinating, dysuria,  frequency and urgency.  Musculoskeletal: Negative for back pain, gait problem and myalgias. Arthralgias: typical arthritis.  Skin: Negative for color change, pallor, rash and wound.  Neurological: Negative for dizziness, tremors, syncope, speech difficulty, weakness, numbness and headaches.  Psychiatric/Behavioral: Negative for agitation and behavioral problems.  All other systems reviewed and are negative.    There is no immunization history on file for this patient. Pertinent  Health Maintenance Due  Topic Date Due  . DEXA SCAN  04/23/1981  . PNA vac Low Risk Adult (1 of 2 - PCV13) 04/23/1981  . INFLUENZA VACCINE  07/12/2016   No flowsheet data found. Functional Status Survey:    Vitals:   08/16/16 0500  BP: 134/82  Pulse: 67  Resp: 17  Temp: 97.9 F (36.6 C)  SpO2: 94%  Weight: 98 lb 4.8 oz (44.6 kg)   Body mass index is 17.41 kg/m. Physical Exam  Constitutional: She is oriented to person, place, and time. Vital signs are normal. She appears well-developed and well-nourished. She is active and cooperative. She does not appear ill. No distress.  HENT:  Head: Normocephalic and atraumatic.  Mouth/Throat: Uvula is midline, oropharynx is clear and moist and mucous membranes are normal. Mucous membranes are not pale, not dry and not cyanotic.  Eyes: Conjunctivae, EOM and lids are normal. Pupils are equal, round, and reactive to light.  Neck: Trachea normal, normal range of motion and full passive range of motion without pain. Neck supple. No JVD present. No tracheal deviation, no edema and no erythema present. No thyromegaly present.  Cardiovascular: Normal rate, regular rhythm, normal heart sounds, intact distal pulses and normal pulses.  Exam reveals no gallop and no distant heart sounds.   Pulmonary/Chest: Effort normal. No accessory muscle usage. No respiratory distress. She has no wheezes. She has no rales. She exhibits no tenderness.  Abdominal: Normal appearance and bowel  sounds are normal. She exhibits no distension and no ascites. There is no tenderness.  Musculoskeletal: Normal range of motion. She exhibits no edema or tenderness.  Expected osteoarthritis, stiffness  Neurological: She is alert and oriented to person, place, and time. She has normal strength.  Skin: Skin is warm, dry and intact. No rash noted. She is not diaphoretic. No cyanosis or erythema. No pallor. Nails show no clubbing.  Psychiatric: She has a normal mood and affect. Her speech is normal and behavior is normal. Judgment and thought content normal. Cognition and memory are normal.  Nursing note and vitals reviewed.   Labs reviewed:  Recent Labs  07/30/16 0454 08/04/16 1155 08/16/16 0803  NA 132* 130* 138  K 3.7 4.3 4.1  CL 101 97* 107  CO2 24 27 25   GLUCOSE 113* 132* 97  BUN 19 47* 44*  CREATININE 0.60 0.56 0.65  CALCIUM 8.3* 8.8* 8.6*    Recent Labs  12/14/15 1308 05/20/16 1419 07/29/16 1348  AST 26 18 29   ALT 16 10 20   ALKPHOS 63 127* 88  BILITOT 0.8 0.4 0.6  PROT 6.6 7.4 6.9  ALBUMIN 3.6 4.3 3.8    Recent Labs  05/20/16 1419 07/29/16 1348 07/30/16 0454 08/04/16 1155  WBC 9.6 12.8* 8.1 10.7  NEUTROABS 5.1 10.1*  --  7.3*  HGB  --  11.4* 11.1* 10.8*  HCT 39.0 34.5* 32.6* 32.3*  MCV 85 81.5 81.9 81.9  PLT 318 229 169 280   Lab Results  Component Value Date   TSH 5.720 (H) 05/20/2016   No results found for: HGBA1C No results found for: CHOL, HDL, LDLCALC, LDLDIRECT, TRIG, CHOLHDL  Significant Diagnostic Results in last 30 days:  Dg Chest 2 View  Result Date: 07/29/2016 CLINICAL DATA:  Fall. EXAM: CHEST  2 VIEW COMPARISON:  None. FINDINGS: The heart is mildly enlarged. Interstitial coarsening is chronic. Emphysematous changes are again noted. There is no edema or effusion to suggest failure. Atherosclerotic changes are noted in the thoracic aorta. Remote right-sided rib fracture is evident. There may be a new right-side rib fracture as well. More  likely, this was present previously but not as well seen. IMPRESSION: 1. Cardiomegaly without failure. 2. Emphysema. 3. Atherosclerosis of the thoracic aorta. 4. Right-sided rib fractures are likely remote. Please correlate with acute tenderness in the right chest. Electronically Signed   By: San Morelle M.D.   On: 07/29/2016 13:56   Ct Head Wo Contrast  Result Date: 07/29/2016 CLINICAL DATA:  [place] getting out of a chair, struck head on cabinet EXAM: CT HEAD WITHOUT CONTRAST TECHNIQUE: Contiguous axial images were obtained from the base of the skull through the vertex without intravenous contrast. COMPARISON:  12/16/2015 FINDINGS: LEFT orbital prosthesis. Generalized atrophy. Normal ventricular morphology. No midline shift or mass effect. Small vessel chronic ischemic changes of deep cerebral white matter. No intracranial hemorrhage, mass lesion, or evidence of acute infarction. No extra-axial fluid collections. Chronic opacification of RIGHT maxillary sinus, RIGHT mastoid air cells, and RIGHT frontal sinus. Atherosclerotic calcification of internal carotid arteries at carotid siphons. Skull intact. IMPRESSION: Atrophy with small vessel chronic ischemic changes of deep cerebral white matter. No acute intracranial abnormalities. Chronic RIGHT side sinus disease changes as above. Electronically Signed   By: Lavonia Dana M.D.   On: 07/29/2016 16:30   Ct Pelvis Wo Contrast  Result Date: 07/29/2016 CLINICAL DATA:  Fall.  Left hip pain. EXAM: CT PELVIS WITHOUT CONTRAST TECHNIQUE: Multidetector CT imaging of the pelvis was performed following the standard protocol without intravenous contrast. COMPARISON:  Plain films earlier today. FINDINGS: There is a comminuted fracture noted through the left acetabulum, extending superiorly into the left iliac bone and also into the left superior acetabulum. Probable old superior and inferior acetabular fractures on the left. Hardware noted within the proximal left  femur from prior injury and surgery. Degenerative changes in the lower lumbar spine. Grade 1 anterolisthesis of L4 on L5. IMPRESSION: Displaced comminuted fracture through the left acetabulum involving the left superior pubic ramus and left iliac bone. Electronically Signed   By: Rolm Baptise M.D.   On: 07/29/2016 16:33   Dg Hip Unilat W Or Wo Pelvis 2-3 Views Left  Addendum Date: 07/29/2016   ADDENDUM REPORT: 07/29/2016 17:21 ADDENDUM: After review of the pelvic CT, the left acetabular protrusio is associated with a comminuted left acetabular fracture. This is difficult to appreciate on the radiographs due to the osteopenia and old traumatic changes. In addition, there is a fracture of the left ilium. Refer to the pelvic CT dated 07/29/2016. Electronically Signed   By: Markus Daft M.D.   On: 07/29/2016 17:21  Result Date: 07/29/2016 CLINICAL DATA:  Weakness.  Fell with pain in the posterior left hip. EXAM: DG HIP (WITH OR WITHOUT PELVIS) 2-3V LEFT COMPARISON:  09/13/2014 FINDINGS: Again noted is a dynamic hip screw in the proximal left femur. Old fractures involving the left superior and inferior pubic rami. Patient has developed significant left acetabular protrusio since the previous examination. The left hip appears to be located. Right pubic rami appear to be intact. No gross abnormality to the right hip joint. 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 No acute bone abnormality identified. Electronically Signed: By: Markus Daft M.D. On: 07/29/2016 13:54    Assessment/Plan 1. Acute postoperative pain   Continue complementary therapies including:  PT/OT  Restorative Nursing  Ice pack to site QID and prn  Diversional activities  Repositioning Q2 hours and prn  Prescription written and forwarded to pharmacy for:   Percocet 5/325- 1 tablet po TID scheduled- hold for sedation  Percocet 5/325- 1 tablet po Q 6 hrs prn  2. Age related  osteoporosis with current pathological fracture of left femur with routine healing   Continue working with physical/occupational therapies  Out of bed to chair as much as possible  Pain control for improved healing  Cholecalciferol 4,000 units daily   Family/ staff Communication:   Total Time:  Documentation:  Face to Face:  Family/Phone:   Labs/tests ordered:    Medication list reviewed and assessed for continued appropriateness. It is my medical opinion discontinuation of medications would result in decompensation and decreased quality of life.   Vikki Ports, NP-C Geriatrics [facility] [phone], [place] [address] Cell Phone (Mon-Fri 8am-5pm):  [phone] On Call:  [phone] & follow prompts after 5pm & weekends Office Phone:  [phone] Office Fax:  [phone]

## 2016-08-23 ENCOUNTER — Non-Acute Institutional Stay (SKILLED_NURSING_FACILITY): Payer: Medicare Other | Admitting: Gerontology

## 2016-08-23 DIAGNOSIS — M80052D Age-related osteoporosis with current pathological fracture, left femur, subsequent encounter for fracture with routine healing: Secondary | ICD-10-CM

## 2016-08-23 DIAGNOSIS — G8918 Other acute postprocedural pain: Secondary | ICD-10-CM | POA: Diagnosis not present

## 2016-08-24 ENCOUNTER — Other Ambulatory Visit: Payer: Self-pay | Admitting: Family Medicine

## 2016-08-24 DIAGNOSIS — S32511A Fracture of superior rim of right pubis, initial encounter for closed fracture: Secondary | ICD-10-CM | POA: Diagnosis not present

## 2016-08-27 NOTE — Progress Notes (Signed)
Location:      Place of Service:  SNF (31) Provider:  Toni Arthurs, NP-C  Wilhemena Durie, MD  Patient Care Team: Jerrol Banana., MD as PCP - General [facility])  Extended Emergency Contact Information Primary Emergency Contact: Myrtie Cruise A Address: [address]          [place], [place] [address] Johnnette Litter of [place] Phone: [phone] Work Phone: [phone] Relation: Son Secondary Emergency Contact: [place] of [place] Mobile Phone: [phone] Relation: Daughter  Code Status:  dnr Goals of care: Advanced Directive information Advanced Directives 07/29/2016  Does patient have an advance directive? Yes  Type of Paramedic of [place];Living will  Does patient want to make changes to advanced directive? No - Patient declined  Copy of advanced directive(s) in chart? Yes     No chief complaint on file.   HPI:  Pt is a 82 y.o. female seen today for Follow-up  of controlled substances used to control pain as a result of left femoral neck fracture. The patient describes their pain as well controlled on current regimen. Average pain score is 3/10. Other modalities used to control symptoms include ice, prn pain regimen. Pt has tried ice, prn pain regimen  in the past with unsatisfactory or incomplete relief of symptoms. She is also on Cholecalciferol 4,000 units daily for bone health. VSS. Pain well controlled. Pt denies n/v/d/f/c/cp/sob/ha/ abd pain/dizziness, etc. No other complaints  Past Medical History:  Diagnosis Date  . Eye infection   . Falls   . Pelvic fracture [facility])    Past Surgical History:  Procedure Laterality Date  . ABDOMINAL HYSTERECTOMY    . CESAREAN SECTION      Allergies  Allergen Reactions  . Risedronate     Other reaction(s): Other (See Comments) GI upset  . Sulfa Antibiotics     Other reaction(s): Rash  . Teriparatide     Other reaction(s): Other (See  Comments) GI upset      Medication List       Accurate as of 08/23/16 11:59 PM. Always use your most recent med list.          amLODipine 10 MG tablet Commonly known as:  NORVASC Take 1 tablet (10 mg total) by mouth daily.   docusate sodium 100 MG capsule Commonly known as:  COLACE Take 1 capsule (100 mg total) by mouth 2 (two) times daily.   feeding supplement (ENSURE ENLIVE) Liqd Take 237 mLs by mouth 3 (three) times daily with meals.   levobunolol 0.5 % ophthalmic solution Commonly known as:  BETAGAN Place 1 drop into both eyes 2 (two) times daily.   metoprolol tartrate 25 MG tablet Commonly known as:  LOPRESSOR Take 2 tablets (50 mg total) by mouth daily.   oxyCODONE-acetaminophen 5-325 MG tablet Commonly known as:  PERCOCET/ROXICET Take 1 tablet by mouth every 6 (six) hours as needed for severe pain.   pantoprazole 40 MG tablet Commonly known as:  PROTONIX Take 1 tablet (40 mg total) by mouth 2 (two) times daily.   tolvaptan 15 MG Tabs tablet Commonly known as:  SAMSCA Take 1 tablet (15 mg total) by mouth daily.       Review of Systems  Constitutional: Negative for activity change, appetite change, chills, diaphoresis and fever.  HENT: Negative for congestion, sneezing, sore throat, trouble swallowing and voice change.   Eyes: Negative for pain, redness and visual disturbance.  Respiratory: Negative for apnea, cough, choking, chest  tightness, shortness of breath and wheezing.   Cardiovascular: Negative for chest pain, palpitations and leg swelling.  Gastrointestinal: Negative for abdominal distention, abdominal pain, constipation, diarrhea and nausea.  Genitourinary: Negative for difficulty urinating, dysuria, frequency and urgency.  Musculoskeletal: Negative for back pain, gait problem and myalgias. Arthralgias: typical arthritis.  Skin: Negative for color change, pallor, rash and wound.  Neurological: Negative for dizziness, tremors, syncope, speech  difficulty, weakness, numbness and headaches.  Psychiatric/Behavioral: Negative for agitation and behavioral problems.  All other systems reviewed and are negative.    There is no immunization history on file for this patient. Pertinent  Health Maintenance Due  Topic Date Due  . DEXA SCAN  04/23/1981  . PNA vac Low Risk Adult (1 of 2 - PCV13) 04/23/1981  . INFLUENZA VACCINE  07/12/2016   No flowsheet data found. Functional Status Survey:    Vitals:   08/23/16 0500  BP: (!) 151/56  Pulse: 61  Resp: 18  Temp: 97.6 F (36.4 C)  SpO2: 99%  Weight: 100 lb 8 oz (45.6 kg)   Body mass index is 17.8 kg/m. Physical Exam  Constitutional: She is oriented to person, place, and time. Vital signs are normal. She appears well-developed and well-nourished. She is active and cooperative. She does not appear ill. No distress.  HENT:  Head: Normocephalic and atraumatic.  Mouth/Throat: Uvula is midline, oropharynx is clear and moist and mucous membranes are normal. Mucous membranes are not pale, not dry and not cyanotic.  Eyes: Conjunctivae, EOM and lids are normal. Pupils are equal, round, and reactive to light.  Neck: Trachea normal, normal range of motion and full passive range of motion without pain. Neck supple. No JVD present. No tracheal deviation, no edema and no erythema present. No thyromegaly present.  Cardiovascular: Normal rate, regular rhythm, normal heart sounds, intact distal pulses and normal pulses.  Exam reveals no gallop and no distant heart sounds.   Pulmonary/Chest: Effort normal. No accessory muscle usage. No respiratory distress. She has no wheezes. She has no rales. She exhibits no tenderness.  Abdominal: Normal appearance and bowel sounds are normal. She exhibits no distension and no ascites. There is no tenderness.  Musculoskeletal: Normal range of motion. She exhibits no edema or tenderness.  Expected osteoarthritis, stiffness  Neurological: She is alert and oriented  to person, place, and time. She has normal strength.  Skin: Skin is warm, dry and intact. No rash noted. She is not diaphoretic. No cyanosis or erythema. No pallor. Nails show no clubbing.  Psychiatric: She has a normal mood and affect. Her speech is normal and behavior is normal. Judgment and thought content normal. Cognition and memory are normal.  Nursing note and vitals reviewed.   Labs reviewed:  Recent Labs  07/30/16 0454 08/04/16 1155 08/16/16 0803  NA 132* 130* 138  K 3.7 4.3 4.1  CL 101 97* 107  CO2 24 27 25   GLUCOSE 113* 132* 97  BUN 19 47* 44*  CREATININE 0.60 0.56 0.65  CALCIUM 8.3* 8.8* 8.6*    Recent Labs  12/14/15 1308 05/20/16 1419 07/29/16 1348  AST 26 18 29   ALT 16 10 20   ALKPHOS 63 127* 88  BILITOT 0.8 0.4 0.6  PROT 6.6 7.4 6.9  ALBUMIN 3.6 4.3 3.8    Recent Labs  05/20/16 1419 07/29/16 1348 07/30/16 0454 08/04/16 1155  WBC 9.6 12.8* 8.1 10.7  NEUTROABS 5.1 10.1*  --  7.3*  HGB  --  11.4* 11.1* 10.8*  HCT 39.0  34.5* 32.6* 32.3*  MCV 85 81.5 81.9 81.9  PLT 318 229 169 280   Lab Results  Component Value Date   TSH 5.720 (H) 05/20/2016   No results found for: HGBA1C No results found for: CHOL, HDL, LDLCALC, LDLDIRECT, TRIG, CHOLHDL  Significant Diagnostic Results in last 30 days:  Dg Chest 2 View  Result Date: 07/29/2016 CLINICAL DATA:  Fall. EXAM: CHEST  2 VIEW COMPARISON:  None. FINDINGS: The heart is mildly enlarged. Interstitial coarsening is chronic. Emphysematous changes are again noted. There is no edema or effusion to suggest failure. Atherosclerotic changes are noted in the thoracic aorta. Remote right-sided rib fracture is evident. There may be a new right-side rib fracture as well. More likely, this was present previously but not as well seen. IMPRESSION: 1. Cardiomegaly without failure. 2. Emphysema. 3. Atherosclerosis of the thoracic aorta. 4. Right-sided rib fractures are likely remote. Please correlate with acute tenderness in  the right chest. Electronically Signed   By: San Morelle M.D.   On: 07/29/2016 13:56   Ct Head Wo Contrast  Result Date: 07/29/2016 CLINICAL DATA:  [place] getting out of a chair, struck head on cabinet EXAM: CT HEAD WITHOUT CONTRAST TECHNIQUE: Contiguous axial images were obtained from the base of the skull through the vertex without intravenous contrast. COMPARISON:  12/16/2015 FINDINGS: LEFT orbital prosthesis. Generalized atrophy. Normal ventricular morphology. No midline shift or mass effect. Small vessel chronic ischemic changes of deep cerebral white matter. No intracranial hemorrhage, mass lesion, or evidence of acute infarction. No extra-axial fluid collections. Chronic opacification of RIGHT maxillary sinus, RIGHT mastoid air cells, and RIGHT frontal sinus. Atherosclerotic calcification of internal carotid arteries at carotid siphons. Skull intact. IMPRESSION: Atrophy with small vessel chronic ischemic changes of deep cerebral white matter. No acute intracranial abnormalities. Chronic RIGHT side sinus disease changes as above. Electronically Signed   By: Lavonia Dana M.D.   On: 07/29/2016 16:30   Ct Pelvis Wo Contrast  Result Date: 07/29/2016 CLINICAL DATA:  Fall.  Left hip pain. EXAM: CT PELVIS WITHOUT CONTRAST TECHNIQUE: Multidetector CT imaging of the pelvis was performed following the standard protocol without intravenous contrast. COMPARISON:  Plain films earlier today. FINDINGS: There is a comminuted fracture noted through the left acetabulum, extending superiorly into the left iliac bone and also into the left superior acetabulum. Probable old superior and inferior acetabular fractures on the left. Hardware noted within the proximal left femur from prior injury and surgery. Degenerative changes in the lower lumbar spine. Grade 1 anterolisthesis of L4 on L5. IMPRESSION: Displaced comminuted fracture through the left acetabulum involving the left superior pubic ramus and left iliac bone.  Electronically Signed   By: Rolm Baptise M.D.   On: 07/29/2016 16:33   Dg Hip Unilat W Or Wo Pelvis 2-3 Views Left  Addendum Date: 07/29/2016   ADDENDUM REPORT: 07/29/2016 17:21 ADDENDUM: After review of the pelvic CT, the left acetabular protrusio is associated with a comminuted left acetabular fracture. This is difficult to appreciate on the radiographs due to the osteopenia and old traumatic changes. In addition, there is a fracture of the left ilium. Refer to the pelvic CT dated 07/29/2016. Electronically Signed   By: Markus Daft M.D.   On: 07/29/2016 17:21   Result Date: 07/29/2016 CLINICAL DATA:  Weakness.  Fell with pain in the posterior left hip. EXAM: DG HIP (WITH OR WITHOUT PELVIS) 2-3V LEFT COMPARISON:  09/13/2014 FINDINGS: Again noted is a dynamic hip screw in the proximal left femur.  Old fractures involving the left superior and inferior pubic rami. Patient has developed significant left acetabular protrusio since the previous examination. The left hip appears to be located. Right pubic rami appear to be intact. No gross abnormality to the right hip joint. 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 No acute bone abnormality identified. Electronically Signed: By: Markus Daft M.D. On: 07/29/2016 13:54    Assessment/Plan 1. Acute postoperative pain   Continue complementary therapies including:  PT/OT  Restorative Nursing  Ice pack to site QID and prn  Diversional activities  Repositioning Q2 hours and prn  Prescription written and forwarded to pharmacy for:   Continue Percocet 5/325- 1 tablet po TID scheduled- hold for sedation  Continue Percocet 5/325- 1 tablet po Q 6 hrs prn  2. Age related osteoporosis with current pathological fracture of left femur with routine healing   Continue working with physical/occupational therapies  Out of bed to chair as much as possible  Pain control for improved healing  Cholecalciferol  4,000 units daily   Family/ staff Communication:   Total Time:  Documentation:  Face to Face:  Family/Phone:   Labs/tests ordered:    Medication list reviewed and assessed for continued appropriateness. It is my medical opinion discontinuation of medications would result in decompensation and decreased quality of life.   Vikki Ports, NP-C Geriatrics [facility] [phone], [place] [address] Cell Phone (Mon-Fri 8am-5pm):  [phone] On Call:  [phone] & follow prompts after 5pm & weekends Office Phone:  [phone] Office Fax:  [phone]

## 2016-09-02 ENCOUNTER — Non-Acute Institutional Stay (SKILLED_NURSING_FACILITY): Payer: Medicare Other | Admitting: Gerontology

## 2016-09-02 DIAGNOSIS — M80052D Age-related osteoporosis with current pathological fracture, left femur, subsequent encounter for fracture with routine healing: Secondary | ICD-10-CM | POA: Diagnosis not present

## 2016-09-02 DIAGNOSIS — G8918 Other acute postprocedural pain: Secondary | ICD-10-CM | POA: Diagnosis not present

## 2016-09-06 NOTE — Progress Notes (Signed)
Location:      Place of Service:  SNF (31) Provider:  Toni Arthurs, NP-C  Wilhemena Durie, MD  Patient Care Team: Jerrol Banana., MD as PCP - General [facility] Medicine)  Extended Emergency Contact Information Primary Emergency Contact: Myrtie Cruise A Address: [address]          [place], [place] [address] Johnnette Litter of [place] Phone: [phone] Work Phone: [phone] Relation: Son Secondary Emergency Contact: [place] of [place] Mobile Phone: [phone] Relation: Daughter  Code Status:  dnr Goals of care: Advanced Directive information Advanced Directives 07/29/2016  Does patient have an advance directive? Yes  Type of Paramedic of [place];Living will  Does patient want to make changes to advanced directive? No - Patient declined  Copy of advanced directive(s) in chart? Yes     Chief Complaint  Patient presents with  . Follow-up    HPI:  Pt is a 82 y.o. female seen today for Follow-up  of controlled substances used to control pain as a result of left femoral neck fracture. The patient describes their pain as well controlled on current regimen. She is on Percocet 5/325 by mouth 3 times a day scheduled with 1 tab by mouth every 6 hours when necessary for breakthrough pain. She has used minimal when necessary meds. Average pain score is 2/10. Other modalities used to control symptoms include ice, prn pain regimen. Pt has tried ice, prn pain regimen  in the past with unsatisfactory or incomplete relief of symptoms. She is also on Cholecalciferol 4,000 units daily for bone health. VSS. Pain well controlled. Pt denies n/v/d/f/c/cp/sob/ha/ abd pain/dizziness, etc. No other complaints  Past Medical History:  Diagnosis Date  . Eye infection   . Falls   . Pelvic fracture [facility])    Past Surgical History:  Procedure Laterality Date  . ABDOMINAL HYSTERECTOMY    . CESAREAN SECTION      Allergies    Allergen Reactions  . Risedronate     Other reaction(s): Other (See Comments) GI upset  . Sulfa Antibiotics     Other reaction(s): Rash  . Teriparatide     Other reaction(s): Other (See Comments) GI upset      Medication List       Accurate as of 09/02/16 11:59 PM. Always use your most recent med list.          amLODipine 10 MG tablet Commonly known as:  NORVASC Take 1 tablet (10 mg total) by mouth daily.   docusate sodium 100 MG capsule Commonly known as:  COLACE Take 1 capsule (100 mg total) by mouth 2 (two) times daily.   feeding supplement (ENSURE ENLIVE) Liqd Take 237 mLs by mouth 3 (three) times daily with meals.   levobunolol 0.5 % ophthalmic solution Commonly known as:  BETAGAN Place 1 drop into both eyes 2 (two) times daily.   levothyroxine 50 MCG tablet Commonly known as:  SYNTHROID, LEVOTHROID TAKE 1 TABLET DAILY   metoprolol tartrate 25 MG tablet Commonly known as:  LOPRESSOR Take 2 tablets (50 mg total) by mouth daily.   oxyCODONE-acetaminophen 5-325 MG tablet Commonly known as:  PERCOCET/ROXICET Take 1 tablet by mouth every 6 (six) hours as needed for severe pain.   pantoprazole 40 MG tablet Commonly known as:  PROTONIX Take 1 tablet (40 mg total) by mouth 2 (two) times daily.   ramipril 5 MG capsule Commonly known as:  ALTACE TAKE 1 CAPSULE DAILY   tolvaptan 15  MG Tabs tablet Commonly known as:  SAMSCA Take 1 tablet (15 mg total) by mouth daily.       Review of Systems  Constitutional: Negative for activity change, appetite change, chills, diaphoresis and fever.  HENT: Negative for congestion, sneezing, sore throat, trouble swallowing and voice change.   Eyes: Negative for pain, redness and visual disturbance.  Respiratory: Negative for apnea, cough, choking, chest tightness, shortness of breath and wheezing.   Cardiovascular: Negative for chest pain, palpitations and leg swelling.  Gastrointestinal: Negative for abdominal  distention, abdominal pain, constipation, diarrhea and nausea.  Genitourinary: Negative for difficulty urinating, dysuria, frequency and urgency.  Musculoskeletal: Negative for back pain, gait problem and myalgias. Arthralgias: typical arthritis.  Skin: Negative for color change, pallor, rash and wound.  Neurological: Negative for dizziness, tremors, syncope, speech difficulty, weakness, numbness and headaches.  Psychiatric/Behavioral: Negative for agitation and behavioral problems.  All other systems reviewed and are negative.    There is no immunization history on file for this patient. Pertinent  Health Maintenance Due  Topic Date Due  . DEXA SCAN  04/23/1981  . PNA vac Low Risk Adult (1 of 2 - PCV13) 04/23/1981  . INFLUENZA VACCINE  07/12/2016   No flowsheet data found. Functional Status Survey:    Vitals:   09/06/16 1452  BP: (!) 123/56  Pulse: (!) 59  Resp: 18  Temp: 97.9 F (36.6 C)  SpO2: 96%  Weight: 97 lb 6.4 oz (44.2 kg)   Body mass index is 17.25 kg/m. Physical Exam  Constitutional: She is oriented to person, place, and time. Vital signs are normal. She appears well-developed and well-nourished. She is active and cooperative. She does not appear ill. No distress.  HENT:  Head: Normocephalic and atraumatic.  Mouth/Throat: Uvula is midline, oropharynx is clear and moist and mucous membranes are normal. Mucous membranes are not pale, not dry and not cyanotic.  Eyes: Conjunctivae, EOM and lids are normal. Pupils are equal, round, and reactive to light.  Neck: Trachea normal, normal range of motion and full passive range of motion without pain. Neck supple. No JVD present. No tracheal deviation, no edema and no erythema present. No thyromegaly present.  Cardiovascular: Normal rate, regular rhythm, normal heart sounds, intact distal pulses and normal pulses.  Exam reveals no gallop and no distant heart sounds.   Pulmonary/Chest: Effort normal. No accessory muscle usage.  No respiratory distress. She has no wheezes. She has no rales. She exhibits no tenderness.  Abdominal: Normal appearance and bowel sounds are normal. She exhibits no distension and no ascites. There is no tenderness.  Musculoskeletal: Normal range of motion. She exhibits no edema or tenderness.  Expected osteoarthritis, stiffness  Neurological: She is alert and oriented to person, place, and time. She has normal strength.  Skin: Skin is warm, dry and intact. No rash noted. She is not diaphoretic. No cyanosis or erythema. No pallor. Nails show no clubbing.  Psychiatric: She has a normal mood and affect. Her speech is normal and behavior is normal. Judgment and thought content normal. Cognition and memory are normal.  Nursing note and vitals reviewed.   Labs reviewed:  Recent Labs  07/30/16 0454 08/04/16 1155 08/16/16 0803  NA 132* 130* 138  K 3.7 4.3 4.1  CL 101 97* 107  CO2 24 27 25   GLUCOSE 113* 132* 97  BUN 19 47* 44*  CREATININE 0.60 0.56 0.65  CALCIUM 8.3* 8.8* 8.6*    Recent Labs  12/14/15 1308 05/20/16 1419 07/29/16  1348  AST 26 18 29   ALT 16 10 20   ALKPHOS 63 127* 88  BILITOT 0.8 0.4 0.6  PROT 6.6 7.4 6.9  ALBUMIN 3.6 4.3 3.8    Recent Labs  05/20/16 1419 07/29/16 1348 07/30/16 0454 08/04/16 1155  WBC 9.6 12.8* 8.1 10.7  NEUTROABS 5.1 10.1*  --  7.3*  HGB  --  11.4* 11.1* 10.8*  HCT 39.0 34.5* 32.6* 32.3*  MCV 85 81.5 81.9 81.9  PLT 318 229 169 280   Lab Results  Component Value Date   TSH 5.720 (H) 05/20/2016   No results found for: HGBA1C No results found for: CHOL, HDL, LDLCALC, LDLDIRECT, TRIG, CHOLHDL  Significant Diagnostic Results in last 30 days:  No results found.  Assessment/Plan 1. Acute postoperative pain   Continue complementary therapies including:  PT/OT  Restorative Nursing  Ice pack to site QID and prn  Diversional activities  Repositioning Q2 hours and prn  Prescription written and forwarded to pharmacy for:    Continue Percocet 5/325- 1 tablet po TID scheduled- hold for sedation  Continue Percocet 5/325- 1 tablet po Q 6 hrs prn  2. Age related osteoporosis with current pathological fracture of left femur with routine healing   Continue working with physical/occupational therapies  Out of bed to chair as much as possible  Pain control for improved healing  Continue Cholecalciferol 4,000 units daily   Family/ staff Communication:   Total Time:  Documentation:  Face to Face:  Family/Phone:   Labs/tests ordered:    Medication list reviewed and assessed for continued appropriateness. It is my medical opinion discontinuation of medications would result in decompensation and decreased quality of life.   Vikki Ports, NP-C Geriatrics [facility] [phone], [place] [address] Cell Phone (Mon-Fri 8am-5pm):  [phone] On Call:  [phone] & follow prompts after 5pm & weekends Office Phone:  [phone] Office Fax:  [phone]

## 2016-09-11 ENCOUNTER — Encounter
Admission: RE | Admit: 2016-09-11 | Discharge: 2016-09-11 | Disposition: A | Discharge status: Home / Self Care | Payer: Medicare Other | Source: Ambulatory Visit | Attending: Internal Medicine | Admitting: Internal Medicine

## 2016-09-11 DIAGNOSIS — H10401 Unspecified chronic conjunctivitis, right eye: Secondary | ICD-10-CM | POA: Insufficient documentation

## 2016-09-12 ENCOUNTER — Non-Acute Institutional Stay (SKILLED_NURSING_FACILITY): Payer: Medicare Other | Admitting: Gerontology

## 2016-09-12 DIAGNOSIS — M80052D Age-related osteoporosis with current pathological fracture, left femur, subsequent encounter for fracture with routine healing: Secondary | ICD-10-CM | POA: Diagnosis not present

## 2016-09-12 DIAGNOSIS — G8918 Other acute postprocedural pain: Secondary | ICD-10-CM

## 2016-09-12 DIAGNOSIS — S32511D Fracture of superior rim of right pubis, subsequent encounter for fracture with routine healing: Secondary | ICD-10-CM | POA: Diagnosis not present

## 2016-09-13 ENCOUNTER — Other Ambulatory Visit
Admission: RE | Admit: 2016-09-13 | Discharge: 2016-09-13 | Disposition: A | Discharge status: Home / Self Care | Payer: No Typology Code available for payment source | Source: Ambulatory Visit | Attending: Ophthalmology | Admitting: Ophthalmology

## 2016-09-13 DIAGNOSIS — H10501 Unspecified blepharoconjunctivitis, right eye: Secondary | ICD-10-CM | POA: Insufficient documentation

## 2016-09-13 DIAGNOSIS — X58XXXA Exposure to other specified factors, initial encounter: Secondary | ICD-10-CM | POA: Insufficient documentation

## 2016-09-13 DIAGNOSIS — H10401 Unspecified chronic conjunctivitis, right eye: Secondary | ICD-10-CM | POA: Diagnosis not present

## 2016-09-13 DIAGNOSIS — S058X9A Other injuries of unspecified eye and orbit, initial encounter: Secondary | ICD-10-CM | POA: Insufficient documentation

## 2016-09-13 NOTE — Progress Notes (Signed)
Location:      Place of Service:  SNF (31) Provider:  Toni Arthurs, NP-C  Wilhemena Durie, MD  Patient Care Team: Jerrol Banana., MD as PCP - General [facility])  Extended Emergency Contact Information Primary Emergency Contact: Myrtie Cruise A Address: [address]          [place], [place] [address] Johnnette Litter of [place] Phone: [phone] Work Phone: [phone] Relation: Son Secondary Emergency Contact: [place] of [place] Mobile Phone: [phone] Relation: Daughter  Code Status:  dnr Goals of care: Advanced Directive information Advanced Directives 07/29/2016  Does patient have an advance directive? Yes  Type of Paramedic of [place];Living will  Does patient want to make changes to advanced directive? No - Patient declined  Copy of advanced directive(s) in chart? Yes     Chief Complaint  Patient presents with  . Follow-up    HPI:  Pt is a 82 y.o. female seen today for Follow-up  of controlled substances used to control pain as a result of left femoral neck fracture. The patient describes their pain as well controlled on current regimen. She is on Percocet 5/325 by mouth 3 times a day scheduled with 1 tab by mouth every 6 hours when necessary for breakthrough pain. She has used minimal when necessary meds. Average pain score is 2/10. Other modalities used to control symptoms include ice, prn pain regimen. Pt has tried ice, prn pain regimen  in the past with unsatisfactory or incomplete relief of symptoms. She is also on Cholecalciferol 4,000 units daily for bone health. VSS. Pain well controlled. Pt denies n/v/d/f/c/cp/sob/ha/ abd pain/dizziness, etc. No other complaints. Will begin down titration of opioid analgesics.   Past Medical History:  Diagnosis Date  . Eye infection   . Falls   . Pelvic fracture [facility])    Past Surgical History:  Procedure Laterality Date  . ABDOMINAL  HYSTERECTOMY    . CESAREAN SECTION      Allergies  Allergen Reactions  . Risedronate     Other reaction(s): Other (See Comments) GI upset  . Sulfa Antibiotics     Other reaction(s): Rash  . Teriparatide     Other reaction(s): Other (See Comments) GI upset      Medication List       Accurate as of 09/12/16 11:59 PM. Always use your most recent med list.          amLODipine 10 MG tablet Commonly known as:  NORVASC Take 1 tablet (10 mg total) by mouth daily.   docusate sodium 100 MG capsule Commonly known as:  COLACE Take 1 capsule (100 mg total) by mouth 2 (two) times daily.   feeding supplement (ENSURE ENLIVE) Liqd Take 237 mLs by mouth 3 (three) times daily with meals.   levobunolol 0.5 % ophthalmic solution Commonly known as:  BETAGAN Place 1 drop into both eyes 2 (two) times daily.   levothyroxine 50 MCG tablet Commonly known as:  SYNTHROID, LEVOTHROID TAKE 1 TABLET DAILY   metoprolol tartrate 25 MG tablet Commonly known as:  LOPRESSOR Take 2 tablets (50 mg total) by mouth daily.   oxyCODONE-acetaminophen 5-325 MG tablet Commonly known as:  PERCOCET/ROXICET Take 1 tablet by mouth every 6 (six) hours as needed for severe pain.   pantoprazole 40 MG tablet Commonly known as:  PROTONIX Take 1 tablet (40 mg total) by mouth 2 (two) times daily.   ramipril 5 MG capsule Commonly known as:  ALTACE TAKE  1 CAPSULE DAILY   tolvaptan 15 MG Tabs tablet Commonly known as:  SAMSCA Take 1 tablet (15 mg total) by mouth daily.       Review of Systems  Constitutional: Negative for activity change, appetite change, chills, diaphoresis and fever.  HENT: Negative for congestion, sneezing, sore throat, trouble swallowing and voice change.   Eyes: Negative for pain, redness and visual disturbance.  Respiratory: Negative for apnea, cough, choking, chest tightness, shortness of breath and wheezing.   Cardiovascular: Negative for chest pain, palpitations and leg swelling.   Gastrointestinal: Negative for abdominal distention, abdominal pain, constipation, diarrhea and nausea.  Genitourinary: Negative for difficulty urinating, dysuria, frequency and urgency.  Musculoskeletal: Negative for back pain, gait problem and myalgias. Arthralgias: typical arthritis.  Skin: Negative for color change, pallor, rash and wound.  Neurological: Negative for dizziness, tremors, syncope, speech difficulty, weakness, numbness and headaches.  Psychiatric/Behavioral: Negative for agitation and behavioral problems.  All other systems reviewed and are negative.    There is no immunization history on file for this patient. Pertinent  Health Maintenance Due  Topic Date Due  . DEXA SCAN  04/23/1981  . PNA vac Low Risk Adult (1 of 2 - PCV13) 04/23/1981  . INFLUENZA VACCINE  07/12/2016   No flowsheet data found. Functional Status Survey:    Vitals:   09/12/16 0500  BP: (!) 147/75  Pulse: 66  Resp: 18  Temp: 97.6 F (36.4 C)  SpO2: 97%  Weight: 97 lb 12.8 oz (44.4 kg)   Body mass index is 17.32 kg/m. Physical Exam  Constitutional: She is oriented to person, place, and time. Vital signs are normal. She appears well-developed and well-nourished. She is active and cooperative. She does not appear ill. No distress.  HENT:  Head: Normocephalic and atraumatic.  Mouth/Throat: Uvula is midline, oropharynx is clear and moist and mucous membranes are normal. Mucous membranes are not pale, not dry and not cyanotic.  Eyes: Conjunctivae, EOM and lids are normal. Pupils are equal, round, and reactive to light.  Neck: Trachea normal, normal range of motion and full passive range of motion without pain. Neck supple. No JVD present. No tracheal deviation, no edema and no erythema present. No thyromegaly present.  Cardiovascular: Normal rate, regular rhythm, normal heart sounds, intact distal pulses and normal pulses.  Exam reveals no gallop and no distant heart sounds.   Pulmonary/Chest:  Effort normal. No accessory muscle usage. No respiratory distress. She has no wheezes. She has no rales. She exhibits no tenderness.  Abdominal: Normal appearance and bowel sounds are normal. She exhibits no distension and no ascites. There is no tenderness.  Musculoskeletal: Normal range of motion. She exhibits no edema or tenderness.  Expected osteoarthritis, stiffness  Neurological: She is alert and oriented to person, place, and time. She has normal strength.  Skin: Skin is warm, dry and intact. No rash noted. She is not diaphoretic. No cyanosis or erythema. No pallor. Nails show no clubbing.  Psychiatric: She has a normal mood and affect. Her speech is normal and behavior is normal. Judgment and thought content normal. Cognition and memory are normal.  Nursing note and vitals reviewed.   Labs reviewed:  Recent Labs  07/30/16 0454 08/04/16 1155 08/16/16 0803  NA 132* 130* 138  K 3.7 4.3 4.1  CL 101 97* 107  CO2 24 27 25   GLUCOSE 113* 132* 97  BUN 19 47* 44*  CREATININE 0.60 0.56 0.65  CALCIUM 8.3* 8.8* 8.6*    Recent Labs  12/14/15 1308 05/20/16 1419 07/29/16 1348  AST 26 18 29   ALT 16 10 20   ALKPHOS 63 127* 88  BILITOT 0.8 0.4 0.6  PROT 6.6 7.4 6.9  ALBUMIN 3.6 4.3 3.8    Recent Labs  05/20/16 1419 07/29/16 1348 07/30/16 0454 08/04/16 1155  WBC 9.6 12.8* 8.1 10.7  NEUTROABS 5.1 10.1*  --  7.3*  HGB  --  11.4* 11.1* 10.8*  HCT 39.0 34.5* 32.6* 32.3*  MCV 85 81.5 81.9 81.9  PLT 318 229 169 280   Lab Results  Component Value Date   TSH 5.720 (H) 05/20/2016   No results found for: HGBA1C No results found for: CHOL, HDL, LDLCALC, LDLDIRECT, TRIG, CHOLHDL  Significant Diagnostic Results in last 30 days:  No results found.  Assessment/Plan 1. Acute postoperative pain   Continue complementary therapies including:  PT/OT  Restorative Nursing  Ice pack to site QID and prn  Diversional activities  Repositioning Q2 hours and prn  Prescription  written and forwarded to pharmacy for:   Continue Percocet 5/325- 1 tablet po BID scheduled- hold for sedation  Continue Percocet 5/325- 1 tablet po Q 6 hrs prn  2. Age related osteoporosis with current pathological fracture of left femur with routine healing   Continue working with physical/occupational therapies  Out of bed to chair as much as possible  Pain control for improved healing  Continue Cholecalciferol 4,000 units daily   Family/ staff Communication:   Total Time:  Documentation:  Face to Face:  Family/Phone:   Labs/tests ordered:    Medication list reviewed and assessed for continued appropriateness. It is my medical opinion discontinuation of medications would result in decompensation and decreased quality of life.   Vikki Ports, NP-C Geriatrics [facility] [phone], [place] [address] Cell Phone (Mon-Fri 8am-5pm):  [phone] On Call:  [phone] & follow prompts after 5pm & weekends Office Phone:  [phone] Office Fax:  [phone]

## 2016-09-16 LAB — AEROBIC CULTURE W GRAM STAIN (SUPERFICIAL SPECIMEN)

## 2016-09-16 LAB — AEROBIC CULTURE  (SUPERFICIAL SPECIMEN)

## 2016-09-21 ENCOUNTER — Non-Acute Institutional Stay (SKILLED_NURSING_FACILITY): Payer: Medicare Other | Admitting: Gerontology

## 2016-09-21 DIAGNOSIS — M80052D Age-related osteoporosis with current pathological fracture, left femur, subsequent encounter for fracture with routine healing: Secondary | ICD-10-CM | POA: Diagnosis not present

## 2016-09-21 DIAGNOSIS — G8918 Other acute postprocedural pain: Secondary | ICD-10-CM | POA: Diagnosis not present

## 2016-09-26 ENCOUNTER — Telehealth: Payer: Self-pay | Admitting: Family Medicine

## 2016-09-26 NOTE — Telephone Encounter (Signed)
Pt's daughter Marcellina Millin [name]) stated that she is pt's medical POA request a nurse return her call to discuss pantoprazole (PROTONIX) 40 MG tablet. Beverlea [name]) stated there has been some discrepancies and she would like to discuss this with a nurse. Please advise. Thanks TNP

## 2016-09-26 NOTE — Telephone Encounter (Signed)
Spoke with the patient's daughter and she reports that the patient has had bloody stools for the last 2 days and wonders if it is because she has not been taking the Protonix. She is unaware of how long she has not been taking it, but she just realized today that she found several expired bottles of Protonix in her room. Could this be the cause of her bloody stools? And should she continue taking Protonix? Please advise. Thanks!   She is also faxing over a form for you to fill out saying that patient needs to continue Protonix or not for her assisted living facility. Thanks!

## 2016-09-27 DIAGNOSIS — W19XXXD Unspecified fall, subsequent encounter: Secondary | ICD-10-CM | POA: Diagnosis not present

## 2016-09-27 DIAGNOSIS — H409 Unspecified glaucoma: Secondary | ICD-10-CM | POA: Diagnosis not present

## 2016-09-27 DIAGNOSIS — I10 Essential (primary) hypertension: Secondary | ICD-10-CM | POA: Diagnosis not present

## 2016-09-27 DIAGNOSIS — Z9181 History of falling: Secondary | ICD-10-CM | POA: Diagnosis not present

## 2016-09-27 DIAGNOSIS — S32402D Unspecified fracture of left acetabulum, subsequent encounter for fracture with routine healing: Secondary | ICD-10-CM | POA: Diagnosis not present

## 2016-09-27 DIAGNOSIS — Z970 Presence of artificial eye: Secondary | ICD-10-CM | POA: Diagnosis not present

## 2016-09-29 DIAGNOSIS — H409 Unspecified glaucoma: Secondary | ICD-10-CM | POA: Diagnosis not present

## 2016-09-29 DIAGNOSIS — Z970 Presence of artificial eye: Secondary | ICD-10-CM | POA: Diagnosis not present

## 2016-09-29 DIAGNOSIS — W19XXXD Unspecified fall, subsequent encounter: Secondary | ICD-10-CM | POA: Diagnosis not present

## 2016-09-29 DIAGNOSIS — Z9181 History of falling: Secondary | ICD-10-CM | POA: Diagnosis not present

## 2016-09-29 DIAGNOSIS — I10 Essential (primary) hypertension: Secondary | ICD-10-CM | POA: Diagnosis not present

## 2016-09-29 DIAGNOSIS — S32402D Unspecified fracture of left acetabulum, subsequent encounter for fracture with routine healing: Secondary | ICD-10-CM | POA: Diagnosis not present

## 2016-10-03 NOTE — Telephone Encounter (Signed)
Spoke with [name] she states they received a fax in regards to stool issues patient was having but that has resolved by the time fax was faxed to them-aa

## 2016-10-03 NOTE — Telephone Encounter (Signed)
[name] with [facility] is called to verify updates from a fax rec'd today that was dated last Monday.  CB#[phone]/MW

## 2016-10-03 NOTE — Telephone Encounter (Signed)
I made a home visit for patient at [facility]. Normal exam. No further evaluation at age 82. Stool problem has resolved.

## 2016-10-04 DIAGNOSIS — W19XXXD Unspecified fall, subsequent encounter: Secondary | ICD-10-CM | POA: Diagnosis not present

## 2016-10-04 DIAGNOSIS — I10 Essential (primary) hypertension: Secondary | ICD-10-CM | POA: Diagnosis not present

## 2016-10-04 DIAGNOSIS — Z9181 History of falling: Secondary | ICD-10-CM | POA: Diagnosis not present

## 2016-10-04 DIAGNOSIS — S32402D Unspecified fracture of left acetabulum, subsequent encounter for fracture with routine healing: Secondary | ICD-10-CM | POA: Diagnosis not present

## 2016-10-04 DIAGNOSIS — Z970 Presence of artificial eye: Secondary | ICD-10-CM | POA: Diagnosis not present

## 2016-10-04 DIAGNOSIS — H409 Unspecified glaucoma: Secondary | ICD-10-CM | POA: Diagnosis not present

## 2016-10-05 DIAGNOSIS — S32402D Unspecified fracture of left acetabulum, subsequent encounter for fracture with routine healing: Secondary | ICD-10-CM | POA: Diagnosis not present

## 2016-10-05 DIAGNOSIS — I10 Essential (primary) hypertension: Secondary | ICD-10-CM | POA: Diagnosis not present

## 2016-10-05 DIAGNOSIS — Z9181 History of falling: Secondary | ICD-10-CM | POA: Diagnosis not present

## 2016-10-05 DIAGNOSIS — Z970 Presence of artificial eye: Secondary | ICD-10-CM | POA: Diagnosis not present

## 2016-10-05 DIAGNOSIS — W19XXXD Unspecified fall, subsequent encounter: Secondary | ICD-10-CM | POA: Diagnosis not present

## 2016-10-05 DIAGNOSIS — H409 Unspecified glaucoma: Secondary | ICD-10-CM | POA: Diagnosis not present

## 2016-10-06 ENCOUNTER — Telehealth: Payer: Self-pay | Admitting: Family Medicine

## 2016-10-06 DIAGNOSIS — W19XXXD Unspecified fall, subsequent encounter: Secondary | ICD-10-CM | POA: Diagnosis not present

## 2016-10-06 DIAGNOSIS — S32402D Unspecified fracture of left acetabulum, subsequent encounter for fracture with routine healing: Secondary | ICD-10-CM | POA: Diagnosis not present

## 2016-10-06 DIAGNOSIS — Z9181 History of falling: Secondary | ICD-10-CM | POA: Diagnosis not present

## 2016-10-06 DIAGNOSIS — I10 Essential (primary) hypertension: Secondary | ICD-10-CM | POA: Diagnosis not present

## 2016-10-06 DIAGNOSIS — H409 Unspecified glaucoma: Secondary | ICD-10-CM | POA: Diagnosis not present

## 2016-10-06 DIAGNOSIS — Z970 Presence of artificial eye: Secondary | ICD-10-CM | POA: Diagnosis not present

## 2016-10-06 NOTE — Telephone Encounter (Signed)
Marlowe Kays with [facility] request a verbal order for occupational therapy, 2 times a week for 6 weeks.  [id number]

## 2016-10-07 NOTE — Telephone Encounter (Signed)
Please review-aa 

## 2016-10-07 NOTE — Progress Notes (Signed)
Location:      Place of Service:  SNF (31) Provider:  Toni Arthurs, NP-C  Wilhemena Durie, MD  Patient Care Team: Jerrol Banana., MD as PCP - General [facility] Medicine)  Extended Emergency Contact Information Primary Emergency Contact: Myrtie Cruise A Address: [address]          [place], [place] [address] Johnnette Litter of [place] Phone: [phone] Work Phone: [phone] Relation: Son Secondary Emergency Contact: [place] of [place] Mobile Phone: [phone] Relation: Daughter  Code Status:  dnr Goals of care: Advanced Directive information Advanced Directives 07/29/2016  Does patient have an advance directive? Yes  Type of Paramedic of [place];Living will  Does patient want to make changes to advanced directive? No - Patient declined  Copy of advanced directive(s) in chart? Yes     Chief Complaint  Patient presents with  . Discharge Note    HPI:  Pt is a 82 y.o. female seen today for discharge evaluation. Pt was admitted to the facility for rehab following pelvic fracture. Pt participated with PT/OT and progressed well. Labs have remained stable. VSS. Pain well controlled. Pt denies n/v/d/f/c/cp/sob/ha/ abd pain/dizziness, etc. No other complaints. Discharge to [facility] with family  Past Medical History:  Diagnosis Date  . Eye infection   . Falls   . Pelvic fracture [facility])    Past Surgical History:  Procedure Laterality Date  . ABDOMINAL HYSTERECTOMY    . CESAREAN SECTION      Allergies  Allergen Reactions  . Risedronate     Other reaction(s): Other (See Comments) GI upset  . Sulfa Antibiotics     Other reaction(s): Rash  . Teriparatide     Other reaction(s): Other (See Comments) GI upset      Medication List       Accurate as of 09/21/16 11:59 PM. Always use your most recent med list.          amLODipine 10 MG tablet Commonly known as:  NORVASC Take 1 tablet (10  mg total) by mouth daily.   docusate sodium 100 MG capsule Commonly known as:  COLACE Take 1 capsule (100 mg total) by mouth 2 (two) times daily.   feeding supplement (ENSURE ENLIVE) Liqd Take 237 mLs by mouth 3 (three) times daily with meals.   levobunolol 0.5 % ophthalmic solution Commonly known as:  BETAGAN Place 1 drop into both eyes 2 (two) times daily.   levothyroxine 50 MCG tablet Commonly known as:  SYNTHROID, LEVOTHROID TAKE 1 TABLET DAILY   metoprolol tartrate 25 MG tablet Commonly known as:  LOPRESSOR Take 2 tablets (50 mg total) by mouth daily.   oxyCODONE-acetaminophen 5-325 MG tablet Commonly known as:  PERCOCET/ROXICET Take 1 tablet by mouth every 6 (six) hours as needed for severe pain.   pantoprazole 40 MG tablet Commonly known as:  PROTONIX Take 1 tablet (40 mg total) by mouth 2 (two) times daily.   ramipril 5 MG capsule Commonly known as:  ALTACE TAKE 1 CAPSULE DAILY   tolvaptan 15 MG Tabs tablet Commonly known as:  SAMSCA Take 1 tablet (15 mg total) by mouth daily.       Review of Systems  Constitutional: Negative for activity change, appetite change, chills, diaphoresis and fever.  HENT: Negative for congestion, sneezing, sore throat, trouble swallowing and voice change.   Eyes: Negative for pain, redness and visual disturbance.  Respiratory: Negative for apnea, cough, choking, chest tightness, shortness of breath and  wheezing.   Cardiovascular: Negative for chest pain, palpitations and leg swelling.  Gastrointestinal: Negative for abdominal distention, abdominal pain, constipation, diarrhea and nausea.  Genitourinary: Negative for difficulty urinating, dysuria, frequency and urgency.  Musculoskeletal: Positive for arthralgias (typical arthritis). Negative for back pain, gait problem and myalgias.  Skin: Negative for color change, pallor, rash and wound.  Neurological: Negative for dizziness, tremors, syncope, speech difficulty, weakness,  numbness and headaches.  Psychiatric/Behavioral: Negative for agitation and behavioral problems.  All other systems reviewed and are negative.    There is no immunization history on file for this patient. Pertinent  Health Maintenance Due  Topic Date Due  . DEXA SCAN  04/23/1981  . PNA vac Low Risk Adult (1 of 2 - PCV13) 04/23/1981  . INFLUENZA VACCINE  07/12/2016   No flowsheet data found. Functional Status Survey:    Vitals:   09/21/16 0600  BP: 130/63  Pulse: 62  Resp: 16  Temp: 98 F (36.7 C)  SpO2: 95%  Weight: 96 lb (43.5 kg)   Body mass index is 17.01 kg/m. Physical Exam  Constitutional: She is oriented to person, place, and time. Vital signs are normal. She appears well-developed and well-nourished. She is active and cooperative. She does not appear ill. No distress.  HENT:  Head: Normocephalic and atraumatic.  Mouth/Throat: Uvula is midline, oropharynx is clear and moist and mucous membranes are normal. Mucous membranes are not pale, not dry and not cyanotic.  Eyes: Conjunctivae, EOM and lids are normal. Pupils are equal, round, and reactive to light.  Neck: Trachea normal, normal range of motion and full passive range of motion without pain. Neck supple. No JVD present. No tracheal deviation, no edema and no erythema present. No thyromegaly present.  Cardiovascular: Normal rate, regular rhythm, normal heart sounds, intact distal pulses and normal pulses.  Exam reveals no gallop, no distant heart sounds and no friction rub.   Pulmonary/Chest: Effort normal. No accessory muscle usage. No respiratory distress. She has no wheezes. She has no rales. She exhibits no tenderness.  Abdominal: Normal appearance and bowel sounds are normal. She exhibits no distension and no ascites. There is no tenderness.  Musculoskeletal: Normal range of motion. She exhibits no edema or tenderness.  Expected osteoarthritis, stiffness  Neurological: She is alert and oriented to person, place,  and time. She has normal strength.  Skin: Skin is warm, dry and intact. No rash noted. She is not diaphoretic. No cyanosis or erythema. No pallor. Nails show no clubbing.  Psychiatric: She has a normal mood and affect. Her speech is normal and behavior is normal. Judgment and thought content normal. Cognition and memory are normal.  Nursing note and vitals reviewed.   Labs reviewed:  Recent Labs  07/30/16 0454 08/04/16 1155 08/16/16 0803  NA 132* 130* 138  K 3.7 4.3 4.1  CL 101 97* 107  CO2 24 27 25   GLUCOSE 113* 132* 97  BUN 19 47* 44*  CREATININE 0.60 0.56 0.65  CALCIUM 8.3* 8.8* 8.6*    Recent Labs  12/14/15 1308 05/20/16 1419 07/29/16 1348  AST 26 18 29   ALT 16 10 20   ALKPHOS 63 127* 88  BILITOT 0.8 0.4 0.6  PROT 6.6 7.4 6.9  ALBUMIN 3.6 4.3 3.8    Recent Labs  05/20/16 1419 07/29/16 1348 07/30/16 0454 08/04/16 1155  WBC 9.6 12.8* 8.1 10.7  NEUTROABS 5.1 10.1*  --  7.3*  HGB  --  11.4* 11.1* 10.8*  HCT 39.0 34.5* 32.6* 32.3*  MCV 85 81.5 81.9 81.9  PLT 318 229 169 280   Lab Results  Component Value Date   TSH 5.720 (H) 05/20/2016   No results found for: HGBA1C No results found for: CHOL, HDL, LDLCALC, LDLDIRECT, TRIG, CHOLHDL  Significant Diagnostic Results in last 30 days:  No results found.  Assessment/Plan 1. Acute postoperative pain   Continue complementary therapies including:  PT/OT  Restorative Nursing  Ice pack to site QID and prn  Diversional activities  Repositioning Q2 hours and prn  Prescription written and forwarded to pharmacy for:   Continue Percocet 5/325- 1 tablet po BID scheduled- hold for sedation  Continue Percocet 5/325- 1 tablet po Q 6 hrs prn  2. Age related osteoporosis with current pathological fracture of left femur with routine healing   Continue working with physical/occupational therapies  Out of bed to chair as much as possible  Pain control for improved healing  Continue Cholecalciferol 4,000  units daily    Patient is being discharged with the following home health services:  none  Patient is being discharged with the following durable medical equipment:  none  Patient has been advised to f/u with their PCP in 1-2 weeks to bring them up to date on their rehab stay.  Social services at facility was responsible for arranging this appointment.  Pt was provided with a 30 day supply of prescriptions for medications and refills must be obtained from their PCP.  For controlled substances, a more limited supply may be provided adequate until PCP appointment only.  Family/ staff Communication:   Total Time:  Documentation:  Face to Face:  Family/Phone:   Labs/tests ordered:    Medication list reviewed and assessed for continued appropriateness. It is my medical opinion discontinuation of medications would result in decompensation and decreased quality of life.   Vikki Ports, NP-C Geriatrics [facility] [phone], [place] [address] Cell Phone (Mon-Fri 8am-5pm):  [phone] On Call:  [phone] & follow prompts after 5pm & weekends Office Phone:  [phone] Office Fax:  [phone]

## 2016-10-09 NOTE — Telephone Encounter (Signed)
ok 

## 2016-10-10 DIAGNOSIS — H10401 Unspecified chronic conjunctivitis, right eye: Secondary | ICD-10-CM | POA: Diagnosis not present

## 2016-10-10 DIAGNOSIS — H409 Unspecified glaucoma: Secondary | ICD-10-CM | POA: Diagnosis not present

## 2016-10-10 DIAGNOSIS — Z970 Presence of artificial eye: Secondary | ICD-10-CM | POA: Diagnosis not present

## 2016-10-10 DIAGNOSIS — W19XXXD Unspecified fall, subsequent encounter: Secondary | ICD-10-CM | POA: Diagnosis not present

## 2016-10-10 DIAGNOSIS — S32402D Unspecified fracture of left acetabulum, subsequent encounter for fracture with routine healing: Secondary | ICD-10-CM | POA: Diagnosis not present

## 2016-10-10 DIAGNOSIS — Z9181 History of falling: Secondary | ICD-10-CM | POA: Diagnosis not present

## 2016-10-10 DIAGNOSIS — I10 Essential (primary) hypertension: Secondary | ICD-10-CM | POA: Diagnosis not present

## 2016-10-10 NOTE — Telephone Encounter (Signed)
Connie advised on her voicemail-aa 

## 2016-10-11 DIAGNOSIS — Z970 Presence of artificial eye: Secondary | ICD-10-CM | POA: Diagnosis not present

## 2016-10-11 DIAGNOSIS — Z9181 History of falling: Secondary | ICD-10-CM | POA: Diagnosis not present

## 2016-10-11 DIAGNOSIS — S32402D Unspecified fracture of left acetabulum, subsequent encounter for fracture with routine healing: Secondary | ICD-10-CM | POA: Diagnosis not present

## 2016-10-11 DIAGNOSIS — H409 Unspecified glaucoma: Secondary | ICD-10-CM | POA: Diagnosis not present

## 2016-10-11 DIAGNOSIS — W19XXXD Unspecified fall, subsequent encounter: Secondary | ICD-10-CM | POA: Diagnosis not present

## 2016-10-11 DIAGNOSIS — H168 Other keratitis: Secondary | ICD-10-CM | POA: Diagnosis not present

## 2016-10-11 DIAGNOSIS — I10 Essential (primary) hypertension: Secondary | ICD-10-CM | POA: Diagnosis not present

## 2016-10-12 DIAGNOSIS — I10 Essential (primary) hypertension: Secondary | ICD-10-CM | POA: Diagnosis not present

## 2016-10-12 DIAGNOSIS — S32402D Unspecified fracture of left acetabulum, subsequent encounter for fracture with routine healing: Secondary | ICD-10-CM | POA: Diagnosis not present

## 2016-10-12 DIAGNOSIS — W19XXXD Unspecified fall, subsequent encounter: Secondary | ICD-10-CM | POA: Diagnosis not present

## 2016-10-12 DIAGNOSIS — H409 Unspecified glaucoma: Secondary | ICD-10-CM | POA: Diagnosis not present

## 2016-10-12 DIAGNOSIS — Z970 Presence of artificial eye: Secondary | ICD-10-CM | POA: Diagnosis not present

## 2016-10-12 DIAGNOSIS — Z9181 History of falling: Secondary | ICD-10-CM | POA: Diagnosis not present

## 2016-10-13 DIAGNOSIS — W19XXXD Unspecified fall, subsequent encounter: Secondary | ICD-10-CM | POA: Diagnosis not present

## 2016-10-13 DIAGNOSIS — Z9181 History of falling: Secondary | ICD-10-CM | POA: Diagnosis not present

## 2016-10-13 DIAGNOSIS — S32402D Unspecified fracture of left acetabulum, subsequent encounter for fracture with routine healing: Secondary | ICD-10-CM | POA: Diagnosis not present

## 2016-10-13 DIAGNOSIS — I10 Essential (primary) hypertension: Secondary | ICD-10-CM | POA: Diagnosis not present

## 2016-10-13 DIAGNOSIS — H409 Unspecified glaucoma: Secondary | ICD-10-CM | POA: Diagnosis not present

## 2016-10-13 DIAGNOSIS — Z970 Presence of artificial eye: Secondary | ICD-10-CM | POA: Diagnosis not present

## 2016-10-13 LAB — EYE CULTURE

## 2016-10-14 DIAGNOSIS — H16321 Diffuse interstitial keratitis, right eye: Secondary | ICD-10-CM | POA: Diagnosis not present

## 2016-10-14 DIAGNOSIS — H10401 Unspecified chronic conjunctivitis, right eye: Secondary | ICD-10-CM | POA: Diagnosis not present

## 2016-10-17 DIAGNOSIS — H409 Unspecified glaucoma: Secondary | ICD-10-CM | POA: Diagnosis not present

## 2016-10-17 DIAGNOSIS — Z970 Presence of artificial eye: Secondary | ICD-10-CM | POA: Diagnosis not present

## 2016-10-17 DIAGNOSIS — I10 Essential (primary) hypertension: Secondary | ICD-10-CM | POA: Diagnosis not present

## 2016-10-17 DIAGNOSIS — S32402D Unspecified fracture of left acetabulum, subsequent encounter for fracture with routine healing: Secondary | ICD-10-CM | POA: Diagnosis not present

## 2016-10-17 DIAGNOSIS — W19XXXD Unspecified fall, subsequent encounter: Secondary | ICD-10-CM | POA: Diagnosis not present

## 2016-10-17 DIAGNOSIS — Z9181 History of falling: Secondary | ICD-10-CM | POA: Diagnosis not present

## 2016-10-18 DIAGNOSIS — H409 Unspecified glaucoma: Secondary | ICD-10-CM | POA: Diagnosis not present

## 2016-10-18 DIAGNOSIS — Z9181 History of falling: Secondary | ICD-10-CM | POA: Diagnosis not present

## 2016-10-18 DIAGNOSIS — Z970 Presence of artificial eye: Secondary | ICD-10-CM | POA: Diagnosis not present

## 2016-10-18 DIAGNOSIS — I10 Essential (primary) hypertension: Secondary | ICD-10-CM | POA: Diagnosis not present

## 2016-10-18 DIAGNOSIS — H16321 Diffuse interstitial keratitis, right eye: Secondary | ICD-10-CM | POA: Diagnosis not present

## 2016-10-18 DIAGNOSIS — W19XXXD Unspecified fall, subsequent encounter: Secondary | ICD-10-CM | POA: Diagnosis not present

## 2016-10-18 DIAGNOSIS — S32402D Unspecified fracture of left acetabulum, subsequent encounter for fracture with routine healing: Secondary | ICD-10-CM | POA: Diagnosis not present

## 2016-10-19 DIAGNOSIS — S32402D Unspecified fracture of left acetabulum, subsequent encounter for fracture with routine healing: Secondary | ICD-10-CM | POA: Diagnosis not present

## 2016-10-19 DIAGNOSIS — W19XXXD Unspecified fall, subsequent encounter: Secondary | ICD-10-CM | POA: Diagnosis not present

## 2016-10-19 DIAGNOSIS — H409 Unspecified glaucoma: Secondary | ICD-10-CM | POA: Diagnosis not present

## 2016-10-19 DIAGNOSIS — Z970 Presence of artificial eye: Secondary | ICD-10-CM | POA: Diagnosis not present

## 2016-10-19 DIAGNOSIS — Z9181 History of falling: Secondary | ICD-10-CM | POA: Diagnosis not present

## 2016-10-19 DIAGNOSIS — I10 Essential (primary) hypertension: Secondary | ICD-10-CM | POA: Diagnosis not present

## 2016-10-20 DIAGNOSIS — Z9181 History of falling: Secondary | ICD-10-CM | POA: Diagnosis not present

## 2016-10-20 DIAGNOSIS — W19XXXD Unspecified fall, subsequent encounter: Secondary | ICD-10-CM | POA: Diagnosis not present

## 2016-10-20 DIAGNOSIS — Z970 Presence of artificial eye: Secondary | ICD-10-CM | POA: Diagnosis not present

## 2016-10-20 DIAGNOSIS — H409 Unspecified glaucoma: Secondary | ICD-10-CM | POA: Diagnosis not present

## 2016-10-20 DIAGNOSIS — S32402D Unspecified fracture of left acetabulum, subsequent encounter for fracture with routine healing: Secondary | ICD-10-CM | POA: Diagnosis not present

## 2016-10-20 DIAGNOSIS — I10 Essential (primary) hypertension: Secondary | ICD-10-CM | POA: Diagnosis not present

## 2016-10-24 DIAGNOSIS — W19XXXD Unspecified fall, subsequent encounter: Secondary | ICD-10-CM | POA: Diagnosis not present

## 2016-10-24 DIAGNOSIS — Z9181 History of falling: Secondary | ICD-10-CM | POA: Diagnosis not present

## 2016-10-24 DIAGNOSIS — H409 Unspecified glaucoma: Secondary | ICD-10-CM | POA: Diagnosis not present

## 2016-10-24 DIAGNOSIS — Z970 Presence of artificial eye: Secondary | ICD-10-CM | POA: Diagnosis not present

## 2016-10-24 DIAGNOSIS — I10 Essential (primary) hypertension: Secondary | ICD-10-CM | POA: Diagnosis not present

## 2016-10-24 DIAGNOSIS — S32402D Unspecified fracture of left acetabulum, subsequent encounter for fracture with routine healing: Secondary | ICD-10-CM | POA: Diagnosis not present

## 2016-10-25 DIAGNOSIS — Z970 Presence of artificial eye: Secondary | ICD-10-CM | POA: Diagnosis not present

## 2016-10-25 DIAGNOSIS — Z9181 History of falling: Secondary | ICD-10-CM | POA: Diagnosis not present

## 2016-10-25 DIAGNOSIS — S32402D Unspecified fracture of left acetabulum, subsequent encounter for fracture with routine healing: Secondary | ICD-10-CM | POA: Diagnosis not present

## 2016-10-25 DIAGNOSIS — W19XXXD Unspecified fall, subsequent encounter: Secondary | ICD-10-CM | POA: Diagnosis not present

## 2016-10-25 DIAGNOSIS — I10 Essential (primary) hypertension: Secondary | ICD-10-CM | POA: Diagnosis not present

## 2016-10-25 DIAGNOSIS — H409 Unspecified glaucoma: Secondary | ICD-10-CM | POA: Diagnosis not present

## 2016-10-26 DIAGNOSIS — H409 Unspecified glaucoma: Secondary | ICD-10-CM | POA: Diagnosis not present

## 2016-10-26 DIAGNOSIS — W19XXXD Unspecified fall, subsequent encounter: Secondary | ICD-10-CM | POA: Diagnosis not present

## 2016-10-26 DIAGNOSIS — S32511D Fracture of superior rim of right pubis, subsequent encounter for fracture with routine healing: Secondary | ICD-10-CM | POA: Diagnosis not present

## 2016-10-26 DIAGNOSIS — S32402D Unspecified fracture of left acetabulum, subsequent encounter for fracture with routine healing: Secondary | ICD-10-CM | POA: Diagnosis not present

## 2016-10-26 DIAGNOSIS — Z9181 History of falling: Secondary | ICD-10-CM | POA: Diagnosis not present

## 2016-10-26 DIAGNOSIS — Z970 Presence of artificial eye: Secondary | ICD-10-CM | POA: Diagnosis not present

## 2016-10-26 DIAGNOSIS — I10 Essential (primary) hypertension: Secondary | ICD-10-CM | POA: Diagnosis not present

## 2016-10-26 DIAGNOSIS — H18899 Other specified disorders of cornea, unspecified eye: Secondary | ICD-10-CM | POA: Diagnosis not present

## 2016-10-31 DIAGNOSIS — Z970 Presence of artificial eye: Secondary | ICD-10-CM | POA: Diagnosis not present

## 2016-10-31 DIAGNOSIS — W19XXXD Unspecified fall, subsequent encounter: Secondary | ICD-10-CM | POA: Diagnosis not present

## 2016-10-31 DIAGNOSIS — H409 Unspecified glaucoma: Secondary | ICD-10-CM | POA: Diagnosis not present

## 2016-10-31 DIAGNOSIS — S32402D Unspecified fracture of left acetabulum, subsequent encounter for fracture with routine healing: Secondary | ICD-10-CM | POA: Diagnosis not present

## 2016-10-31 DIAGNOSIS — I10 Essential (primary) hypertension: Secondary | ICD-10-CM | POA: Diagnosis not present

## 2016-10-31 DIAGNOSIS — Z9181 History of falling: Secondary | ICD-10-CM | POA: Diagnosis not present

## 2016-11-01 DIAGNOSIS — Z9181 History of falling: Secondary | ICD-10-CM | POA: Diagnosis not present

## 2016-11-01 DIAGNOSIS — W19XXXD Unspecified fall, subsequent encounter: Secondary | ICD-10-CM | POA: Diagnosis not present

## 2016-11-01 DIAGNOSIS — H409 Unspecified glaucoma: Secondary | ICD-10-CM | POA: Diagnosis not present

## 2016-11-01 DIAGNOSIS — S32402D Unspecified fracture of left acetabulum, subsequent encounter for fracture with routine healing: Secondary | ICD-10-CM | POA: Diagnosis not present

## 2016-11-01 DIAGNOSIS — Z970 Presence of artificial eye: Secondary | ICD-10-CM | POA: Diagnosis not present

## 2016-11-01 DIAGNOSIS — H10401 Unspecified chronic conjunctivitis, right eye: Secondary | ICD-10-CM | POA: Diagnosis not present

## 2016-11-01 DIAGNOSIS — I10 Essential (primary) hypertension: Secondary | ICD-10-CM | POA: Diagnosis not present

## 2016-11-02 DIAGNOSIS — H10402 Unspecified chronic conjunctivitis, left eye: Secondary | ICD-10-CM | POA: Diagnosis not present

## 2016-11-04 DIAGNOSIS — W19XXXD Unspecified fall, subsequent encounter: Secondary | ICD-10-CM | POA: Diagnosis not present

## 2016-11-04 DIAGNOSIS — S32402D Unspecified fracture of left acetabulum, subsequent encounter for fracture with routine healing: Secondary | ICD-10-CM | POA: Diagnosis not present

## 2016-11-04 DIAGNOSIS — Z970 Presence of artificial eye: Secondary | ICD-10-CM | POA: Diagnosis not present

## 2016-11-04 DIAGNOSIS — I10 Essential (primary) hypertension: Secondary | ICD-10-CM | POA: Diagnosis not present

## 2016-11-04 DIAGNOSIS — Z9181 History of falling: Secondary | ICD-10-CM | POA: Diagnosis not present

## 2016-11-04 DIAGNOSIS — H409 Unspecified glaucoma: Secondary | ICD-10-CM | POA: Diagnosis not present

## 2016-11-07 DIAGNOSIS — Z9181 History of falling: Secondary | ICD-10-CM | POA: Diagnosis not present

## 2016-11-07 DIAGNOSIS — I10 Essential (primary) hypertension: Secondary | ICD-10-CM | POA: Diagnosis not present

## 2016-11-07 DIAGNOSIS — H409 Unspecified glaucoma: Secondary | ICD-10-CM | POA: Diagnosis not present

## 2016-11-07 DIAGNOSIS — Z970 Presence of artificial eye: Secondary | ICD-10-CM | POA: Diagnosis not present

## 2016-11-07 DIAGNOSIS — W19XXXD Unspecified fall, subsequent encounter: Secondary | ICD-10-CM | POA: Diagnosis not present

## 2016-11-07 DIAGNOSIS — S32402D Unspecified fracture of left acetabulum, subsequent encounter for fracture with routine healing: Secondary | ICD-10-CM | POA: Diagnosis not present

## 2016-11-08 DIAGNOSIS — Z9181 History of falling: Secondary | ICD-10-CM | POA: Diagnosis not present

## 2016-11-08 DIAGNOSIS — I10 Essential (primary) hypertension: Secondary | ICD-10-CM | POA: Diagnosis not present

## 2016-11-08 DIAGNOSIS — Z970 Presence of artificial eye: Secondary | ICD-10-CM | POA: Diagnosis not present

## 2016-11-08 DIAGNOSIS — S32402D Unspecified fracture of left acetabulum, subsequent encounter for fracture with routine healing: Secondary | ICD-10-CM | POA: Diagnosis not present

## 2016-11-08 DIAGNOSIS — W19XXXD Unspecified fall, subsequent encounter: Secondary | ICD-10-CM | POA: Diagnosis not present

## 2016-11-08 DIAGNOSIS — H409 Unspecified glaucoma: Secondary | ICD-10-CM | POA: Diagnosis not present

## 2016-11-09 DIAGNOSIS — Z9181 History of falling: Secondary | ICD-10-CM | POA: Diagnosis not present

## 2016-11-09 DIAGNOSIS — Z970 Presence of artificial eye: Secondary | ICD-10-CM | POA: Diagnosis not present

## 2016-11-09 DIAGNOSIS — H10401 Unspecified chronic conjunctivitis, right eye: Secondary | ICD-10-CM | POA: Diagnosis not present

## 2016-11-09 DIAGNOSIS — I10 Essential (primary) hypertension: Secondary | ICD-10-CM | POA: Diagnosis not present

## 2016-11-09 DIAGNOSIS — W19XXXD Unspecified fall, subsequent encounter: Secondary | ICD-10-CM | POA: Diagnosis not present

## 2016-11-09 DIAGNOSIS — H409 Unspecified glaucoma: Secondary | ICD-10-CM | POA: Diagnosis not present

## 2016-11-09 DIAGNOSIS — S32402D Unspecified fracture of left acetabulum, subsequent encounter for fracture with routine healing: Secondary | ICD-10-CM | POA: Diagnosis not present

## 2016-11-10 DIAGNOSIS — I10 Essential (primary) hypertension: Secondary | ICD-10-CM | POA: Diagnosis not present

## 2016-11-10 DIAGNOSIS — H409 Unspecified glaucoma: Secondary | ICD-10-CM | POA: Diagnosis not present

## 2016-11-10 DIAGNOSIS — S32402D Unspecified fracture of left acetabulum, subsequent encounter for fracture with routine healing: Secondary | ICD-10-CM | POA: Diagnosis not present

## 2016-11-10 DIAGNOSIS — Z970 Presence of artificial eye: Secondary | ICD-10-CM | POA: Diagnosis not present

## 2016-11-10 DIAGNOSIS — Z9181 History of falling: Secondary | ICD-10-CM | POA: Diagnosis not present

## 2016-11-10 DIAGNOSIS — W19XXXD Unspecified fall, subsequent encounter: Secondary | ICD-10-CM | POA: Diagnosis not present

## 2016-11-15 DIAGNOSIS — H409 Unspecified glaucoma: Secondary | ICD-10-CM | POA: Diagnosis not present

## 2016-11-15 DIAGNOSIS — S32402D Unspecified fracture of left acetabulum, subsequent encounter for fracture with routine healing: Secondary | ICD-10-CM | POA: Diagnosis not present

## 2016-11-15 DIAGNOSIS — Z970 Presence of artificial eye: Secondary | ICD-10-CM | POA: Diagnosis not present

## 2016-11-15 DIAGNOSIS — I10 Essential (primary) hypertension: Secondary | ICD-10-CM | POA: Diagnosis not present

## 2016-11-15 DIAGNOSIS — Z9181 History of falling: Secondary | ICD-10-CM | POA: Diagnosis not present

## 2016-11-15 DIAGNOSIS — W19XXXD Unspecified fall, subsequent encounter: Secondary | ICD-10-CM | POA: Diagnosis not present

## 2016-11-16 DIAGNOSIS — H409 Unspecified glaucoma: Secondary | ICD-10-CM | POA: Diagnosis not present

## 2016-11-16 DIAGNOSIS — Z9181 History of falling: Secondary | ICD-10-CM | POA: Diagnosis not present

## 2016-11-16 DIAGNOSIS — I10 Essential (primary) hypertension: Secondary | ICD-10-CM | POA: Diagnosis not present

## 2016-11-16 DIAGNOSIS — Z970 Presence of artificial eye: Secondary | ICD-10-CM | POA: Diagnosis not present

## 2016-11-16 DIAGNOSIS — S32402D Unspecified fracture of left acetabulum, subsequent encounter for fracture with routine healing: Secondary | ICD-10-CM | POA: Diagnosis not present

## 2016-11-16 DIAGNOSIS — W19XXXD Unspecified fall, subsequent encounter: Secondary | ICD-10-CM | POA: Diagnosis not present

## 2016-11-17 DIAGNOSIS — Z970 Presence of artificial eye: Secondary | ICD-10-CM | POA: Diagnosis not present

## 2016-11-17 DIAGNOSIS — S32402D Unspecified fracture of left acetabulum, subsequent encounter for fracture with routine healing: Secondary | ICD-10-CM | POA: Diagnosis not present

## 2016-11-17 DIAGNOSIS — I10 Essential (primary) hypertension: Secondary | ICD-10-CM | POA: Diagnosis not present

## 2016-11-17 DIAGNOSIS — W19XXXD Unspecified fall, subsequent encounter: Secondary | ICD-10-CM | POA: Diagnosis not present

## 2016-11-17 DIAGNOSIS — H409 Unspecified glaucoma: Secondary | ICD-10-CM | POA: Diagnosis not present

## 2016-11-17 DIAGNOSIS — Z9181 History of falling: Secondary | ICD-10-CM | POA: Diagnosis not present

## 2016-11-21 DIAGNOSIS — W19XXXD Unspecified fall, subsequent encounter: Secondary | ICD-10-CM | POA: Diagnosis not present

## 2016-11-21 DIAGNOSIS — Z9181 History of falling: Secondary | ICD-10-CM | POA: Diagnosis not present

## 2016-11-21 DIAGNOSIS — H409 Unspecified glaucoma: Secondary | ICD-10-CM | POA: Diagnosis not present

## 2016-11-21 DIAGNOSIS — S32402D Unspecified fracture of left acetabulum, subsequent encounter for fracture with routine healing: Secondary | ICD-10-CM | POA: Diagnosis not present

## 2016-11-21 DIAGNOSIS — I10 Essential (primary) hypertension: Secondary | ICD-10-CM | POA: Diagnosis not present

## 2016-11-21 DIAGNOSIS — Z970 Presence of artificial eye: Secondary | ICD-10-CM | POA: Diagnosis not present

## 2016-11-22 DIAGNOSIS — Z9181 History of falling: Secondary | ICD-10-CM | POA: Diagnosis not present

## 2016-11-22 DIAGNOSIS — H409 Unspecified glaucoma: Secondary | ICD-10-CM | POA: Diagnosis not present

## 2016-11-22 DIAGNOSIS — S32402D Unspecified fracture of left acetabulum, subsequent encounter for fracture with routine healing: Secondary | ICD-10-CM | POA: Diagnosis not present

## 2016-11-22 DIAGNOSIS — Z970 Presence of artificial eye: Secondary | ICD-10-CM | POA: Diagnosis not present

## 2016-11-22 DIAGNOSIS — W19XXXD Unspecified fall, subsequent encounter: Secondary | ICD-10-CM | POA: Diagnosis not present

## 2016-11-22 DIAGNOSIS — I10 Essential (primary) hypertension: Secondary | ICD-10-CM | POA: Diagnosis not present

## 2016-11-23 ENCOUNTER — Other Ambulatory Visit: Payer: Self-pay | Admitting: Family Medicine

## 2016-11-23 DIAGNOSIS — W19XXXD Unspecified fall, subsequent encounter: Secondary | ICD-10-CM | POA: Diagnosis not present

## 2016-11-23 DIAGNOSIS — I10 Essential (primary) hypertension: Secondary | ICD-10-CM | POA: Diagnosis not present

## 2016-11-23 DIAGNOSIS — H10401 Unspecified chronic conjunctivitis, right eye: Secondary | ICD-10-CM | POA: Diagnosis not present

## 2016-11-23 DIAGNOSIS — Z9181 History of falling: Secondary | ICD-10-CM | POA: Diagnosis not present

## 2016-11-23 DIAGNOSIS — H409 Unspecified glaucoma: Secondary | ICD-10-CM | POA: Diagnosis not present

## 2016-11-23 DIAGNOSIS — S32402D Unspecified fracture of left acetabulum, subsequent encounter for fracture with routine healing: Secondary | ICD-10-CM | POA: Diagnosis not present

## 2016-11-23 DIAGNOSIS — Z970 Presence of artificial eye: Secondary | ICD-10-CM | POA: Diagnosis not present

## 2016-11-23 MED ORDER — METOPROLOL TARTRATE 25 MG PO TABS: 25.0000 mg | ORAL_TABLET | Freq: Every day | ORAL | 3 refills | Status: DC

## 2016-11-23 NOTE — Telephone Encounter (Signed)
1year rf. 

## 2016-11-23 NOTE — Telephone Encounter (Signed)
Pt contacted office for refill request on the following medications:  metoprolol tartrate (LOPRESSOR) 25 MG tablet.  [facility] mail order.  CB#[phone]/MW

## 2016-11-23 NOTE — Telephone Encounter (Signed)
Done-aa 

## 2016-11-23 NOTE — Telephone Encounter (Signed)
Please review-aa 

## 2016-11-24 DIAGNOSIS — S32402D Unspecified fracture of left acetabulum, subsequent encounter for fracture with routine healing: Secondary | ICD-10-CM | POA: Diagnosis not present

## 2016-11-24 DIAGNOSIS — Z9181 History of falling: Secondary | ICD-10-CM | POA: Diagnosis not present

## 2016-11-24 DIAGNOSIS — W19XXXD Unspecified fall, subsequent encounter: Secondary | ICD-10-CM | POA: Diagnosis not present

## 2016-11-24 DIAGNOSIS — H409 Unspecified glaucoma: Secondary | ICD-10-CM | POA: Diagnosis not present

## 2016-11-24 DIAGNOSIS — I10 Essential (primary) hypertension: Secondary | ICD-10-CM | POA: Diagnosis not present

## 2016-11-24 DIAGNOSIS — Z970 Presence of artificial eye: Secondary | ICD-10-CM | POA: Diagnosis not present

## 2016-11-29 DIAGNOSIS — H10401 Unspecified chronic conjunctivitis, right eye: Secondary | ICD-10-CM | POA: Diagnosis not present

## 2016-12-19 DIAGNOSIS — H10401 Unspecified chronic conjunctivitis, right eye: Secondary | ICD-10-CM | POA: Diagnosis not present

## 2016-12-23 ENCOUNTER — Telehealth: Payer: Self-pay | Admitting: Family Medicine

## 2017-01-02 ENCOUNTER — Telehealth: Payer: Self-pay | Admitting: Family Medicine

## 2017-01-02 MED ORDER — DOXYCYCLINE HYCLATE 100 MG PO TABS: 100.0000 mg | ORAL_TABLET | Freq: Two times a day (BID) | ORAL | 0 refills | Status: DC

## 2017-01-02 NOTE — Telephone Encounter (Signed)
Please review. [name], CMA  

## 2017-01-02 NOTE — Telephone Encounter (Signed)
Pt's daughter in law called saying Dr. Rosanna Randy told her was to call in an antibiotic for Mrs. [id number].to [place] [facility] on [employer] . Also they want a standing order at [facility] to give her tylenol as needed. Standing order.  Thanks, [employer]

## 2017-01-02 NOTE — Telephone Encounter (Signed)
Doxycycline 100mg BID for 5 days.

## 2017-01-02 NOTE — Telephone Encounter (Signed)
Medication sent in; daughter-in-law advised. Standing order for Tylenol faxed to [facility]. Renaldo Fiddler, CMA

## 2017-01-24 DIAGNOSIS — H10401 Unspecified chronic conjunctivitis, right eye: Secondary | ICD-10-CM | POA: Diagnosis not present

## 2017-02-07 ENCOUNTER — Other Ambulatory Visit: Payer: Self-pay | Admitting: Family Medicine

## 2017-03-07 DIAGNOSIS — H10401 Unspecified chronic conjunctivitis, right eye: Secondary | ICD-10-CM | POA: Diagnosis not present

## 2017-05-09 DIAGNOSIS — H10401 Unspecified chronic conjunctivitis, right eye: Secondary | ICD-10-CM | POA: Diagnosis not present

## 2017-08-07 DIAGNOSIS — H10401 Unspecified chronic conjunctivitis, right eye: Secondary | ICD-10-CM | POA: Diagnosis not present

## 2017-10-02 ENCOUNTER — Other Ambulatory Visit: Payer: Self-pay | Admitting: Family Medicine

## 2017-10-09 ENCOUNTER — Other Ambulatory Visit: Payer: Self-pay | Admitting: Family Medicine

## 2017-10-20 IMAGING — CR DG RIBS W/ CHEST 3+V*R*
1 series · 4 of 4 positions shown · non-contrast
Comparison: Chest x-ray 12/14/2015.

CLINICAL DATA: [AGE] female with history of fall with
right-sided rib pain.

EXAM:
RIGHT RIBS AND CHEST - 3+ VIEW

[Series 1: dg ribs unilateral w/chest right · 0.14mm/px · 4 of 4 slices shown]
[im 1/4]
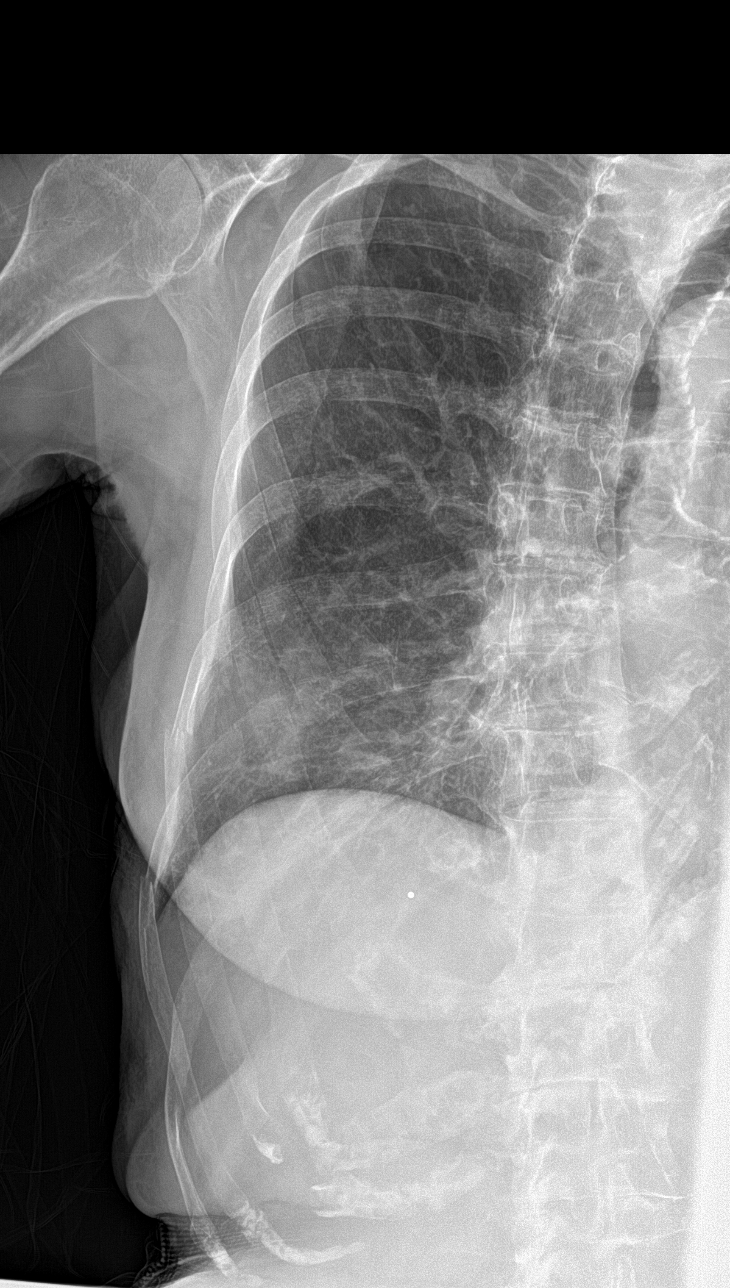
[im 2/4]
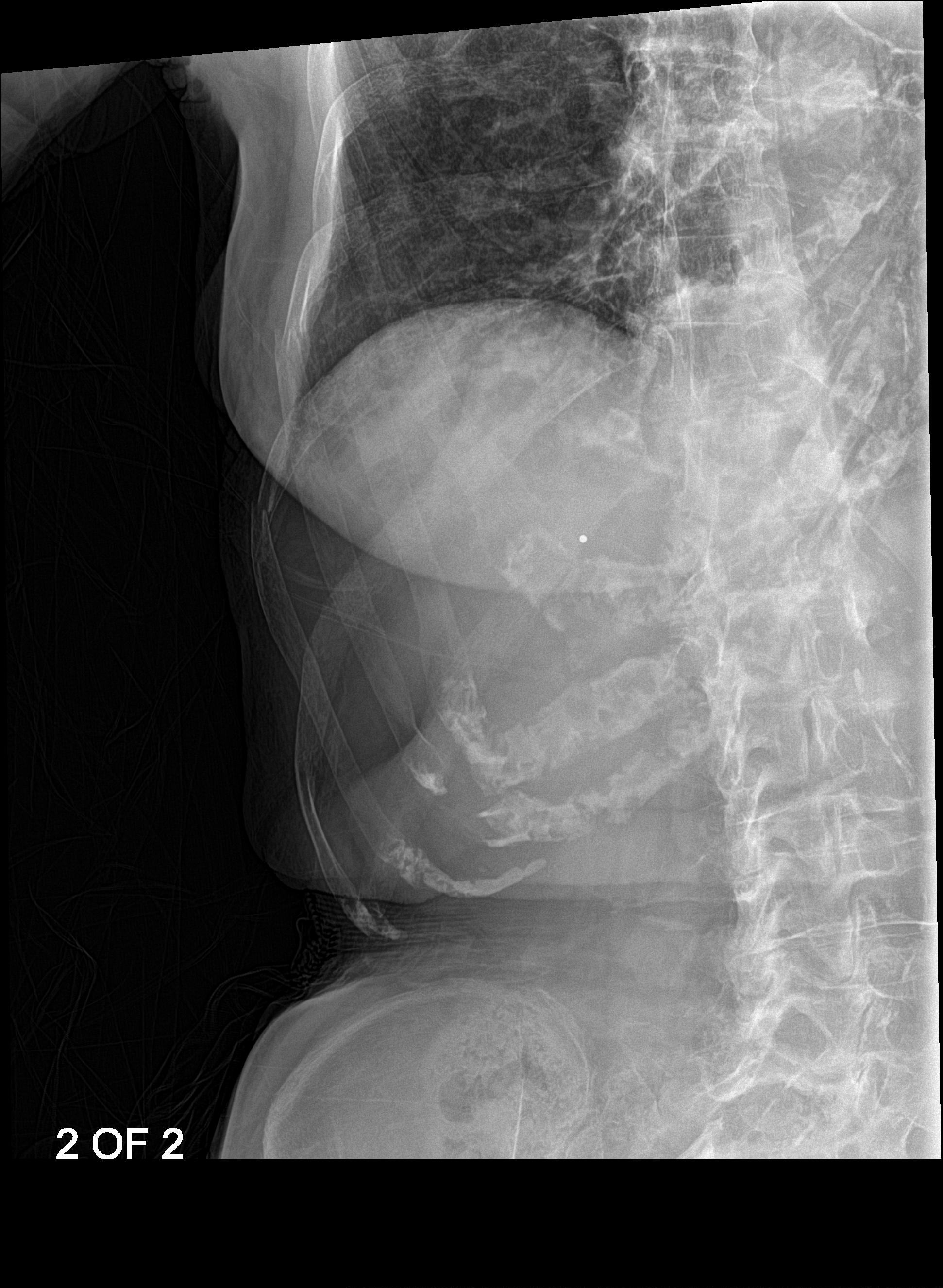
[im 3/4]
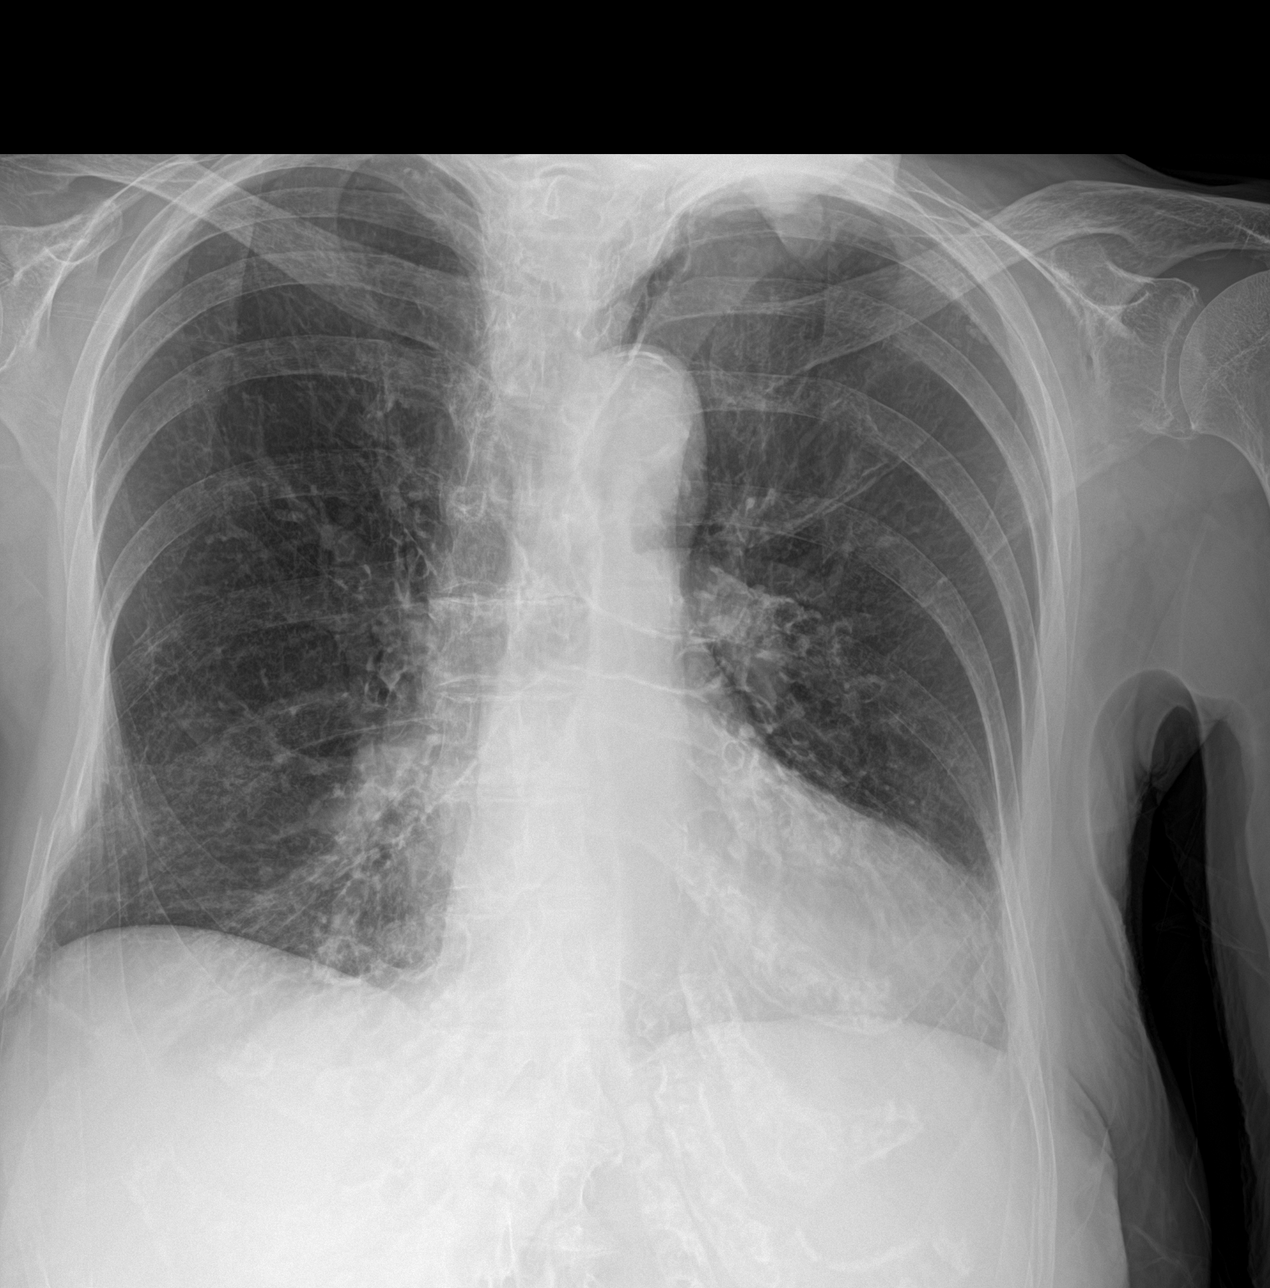
[im 4/4]
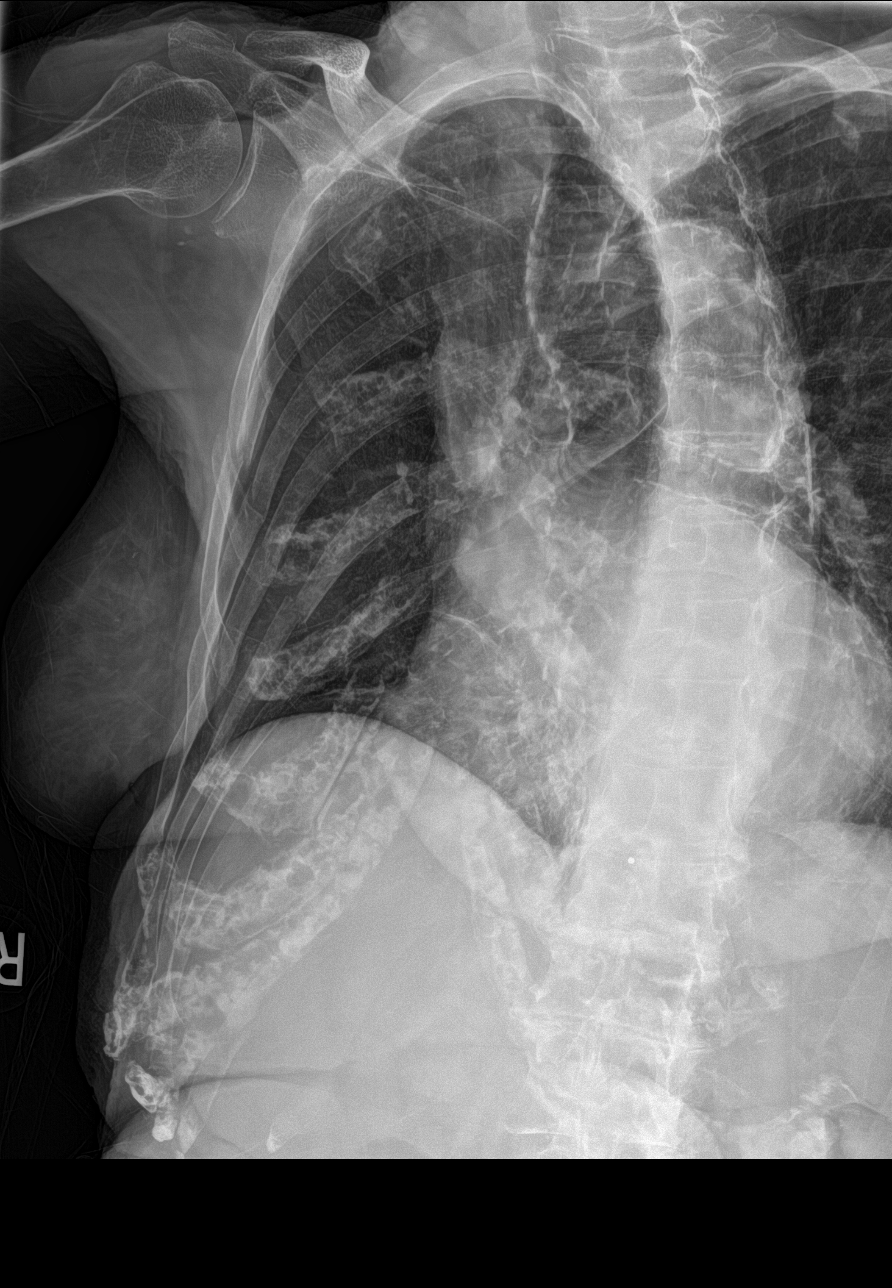

[4 of 4 positions shown; findings below may reference images not displayed]

FINDINGS: Lung volumes are normal. No consolidative airspace disease. No
pleural effusions. No evidence of pulmonary edema. No consolidate
extraction heart size is normal. The patient is rotated to the left
on today's exam, resulting in distortion of the mediastinal contours
and reduced diagnostic sensitivity and specificity for mediastinal
pathology. Atherosclerosis in the thoracic aorta.

Multiple views of the right ribs demonstrate acute fractures of the
right seventh, eighth and ninth ribs. The right seventh and ninth
rib fractures are nondisplaced, while there is mild lateral
displacement of the right eighth rib fracture.
IMPRESSION: 1. Acute fractures of the lateral right seventh, eighth and ninth
ribs. No associated pneumothorax.

## 2017-10-20 IMAGING — CR DG LUMBAR SPINE 2-3V
1 series · 3 of 3 positions shown · non-contrast
Comparison: Lumbar spine CT on 09/25/2014

CLINICAL DATA: Fall with back pain.  Initial encounter.

EXAM:
LUMBAR SPINE - 2-3 VIEW

[Series 1: dg lumbar spine 2-3 views · 0.14mm/px · 3 of 3 slices shown]
[im 1/3]
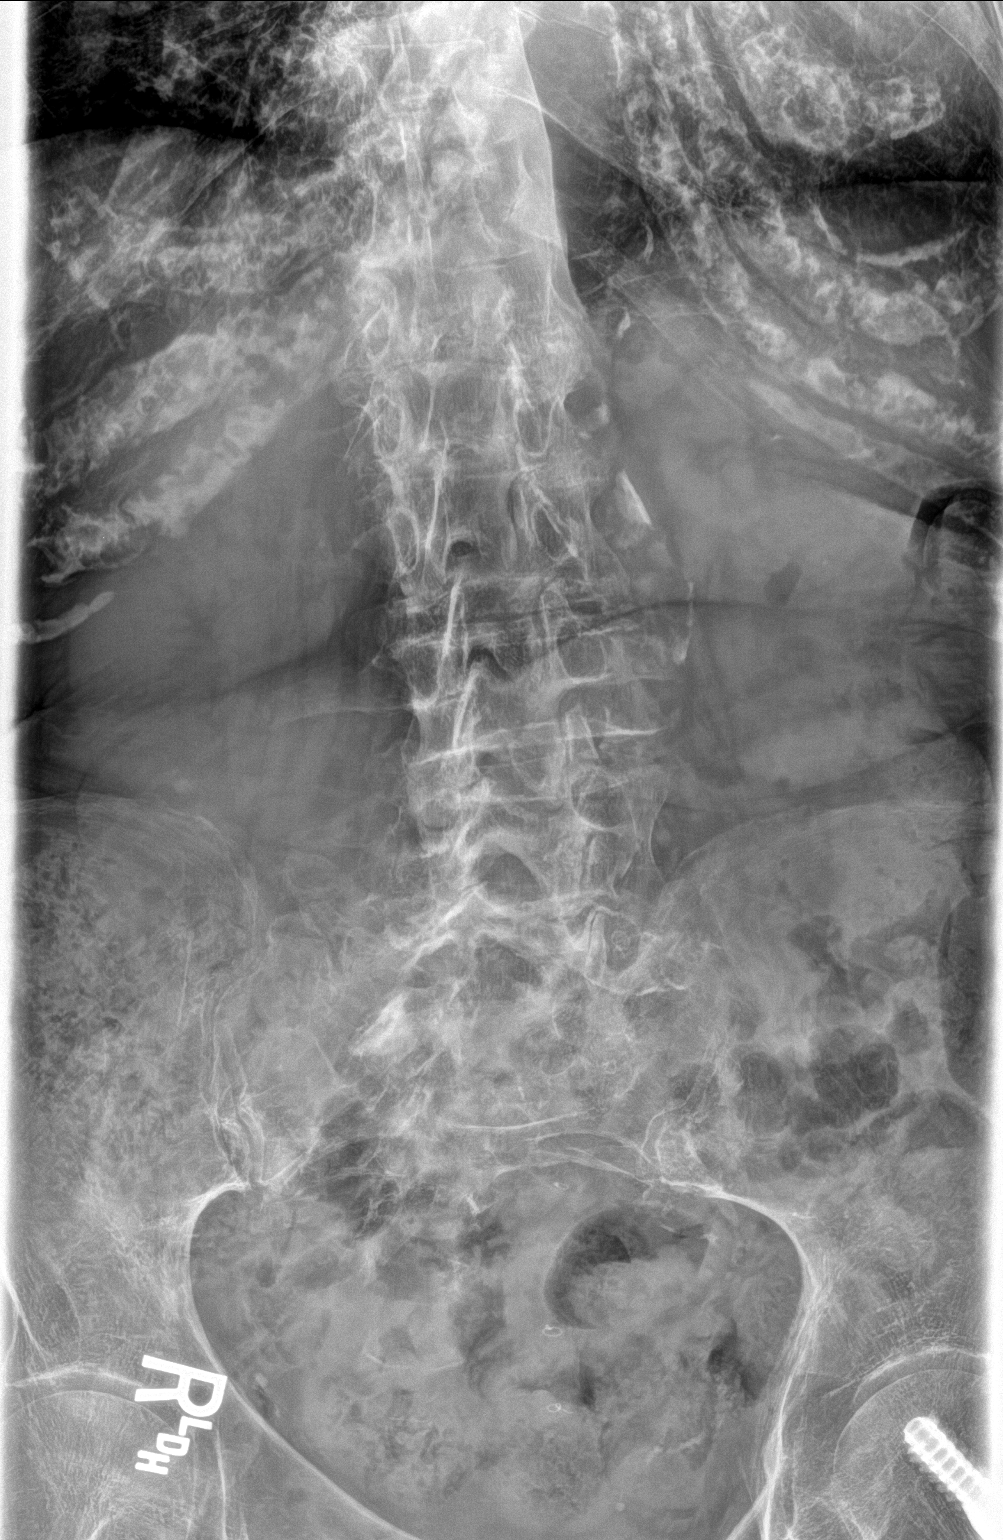
[im 2/3]
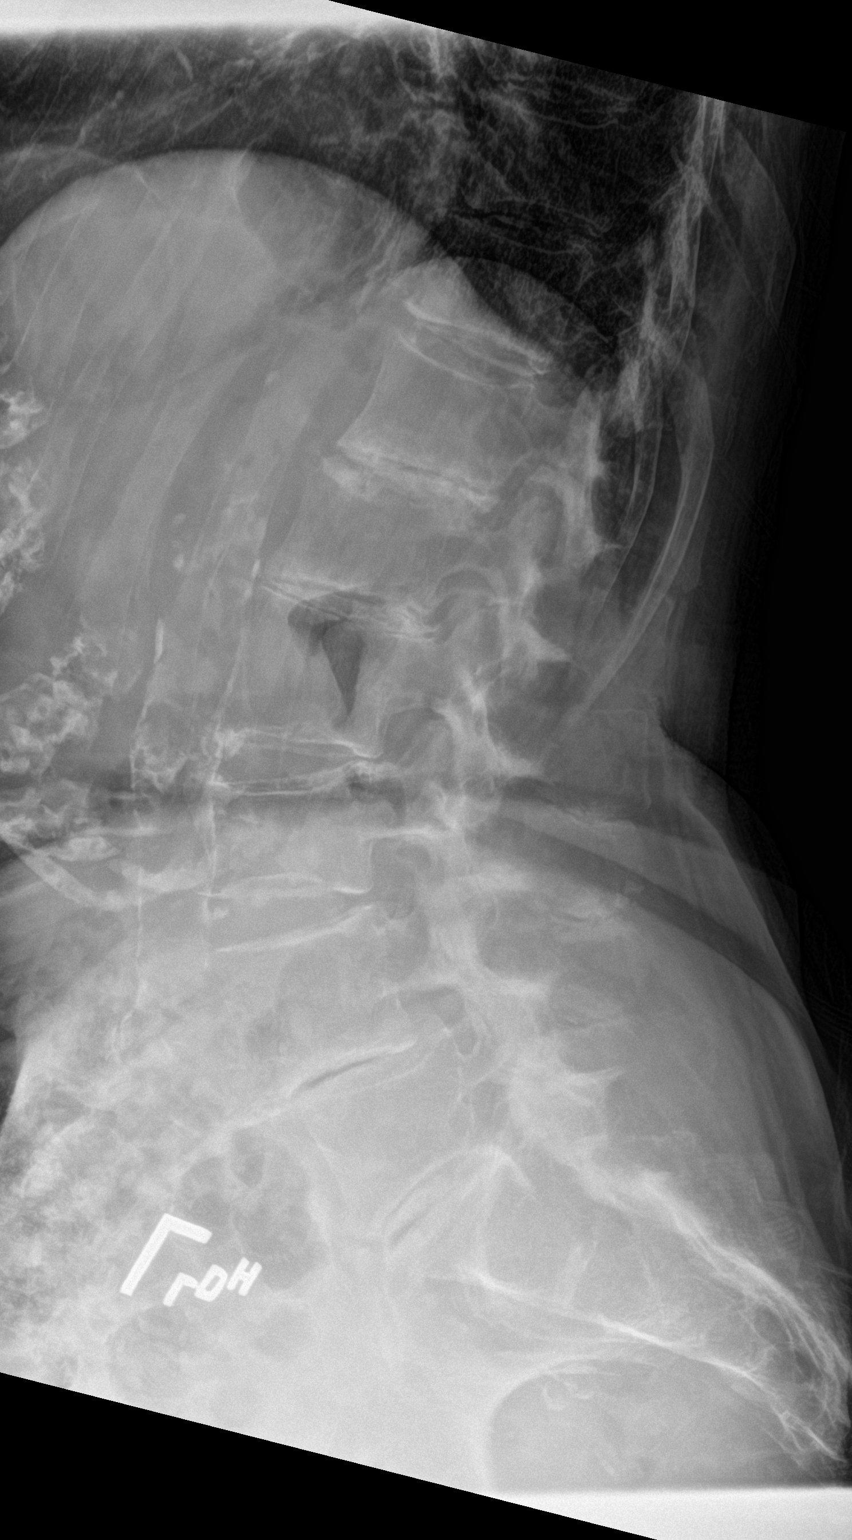
[im 3/3]
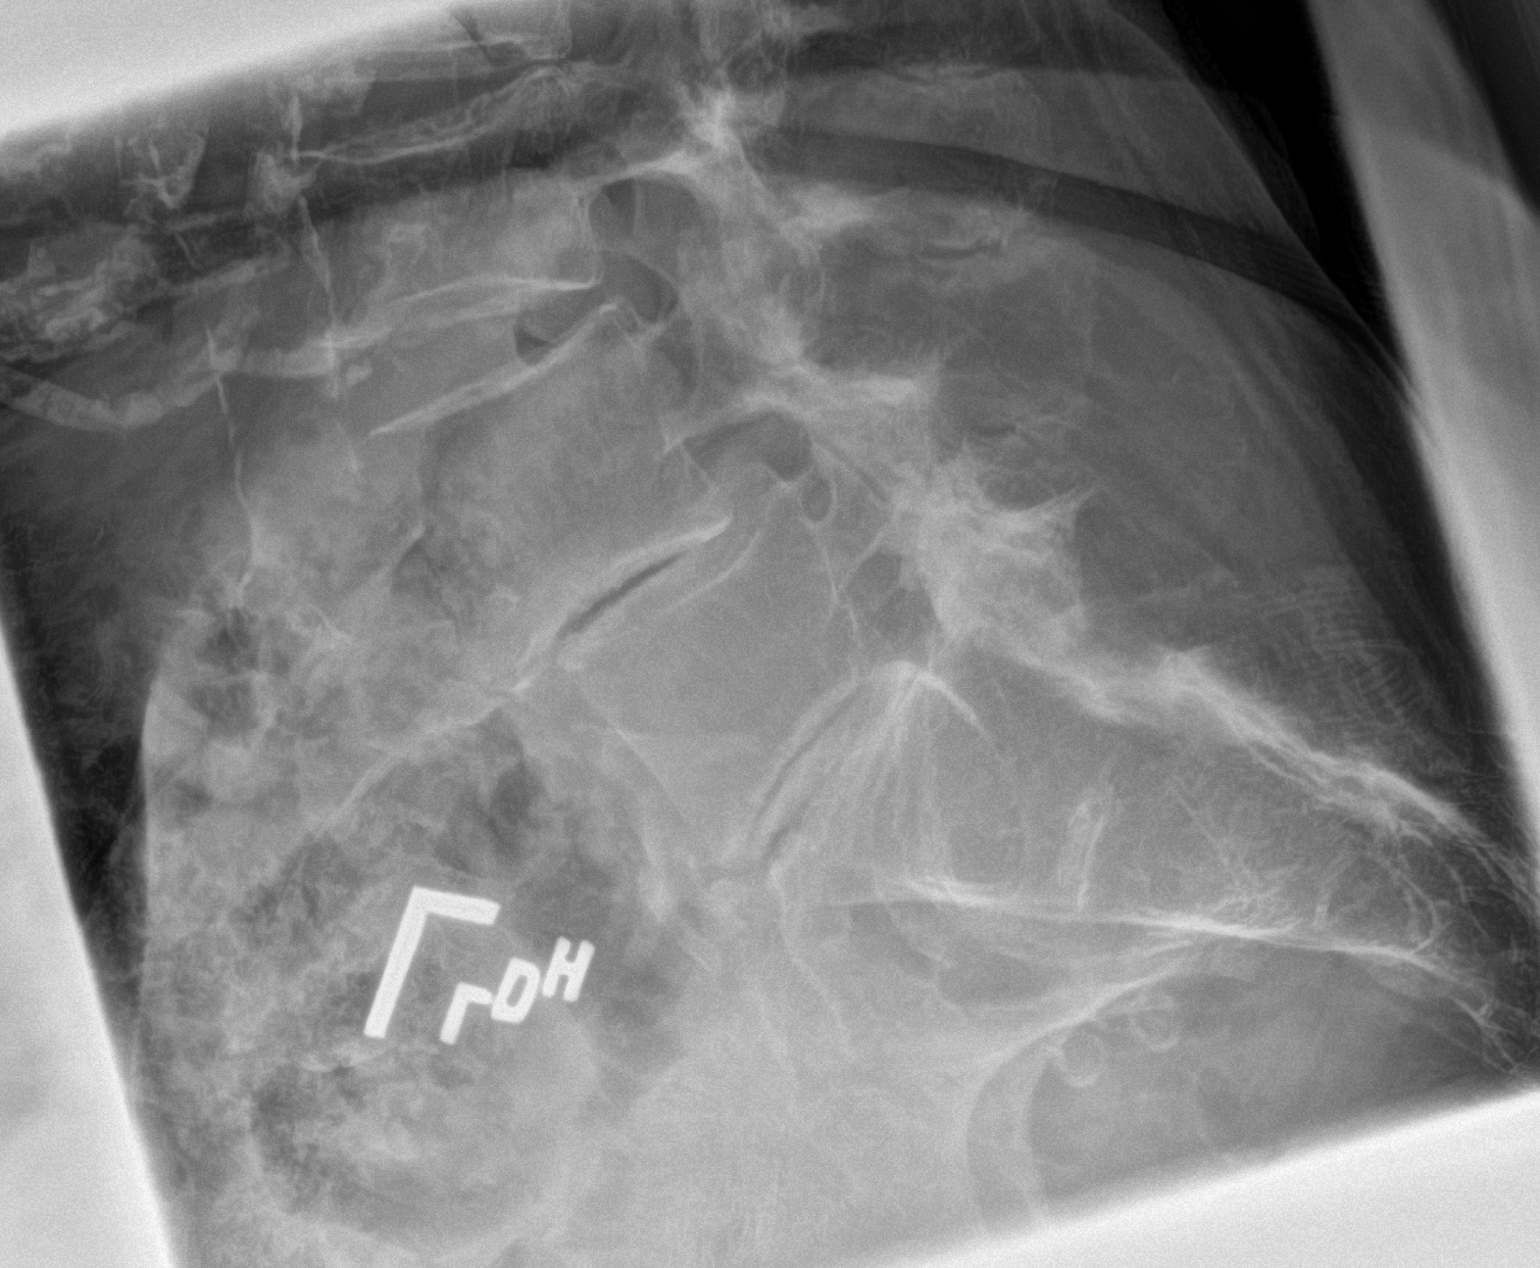

[3 of 3 positions shown; findings below may reference images not displayed]

FINDINGS: No acute lumbar spine fracture is identified by radiography. There
is stable mild compression of the L3 vertebral body. Stable roughly
7 mm anterolisthesis of L4 on L5 with associated degenerative disc
disease at L4-5 with vacuum disc. Moderate degenerative disc disease
is present at L5-S1 with vacuum disc. No bony lesions identified.
Stable calcified abdominal aorta.
IMPRESSION: No acute lumbar spine fractures identified.

## 2017-10-25 ENCOUNTER — Other Ambulatory Visit: Payer: Self-pay | Admitting: Family Medicine

## 2017-10-26 ENCOUNTER — Other Ambulatory Visit: Payer: Self-pay | Admitting: Family Medicine

## 2017-10-27 ENCOUNTER — Telehealth: Payer: Self-pay | Admitting: Family Medicine

## 2017-10-27 ENCOUNTER — Other Ambulatory Visit: Payer: Self-pay | Admitting: Family Medicine

## 2017-10-27 DIAGNOSIS — G4700 Insomnia, unspecified: Secondary | ICD-10-CM

## 2017-10-27 MED ORDER — MELATONIN 3 MG PO TABS: 3.0000 mg | ORAL_TABLET | Freq: Every day | ORAL | 0 refills | Status: DC

## 2017-10-27 NOTE — Telephone Encounter (Signed)
Received call from Mechele Claude (daughter-in-law) reporting patient has been waking at 2:00 am with difficulty going back to sleep. She has had the tendency to get up when awake and has had multiple fractures in the past. Presently gets Melatonin 1 mg 2 at bedtime. Will fax an order to allow 3 mg at bedtime. (Fax # [phone]).

## 2017-10-31 ENCOUNTER — Telehealth: Payer: Self-pay | Admitting: Family Medicine

## 2017-11-30 DIAGNOSIS — H10401 Unspecified chronic conjunctivitis, right eye: Secondary | ICD-10-CM | POA: Diagnosis not present

## 2017-12-08 ENCOUNTER — Other Ambulatory Visit: Payer: Self-pay | Admitting: Family Medicine

## 2017-12-25 ENCOUNTER — Other Ambulatory Visit: Payer: Self-pay | Admitting: Family Medicine

## 2018-01-17 ENCOUNTER — Telehealth: Payer: Self-pay | Admitting: Emergency Medicine

## 2018-01-17 MED ORDER — DOXYCYCLINE HYCLATE 100 MG PO TABS: 100.0000 mg | ORAL_TABLET | Freq: Two times a day (BID) | ORAL | 0 refills | Status: DC

## 2018-01-17 NOTE — Telephone Encounter (Signed)
Fax came over from [facility] stating that pt complaining of cough and sounds really congested. Form has been sent back to Dr. Rosanna Randy to sign. He wants to sent in Doxy 100mg  BID for 5 days.

## 2018-02-24 ENCOUNTER — Telehealth: Payer: Self-pay | Admitting: Physician Assistant

## 2018-02-24 ENCOUNTER — Telehealth: Payer: Self-pay | Admitting: Family Medicine

## 2018-02-24 DIAGNOSIS — R05 Cough: Secondary | ICD-10-CM

## 2018-02-24 DIAGNOSIS — R059 Cough, unspecified: Secondary | ICD-10-CM

## 2018-02-24 MED ORDER — GUAIFENESIN-DM 100-10 MG/5ML PO SYRP: 5.0000 mL | ORAL_SOLUTION | ORAL | 0 refills | Status: DC | PRN

## 2018-02-24 NOTE — Telephone Encounter (Signed)
Daughter in law and son who is on [place] and has POA called today regarding this patient. She is at [facility] in [place]. 01/17/2018 it appears doxycycline 100 mg BID x 5 days was sent in for cough. She has had some residual cough since then which has been keeping her up at night and the son would like an order for Robitussin DM to be sent to the long term care center because otherwise they have to come and personally administer the medication every four hours. She has been taking this and tolerating it well with good relief of her symptoms. The patient is not having any productive cough, fever, confusion, nausea/vomiting, or falls. She is unable to get around very well and come to the clinic. I have faxed order to [facility] for Robitussin DM 5 mL Q4H PRN. Have counseled son that if she displays fever, increased confusion, falling, N/V she should be further evaluated.

## 2018-02-25 ENCOUNTER — Non-Acute Institutional Stay: Payer: Medicare Other | Admitting: Family Medicine

## 2018-02-25 DIAGNOSIS — I10 Essential (primary) hypertension: Secondary | ICD-10-CM

## 2018-02-25 DIAGNOSIS — Z8781 Personal history of (healed) traumatic fracture: Secondary | ICD-10-CM | POA: Diagnosis not present

## 2018-02-25 DIAGNOSIS — E039 Hypothyroidism, unspecified: Secondary | ICD-10-CM | POA: Diagnosis not present

## 2018-02-25 DIAGNOSIS — K219 Gastro-esophageal reflux disease without esophagitis: Secondary | ICD-10-CM

## 2018-02-25 DIAGNOSIS — M8000XD Age-related osteoporosis with current pathological fracture, unspecified site, subsequent encounter for fracture with routine healing: Secondary | ICD-10-CM | POA: Diagnosis not present

## 2018-02-25 DIAGNOSIS — H16012 Central corneal ulcer, left eye: Secondary | ICD-10-CM | POA: Diagnosis not present

## 2018-02-25 NOTE — Progress Notes (Signed)
Pt with recent bronchitis,feeling better after 2 days of antibiotics.She has mild cough but feels better. Overall she has severe osteoporosis and can only transfer to chair from bed and wheelchair. She does complain of worsening LE edema which is not present when she wakes up in morning. No SOB,PND,Orthopnea,CP. Exam--pt appears younger than her age of 55. She is A and O. Left eyelid chronically will not close completely.Proshetic left eye. COR-RRR Lungs-clear Abd--soft Ext--1+ bilat edema   A/P Bronchitis--improving. Osteoporosis Hypothyroid HTN OA GERD  Will need lab work this year.  I have done the exam and reviewed the chart and it is accurate to the best of my knowledge. Development worker, community has been used and  any errors in dictation or transcription are unintentional. Miguel Aschoff M.D. [facility]

## 2018-02-26 NOTE — Telephone Encounter (Signed)
Saw pt at [facility] 02/25/18. Shee looks good--normal exam

## 2018-03-29 ENCOUNTER — Ambulatory Visit (INDEPENDENT_AMBULATORY_CARE_PROVIDER_SITE_OTHER): Payer: Medicare Other | Admitting: Podiatry

## 2018-03-29 ENCOUNTER — Encounter: Payer: Self-pay | Admitting: Podiatry

## 2018-03-29 DIAGNOSIS — M79674 Pain in right toe(s): Secondary | ICD-10-CM | POA: Diagnosis not present

## 2018-03-29 DIAGNOSIS — R609 Edema, unspecified: Secondary | ICD-10-CM

## 2018-03-29 DIAGNOSIS — B351 Tinea unguium: Secondary | ICD-10-CM

## 2018-03-29 DIAGNOSIS — M79675 Pain in left toe(s): Secondary | ICD-10-CM | POA: Diagnosis not present

## 2018-03-29 NOTE — Progress Notes (Signed)
Complaint:  Visit Type: Patient returns to my office for continued preventative foot care services. Complaint: Patient states" my nails have grown long and thick and become painful to walk and wear shoes"  The patient presents for preventative foot care services. No changes to ROS.  Patient says her nails have not been done for over 1 year.  Podiatric Exam: Vascular: dorsalis pedis and posterior tibial pulses are not  palpable bilateral due to severe foot swelling.l. Capillary return is immediate. Temperature gradient is WNL. Skin turgor WNL  Sensorium: Normal Semmes Weinstein monofilament test. Normal tactile sensation bilaterally. Nail Exam: Pt has thick disfigured discolored nails with subungual debris noted bilateral entire nail hallux through fifth toenails Ulcer Exam: There is no evidence of ulcer or pre-ulcerative changes or infection. Orthopedic Exam: Muscle tone and strength are WNL. No limitations in general ROM. No crepitus or effusions noted. Foot type and digits show no abnormalities. Bony prominences are unremarkable. Skin: No Porokeratosis. No infection or ulcers  Diagnosis:  Onychomycosis, , Pain in right toe, pain in left toes  Treatment & Plan Procedures and Treatment: Consent by patient was obtained for treatment procedures.   Debridement of mycotic and hypertrophic toenails, 1 through 5 bilateral and clearing of subungual debris. No ulceration, no infection noted.  Return Visit-Office Procedure: Patient instructed to return to the office for a follow up visit 3 months for continued evaluation and treatment.    Gardiner Barefoot DPM

## 2018-04-02 DIAGNOSIS — H10401 Unspecified chronic conjunctivitis, right eye: Secondary | ICD-10-CM | POA: Diagnosis not present

## 2018-04-09 ENCOUNTER — Telehealth: Payer: Self-pay | Admitting: Family Medicine

## 2018-04-09 DIAGNOSIS — R059 Cough, unspecified: Secondary | ICD-10-CM

## 2018-04-09 DIAGNOSIS — R05 Cough: Secondary | ICD-10-CM

## 2018-04-09 NOTE — Telephone Encounter (Signed)
Pt's daughter in law Remo Lipps is requesting written orders for pt to be able to take OTC Delsym cough syrup every 12 hours and OTC Claritin. Arville Go stated that she and pt's son are going out of town and the only way the [employer] can give that to pt is with a written order. Arville Go is requesting that it be faxed today to [facility] or to call her when it's ready and she would like to pick up the order today because they are going out of town tomorrow and they don't pt to go without the medication. Please advise. Thanks TNP

## 2018-04-10 DIAGNOSIS — M6281 Muscle weakness (generalized): Secondary | ICD-10-CM | POA: Diagnosis not present

## 2018-04-10 DIAGNOSIS — R296 Repeated falls: Secondary | ICD-10-CM | POA: Diagnosis not present

## 2018-04-10 DIAGNOSIS — R2681 Unsteadiness on feet: Secondary | ICD-10-CM | POA: Diagnosis not present

## 2018-04-10 DIAGNOSIS — M169 Osteoarthritis of hip, unspecified: Secondary | ICD-10-CM | POA: Diagnosis not present

## 2018-04-10 DIAGNOSIS — I10 Essential (primary) hypertension: Secondary | ICD-10-CM | POA: Diagnosis not present

## 2018-04-10 NOTE — Telephone Encounter (Signed)
OK 

## 2018-04-10 NOTE — Telephone Encounter (Signed)
Pt's son is wanting to know if this can be done today.  States until the order is sent the family in having to go twice a day to give her the medication even though they are OTC.

## 2018-04-11 DIAGNOSIS — R2681 Unsteadiness on feet: Secondary | ICD-10-CM | POA: Diagnosis not present

## 2018-04-11 DIAGNOSIS — R296 Repeated falls: Secondary | ICD-10-CM | POA: Diagnosis not present

## 2018-04-11 DIAGNOSIS — I10 Essential (primary) hypertension: Secondary | ICD-10-CM | POA: Diagnosis not present

## 2018-04-11 DIAGNOSIS — M169 Osteoarthritis of hip, unspecified: Secondary | ICD-10-CM | POA: Diagnosis not present

## 2018-04-11 DIAGNOSIS — M6281 Muscle weakness (generalized): Secondary | ICD-10-CM | POA: Diagnosis not present

## 2018-04-11 MED ORDER — GUAIFENESIN-DM 100-10 MG/5ML PO SYRP: 5.0000 mL | ORAL_SOLUTION | ORAL | 0 refills | Status: AC | PRN

## 2018-04-11 MED ORDER — LORATADINE 10 MG PO TABS: 10.0000 mg | ORAL_TABLET | Freq: Every day | ORAL | 11 refills | Status: DC

## 2018-04-12 ENCOUNTER — Telehealth: Payer: Self-pay | Admitting: Family Medicine

## 2018-04-12 NOTE — Telephone Encounter (Signed)
Pt's son called wanting to know if the order for Delsem 12 hr and Claritin had been done.  They are still having to go over and give this to her themselves.  They use [facility]  She is at [facility] and they have not received any order as of  today  Son's call back is [phone]  Thanks Con Memos

## 2018-04-13 DIAGNOSIS — M169 Osteoarthritis of hip, unspecified: Secondary | ICD-10-CM | POA: Diagnosis not present

## 2018-04-13 DIAGNOSIS — R296 Repeated falls: Secondary | ICD-10-CM | POA: Diagnosis not present

## 2018-04-13 DIAGNOSIS — M6281 Muscle weakness (generalized): Secondary | ICD-10-CM | POA: Diagnosis not present

## 2018-04-13 DIAGNOSIS — R2681 Unsteadiness on feet: Secondary | ICD-10-CM | POA: Diagnosis not present

## 2018-04-13 DIAGNOSIS — I10 Essential (primary) hypertension: Secondary | ICD-10-CM | POA: Diagnosis not present

## 2018-04-13 NOTE — Telephone Encounter (Signed)
Please review. Thanks!  

## 2018-04-13 NOTE — Telephone Encounter (Signed)
Ok to call this in --I think he is saying they need an order at [facility] which I thought we did. thx

## 2018-04-16 ENCOUNTER — Ambulatory Visit (INDEPENDENT_AMBULATORY_CARE_PROVIDER_SITE_OTHER): Payer: Medicare Other | Admitting: Family Medicine

## 2018-04-16 ENCOUNTER — Encounter: Payer: Self-pay | Admitting: Family Medicine

## 2018-04-16 VITALS — BP 126/62 | HR 56 | Temp 97.9°F

## 2018-04-16 DIAGNOSIS — R609 Edema, unspecified: Secondary | ICD-10-CM

## 2018-04-16 DIAGNOSIS — J302 Other seasonal allergic rhinitis: Secondary | ICD-10-CM

## 2018-04-16 DIAGNOSIS — R05 Cough: Secondary | ICD-10-CM | POA: Diagnosis not present

## 2018-04-16 DIAGNOSIS — R053 Chronic cough: Secondary | ICD-10-CM

## 2018-04-16 MED ORDER — CEFDINIR 300 MG PO CAPS: 300.0000 mg | ORAL_CAPSULE | Freq: Two times a day (BID) | ORAL | 0 refills | Status: DC

## 2018-04-16 MED ORDER — DEXTROMETHORPHAN POLISTIREX ER 30 MG/5ML PO SUER: 30.0000 mg | ORAL | 0 refills | Status: AC | PRN

## 2018-04-16 MED ORDER — LORATADINE 10 MG PO TABS: 10.0000 mg | ORAL_TABLET | Freq: Every day | ORAL | 11 refills | Status: AC

## 2018-04-16 MED ORDER — PREDNISONE 5 MG PO TABS
ORAL_TABLET | ORAL | 0 refills | Status: DC
Start: 2018-04-16 — End: 2019-08-27

## 2018-04-16 NOTE — Progress Notes (Signed)
Patient: [name] Female    DOB: 07-29-1914   84 y.o.   MRN: [id number] Visit Date: 04/16/2018   Today's Provider: Vernie Murders, PA   Chief Complaint  Patient presents with  . Cough  . Congestion   Subjective:    URI   This is a new problem. Episode onset: 9 weeks ago. The problem has been gradually worsening. There has been no fever. Associated symptoms include congestion and coughing. Treatments tried: Delsym and Claritin 24 hours. The treatment provided mild relief.   Past Medical History:  Diagnosis Date  . Eye infection   . Falls   . Pelvic fracture [facility])    Patient Active Problem List   Diagnosis Date Noted  . Age-related osteoporosis with current pathological fracture of left femur with routine healing 08/21/2016  . Pelvic fracture, closed, initial encounter 07/29/2016  . Edema leg 06/17/2016  . Acid reflux 06/17/2016  . Bleeding gastrointestinal 06/17/2016  . BP (high blood pressure) 06/17/2016  . Adult hypothyroidism 06/17/2016  . Colitis, ischemic ([place]) 06/17/2016  . Osteoarthrosis 06/17/2016  . OP (osteoporosis) 06/17/2016  . Gastroduodenal ulcer 06/17/2016  . Chronic follicular conjunctivitis 03/02/2016  . History of surgical procedure 03/02/2016  . 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 Personal history of healed traumatic fracture 12/21/2015  . Corneal perforation of left eye 12/18/2015  . Hyponatremia 12/16/2015  . Rib fracture 12/14/2015   Past Surgical History:  Procedure Laterality Date  . ABDOMINAL HYSTERECTOMY    . CESAREAN SECTION     Family History  Problem Relation Age of Onset  . Cancer Sister   . Cancer Sister    Allergies  Allergen Reactions  . Risedronate     Other reaction(s): Other (See Comments) GI upset  . Sulfa Antibiotics     Other reaction(s): Rash  . Teriparatide     Other reaction(s): Other (See Comments) GI upset    Current Outpatient Medications:  .  acetaminophen (TYLENOL) 325 MG tablet, Take by  mouth., Disp: , Rfl:  .  amLODipine (NORVASC) 10 MG tablet, TAKE 1 TABLET BY MOUTH ONCE DAILY FOR HEART/BP, Disp: 30 tablet, Rfl: 11 .  Cholecalciferol 4000 units CAPS, Take by mouth., Disp: , Rfl:  .  diphenhydramine-acetaminophen (TYLENOL PM) 25-500 MG TABS tablet, TAKE 2 CAPTABS BY MOUTH ONCE DAILY AT BEDTIME AS NEEDED FOR SLEEP, Disp: 100 tablet, Rfl: 11 .  guaiFENesin-dextromethorphan (ROBITUSSIN DM) 100-10 MG/5ML syrup, Take 5 mLs by mouth every 4 (four) hours as needed for cough., Disp: 118 mL, Rfl: 0 .  levobunolol (BETAGAN) 0.5 % ophthalmic solution, Place 1 drop into both eyes 2 (two) times daily. , Disp: , Rfl:  .  levothyroxine (SYNTHROID, LEVOTHROID) 50 MCG tablet, TAKE 1 TABLET BY MOUTH ONCE DAILY FOR HYPOTHYROIDISM.  ** GETS MEDS FROM MAILORDER **, Disp: 30 tablet, Rfl: 11 .  loratadine (CLARITIN) 10 MG tablet, Take 1 tablet (10 mg total) by mouth daily., Disp: 30 tablet, Rfl: 11 .  metoprolol succinate (TOPROL-XL) 50 MG 24 hr tablet, , Disp: , Rfl:  .  moxifloxacin (VIGAMOX) 0.5 % ophthalmic solution, INSTILL 1 DROP INTO BOTH EYES TWICE A DAY, Disp: 3 mL, Rfl: 11 .  pantoprazole (PROTONIX) 40 MG tablet, Take 1 tablet (40 mg total) by mouth 2 (two) times daily., Disp: 180 tablet, Rfl: 3 .  ramipril (ALTACE) 5 MG capsule, TAKE 1 CAPSULE BY MOUTH ONCE DAILY FOR BLOOD PRESSURE, Disp: 30 capsule, Rfl: 10 .  feeding supplement, ENSURE ENLIVE, (  ENSURE ENLIVE) LIQD, Take 237 mLs by mouth 3 (three) times daily with meals. (Patient not taking: Reported on 07/29/2016), Disp: 237 mL, Rfl: 12  Review of Systems  Constitutional: Negative.   HENT: Positive for congestion.   Respiratory: Positive for cough.   Cardiovascular: Negative.    Social History   Tobacco Use  . Smoking status: Never Smoker  . Smokeless tobacco: Never Used  Substance Use Topics  . Alcohol use: No   Objective:   BP 126/62 (BP Location: Right Arm, Patient Position: Sitting, Cuff Size: Normal)   Pulse (!) 56   Temp  97.9 F (36.6 C) (Oral)   LMP  (LMP Unknown)   SpO2 96%   Physical Exam  Constitutional: She is oriented to person, place, and time. She appears well-developed and well-nourished.  HENT:  Head: Normocephalic.  Right Ear: External ear normal.  Left Ear: External ear normal.  Nose: Nose normal.  Mouth/Throat: Oropharynx is clear and moist.  Eyes: Right eye exhibits no discharge.  Prosthetic left eye.  Neck: No JVD present.  Cardiovascular: Normal rate and regular rhythm.  Pulmonary/Chest: Effort normal. She has no wheezes. She has no rales.  Abdominal: Soft. Bowel sounds are normal.  Musculoskeletal: She exhibits edema.  1-2+ pitting edema of lower legs and feet.   Lymphadenopathy:    She has no cervical adenopathy.  Neurological: She is alert and oriented to person, place, and time.      Assessment & Plan:      1. Persistent cough for 3 weeks or longer [place] cough with a few rhonchi that clears with a cough over the past 8-9 weeks. Changed Robitussin to Delsym BID for cough suppression. Will treat with Omnicef for 10 days, use Claritin for rhinorrhea and give a 6 day 5 mg Prednisone taper. Increase fluid intake. May need Lasix if extremity elevation or support hose does not help the peripheral edema. Prescriptions given to family to pick up meds at the pharmacy and an extra copy to give to the nursing home staff for treatment orders. Recheck prn if no better after 7-10 days of treatment. - dextromethorphan (DELSYM COUGH CHILDRENS) 30 MG/5ML liquid; Take 5 mLs (30 mg total) by mouth as needed for cough. Twice a day for 1 week.  Dispense: 89 mL; Refill: 0 - predniSONE (DELTASONE) 5 MG tablet; 2 tablets breakfast , lunch and dinner- day 1  2 tablets breakfast & lunch then 1 at dinner - day 2 1 tablet breakfast, lunch, dinner & bedtime - day 3 1 tablet breakfast, lunch & dinner - day 4 1 tablet breakfast & dinner - day 5 1 tablet breakfast - day 6 All doses by mouth.  Dispense: 21  tablet; Refill: 0 - cefdinir (OMNICEF) 300 MG capsule; Take 1 capsule (300 mg total) by mouth 2 (two) times daily. Take all the antibiotics in 10 days.  Dispense: 20 capsule; Refill: 0  2. Seasonal allergic rhinitis, unspecified trigger Flare of sneezing, PND and rhinorrhea the past 2-3 weeks. May use Claritin 10 mg qd prn and prednisone taper. Increase fluid intake and recheck as needed. - dextromethorphan (DELSYM COUGH CHILDRENS) 30 MG/5ML liquid; Take 5 mLs (30 mg total) by mouth as needed for cough. Twice a day for 1 week.  Dispense: 89 mL; Refill: 0 - predniSONE (DELTASONE) 5 MG tablet; 2 tablets breakfast , lunch and dinner- day 1  2 tablets breakfast & lunch then 1 at dinner - day 2 1 tablet breakfast, lunch, dinner & bedtime -  day 3 1 tablet breakfast, lunch & dinner - day 4 1 tablet breakfast & dinner - day 5 1 tablet breakfast - day 6 All doses by mouth.  Dispense: 21 tablet; Refill: 0 - loratadine (CLARITIN) 10 MG tablet; Take 1 tablet (10 mg total) by mouth daily. For allergic rhinitis when it flares - may use as needed once a day.  Dispense: 30 tablet; Refill: 11  3. Peripheral edema No JVD, gallop, murmur or rales. Suspected venous insufficiency. With difficulty ambulating and poor balance, will hold use of diuretic unless needed. Elevation of legs helps by morning. May use support hose prn.       Vernie Murders, PA  [facility]

## 2018-04-17 DIAGNOSIS — R296 Repeated falls: Secondary | ICD-10-CM | POA: Diagnosis not present

## 2018-04-17 DIAGNOSIS — M169 Osteoarthritis of hip, unspecified: Secondary | ICD-10-CM | POA: Diagnosis not present

## 2018-04-17 DIAGNOSIS — M6281 Muscle weakness (generalized): Secondary | ICD-10-CM | POA: Diagnosis not present

## 2018-04-17 DIAGNOSIS — I10 Essential (primary) hypertension: Secondary | ICD-10-CM | POA: Diagnosis not present

## 2018-04-17 DIAGNOSIS — R2681 Unsteadiness on feet: Secondary | ICD-10-CM | POA: Diagnosis not present

## 2018-04-18 DIAGNOSIS — M169 Osteoarthritis of hip, unspecified: Secondary | ICD-10-CM | POA: Diagnosis not present

## 2018-04-18 DIAGNOSIS — I10 Essential (primary) hypertension: Secondary | ICD-10-CM | POA: Diagnosis not present

## 2018-04-18 DIAGNOSIS — R2681 Unsteadiness on feet: Secondary | ICD-10-CM | POA: Diagnosis not present

## 2018-04-18 DIAGNOSIS — M6281 Muscle weakness (generalized): Secondary | ICD-10-CM | POA: Diagnosis not present

## 2018-04-18 DIAGNOSIS — R296 Repeated falls: Secondary | ICD-10-CM | POA: Diagnosis not present

## 2018-04-19 DIAGNOSIS — R2681 Unsteadiness on feet: Secondary | ICD-10-CM | POA: Diagnosis not present

## 2018-04-19 DIAGNOSIS — M6281 Muscle weakness (generalized): Secondary | ICD-10-CM | POA: Diagnosis not present

## 2018-04-19 DIAGNOSIS — R296 Repeated falls: Secondary | ICD-10-CM | POA: Diagnosis not present

## 2018-04-19 DIAGNOSIS — I10 Essential (primary) hypertension: Secondary | ICD-10-CM | POA: Diagnosis not present

## 2018-04-19 DIAGNOSIS — M169 Osteoarthritis of hip, unspecified: Secondary | ICD-10-CM | POA: Diagnosis not present

## 2018-04-20 DIAGNOSIS — R296 Repeated falls: Secondary | ICD-10-CM | POA: Diagnosis not present

## 2018-04-20 DIAGNOSIS — M169 Osteoarthritis of hip, unspecified: Secondary | ICD-10-CM | POA: Diagnosis not present

## 2018-04-20 DIAGNOSIS — I10 Essential (primary) hypertension: Secondary | ICD-10-CM | POA: Diagnosis not present

## 2018-04-20 DIAGNOSIS — R2681 Unsteadiness on feet: Secondary | ICD-10-CM | POA: Diagnosis not present

## 2018-04-20 DIAGNOSIS — M6281 Muscle weakness (generalized): Secondary | ICD-10-CM | POA: Diagnosis not present

## 2018-04-24 DIAGNOSIS — M169 Osteoarthritis of hip, unspecified: Secondary | ICD-10-CM | POA: Diagnosis not present

## 2018-04-24 DIAGNOSIS — M6281 Muscle weakness (generalized): Secondary | ICD-10-CM | POA: Diagnosis not present

## 2018-04-24 DIAGNOSIS — I10 Essential (primary) hypertension: Secondary | ICD-10-CM | POA: Diagnosis not present

## 2018-04-24 DIAGNOSIS — R2681 Unsteadiness on feet: Secondary | ICD-10-CM | POA: Diagnosis not present

## 2018-04-24 DIAGNOSIS — R296 Repeated falls: Secondary | ICD-10-CM | POA: Diagnosis not present

## 2018-04-25 ENCOUNTER — Other Ambulatory Visit: Payer: Self-pay | Admitting: Family Medicine

## 2018-04-26 DIAGNOSIS — I10 Essential (primary) hypertension: Secondary | ICD-10-CM | POA: Diagnosis not present

## 2018-04-26 DIAGNOSIS — R2681 Unsteadiness on feet: Secondary | ICD-10-CM | POA: Diagnosis not present

## 2018-04-26 DIAGNOSIS — R296 Repeated falls: Secondary | ICD-10-CM | POA: Diagnosis not present

## 2018-04-26 DIAGNOSIS — M169 Osteoarthritis of hip, unspecified: Secondary | ICD-10-CM | POA: Diagnosis not present

## 2018-04-26 DIAGNOSIS — M6281 Muscle weakness (generalized): Secondary | ICD-10-CM | POA: Diagnosis not present

## 2018-04-30 DIAGNOSIS — M169 Osteoarthritis of hip, unspecified: Secondary | ICD-10-CM | POA: Diagnosis not present

## 2018-04-30 DIAGNOSIS — I10 Essential (primary) hypertension: Secondary | ICD-10-CM | POA: Diagnosis not present

## 2018-04-30 DIAGNOSIS — R2681 Unsteadiness on feet: Secondary | ICD-10-CM | POA: Diagnosis not present

## 2018-04-30 DIAGNOSIS — R296 Repeated falls: Secondary | ICD-10-CM | POA: Diagnosis not present

## 2018-04-30 DIAGNOSIS — M6281 Muscle weakness (generalized): Secondary | ICD-10-CM | POA: Diagnosis not present

## 2018-05-01 DIAGNOSIS — M6281 Muscle weakness (generalized): Secondary | ICD-10-CM | POA: Diagnosis not present

## 2018-05-01 DIAGNOSIS — R2681 Unsteadiness on feet: Secondary | ICD-10-CM | POA: Diagnosis not present

## 2018-05-01 DIAGNOSIS — R296 Repeated falls: Secondary | ICD-10-CM | POA: Diagnosis not present

## 2018-05-01 DIAGNOSIS — I10 Essential (primary) hypertension: Secondary | ICD-10-CM | POA: Diagnosis not present

## 2018-05-01 DIAGNOSIS — M169 Osteoarthritis of hip, unspecified: Secondary | ICD-10-CM | POA: Diagnosis not present

## 2018-05-02 DIAGNOSIS — R296 Repeated falls: Secondary | ICD-10-CM | POA: Diagnosis not present

## 2018-05-02 DIAGNOSIS — M6281 Muscle weakness (generalized): Secondary | ICD-10-CM | POA: Diagnosis not present

## 2018-05-02 DIAGNOSIS — I10 Essential (primary) hypertension: Secondary | ICD-10-CM | POA: Diagnosis not present

## 2018-05-02 DIAGNOSIS — M169 Osteoarthritis of hip, unspecified: Secondary | ICD-10-CM | POA: Diagnosis not present

## 2018-05-02 DIAGNOSIS — R2681 Unsteadiness on feet: Secondary | ICD-10-CM | POA: Diagnosis not present

## 2018-05-08 DIAGNOSIS — I10 Essential (primary) hypertension: Secondary | ICD-10-CM | POA: Diagnosis not present

## 2018-05-08 DIAGNOSIS — R296 Repeated falls: Secondary | ICD-10-CM | POA: Diagnosis not present

## 2018-05-08 DIAGNOSIS — R2681 Unsteadiness on feet: Secondary | ICD-10-CM | POA: Diagnosis not present

## 2018-05-08 DIAGNOSIS — M169 Osteoarthritis of hip, unspecified: Secondary | ICD-10-CM | POA: Diagnosis not present

## 2018-05-08 DIAGNOSIS — M6281 Muscle weakness (generalized): Secondary | ICD-10-CM | POA: Diagnosis not present

## 2018-06-05 IMAGING — CR DG CHEST 2V
2 series · 2 of 2 positions shown · non-contrast
Comparison: None.

CLINICAL DATA: Fall.

EXAM:
CHEST  2 VIEW

[chest lat]
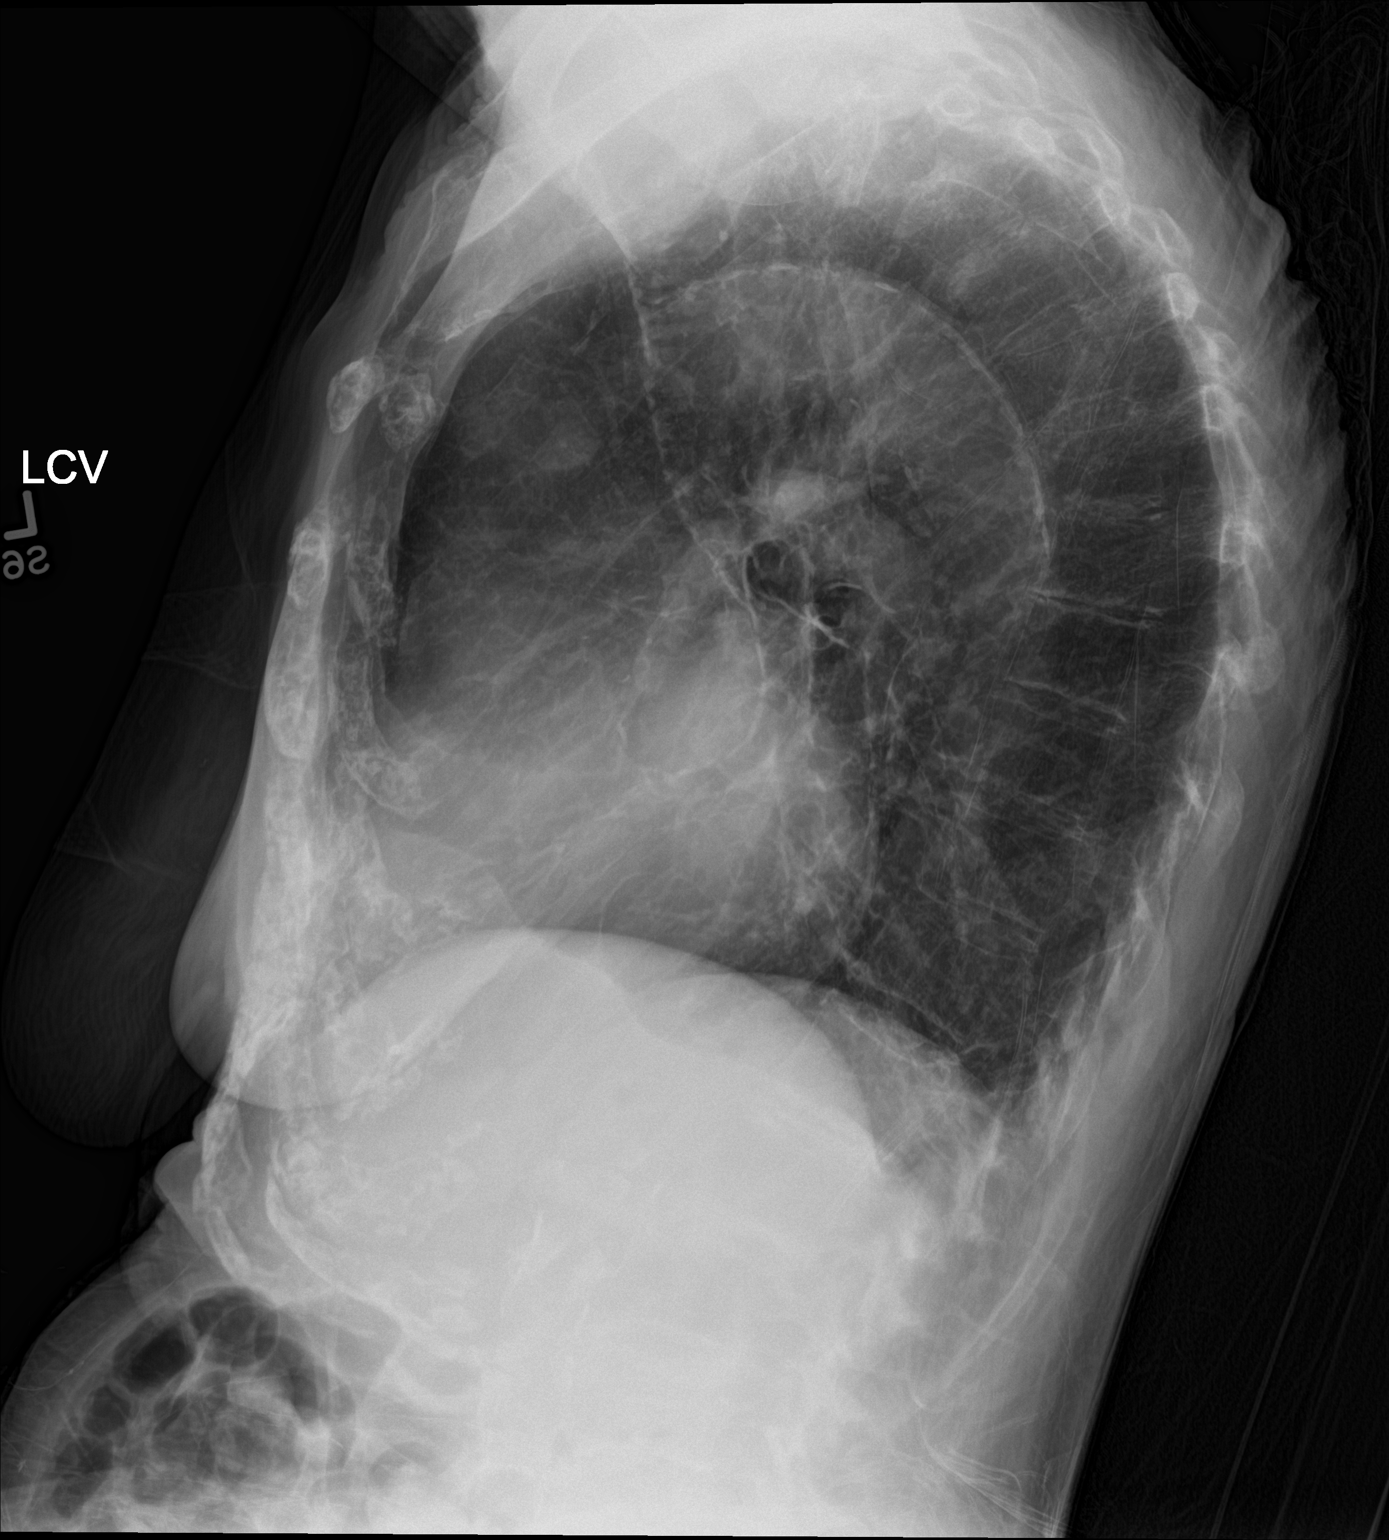

[chest ap]
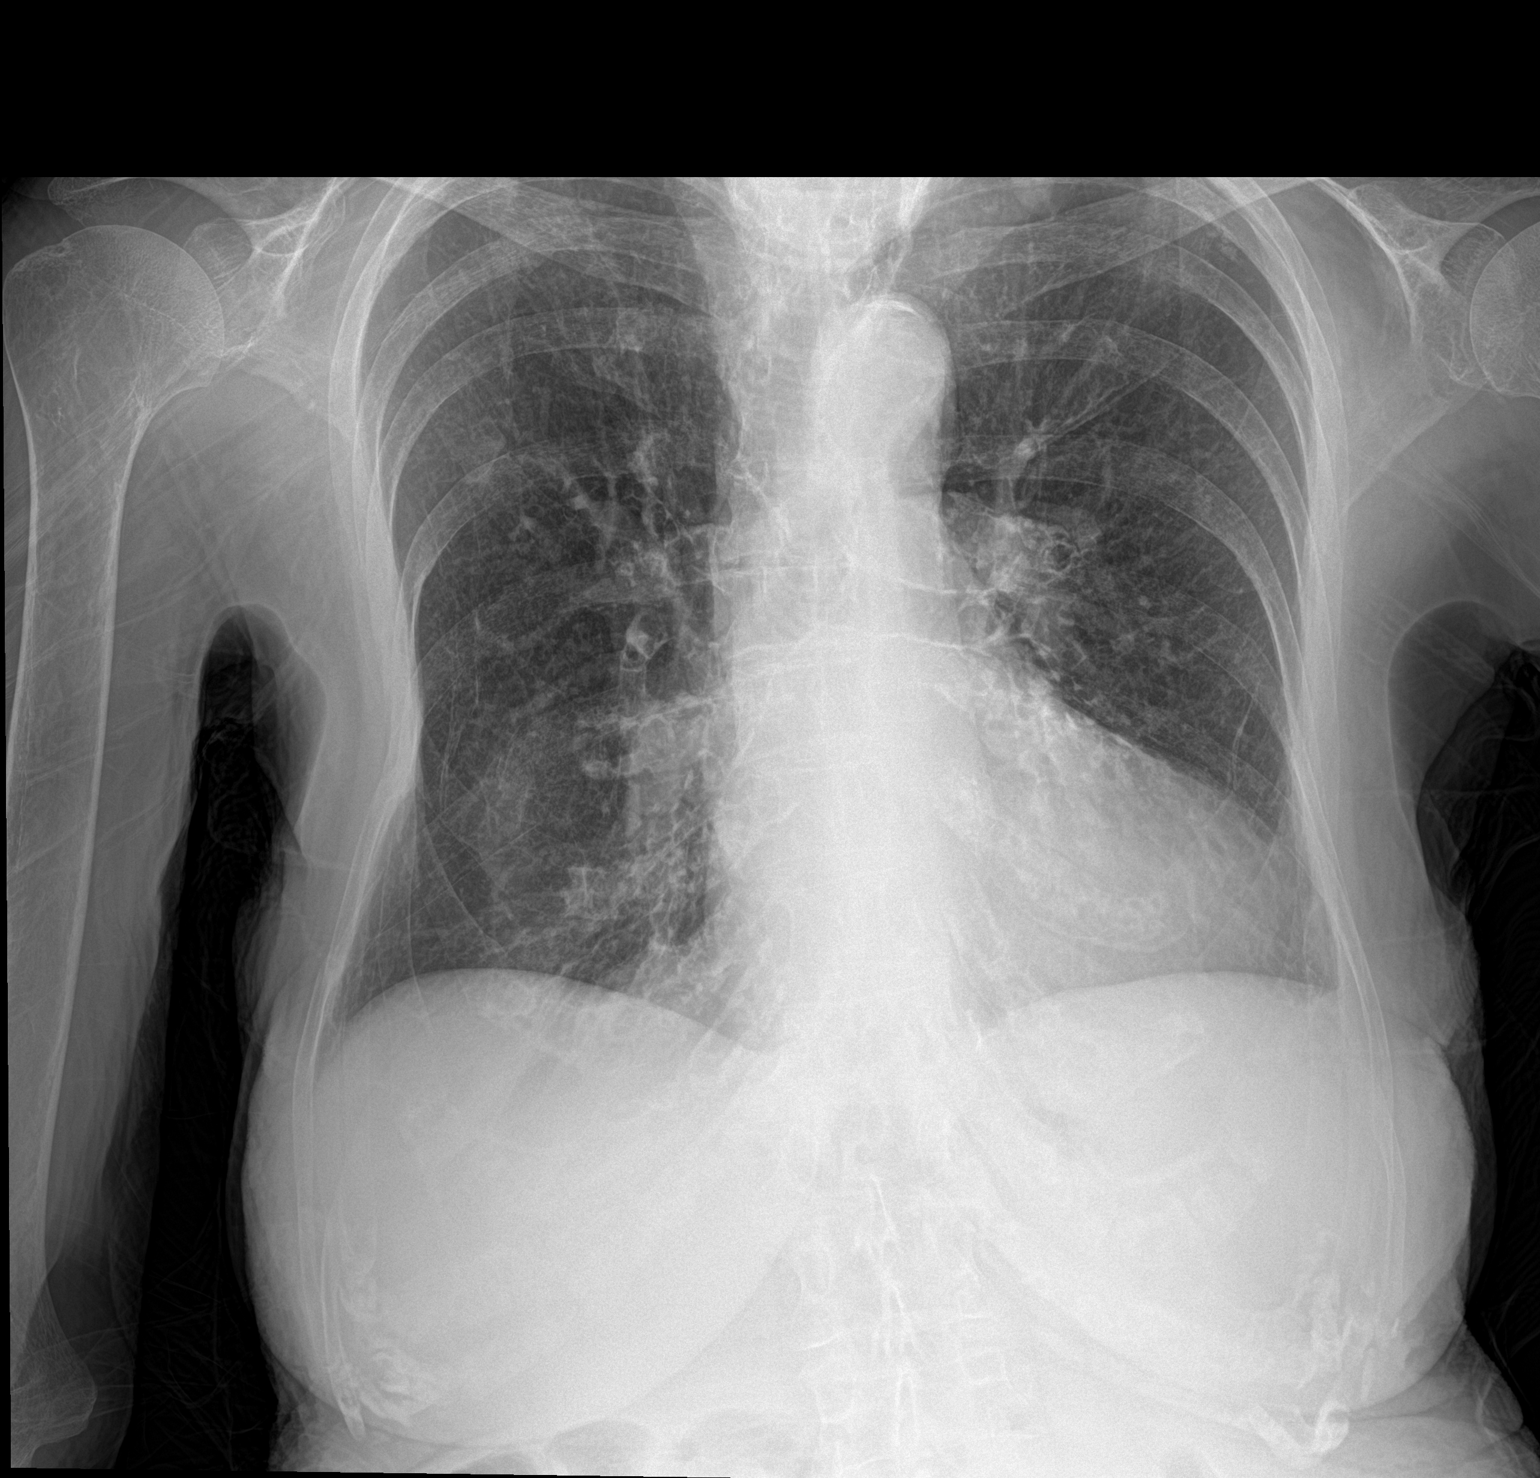

[2 of 2 positions shown; findings below may reference images not displayed]

FINDINGS: The heart is mildly enlarged. Interstitial coarsening is chronic.
Emphysematous changes are again noted. There is no edema or effusion
to suggest failure. Atherosclerotic changes are noted in the
thoracic aorta. Remote right-sided rib fracture is evident. There
may be a new right-side rib fracture as well. More likely, this was
present previously but not as well seen.
IMPRESSION: 1. Cardiomegaly without failure.
2. Emphysema.
3. Atherosclerosis of the thoracic aorta.
4. Right-sided rib fractures are likely remote. Please correlate
with acute tenderness in the right chest.

## 2018-06-05 IMAGING — CR DG HIP (WITH OR WITHOUT PELVIS) 2-3V*L*
3 series · 3 of 3 positions shown · non-contrast
Comparison: 09/13/2014

ADDENDUM:
After review of the pelvic CT, the left acetabular protrusio is
associated with a comminuted left acetabular fracture. This is
difficult to appreciate on the radiographs due to the osteopenia and
old traumatic changes. In addition, there is a fracture of the left
ilium. Refer to the pelvic CT dated 07/29/2016.
CLINICAL DATA: Weakness.  Fell with pain in the posterior left hip.

EXAM:
DG HIP (WITH OR WITHOUT PELVIS) 2-3V LEFT

[pelvis ap]
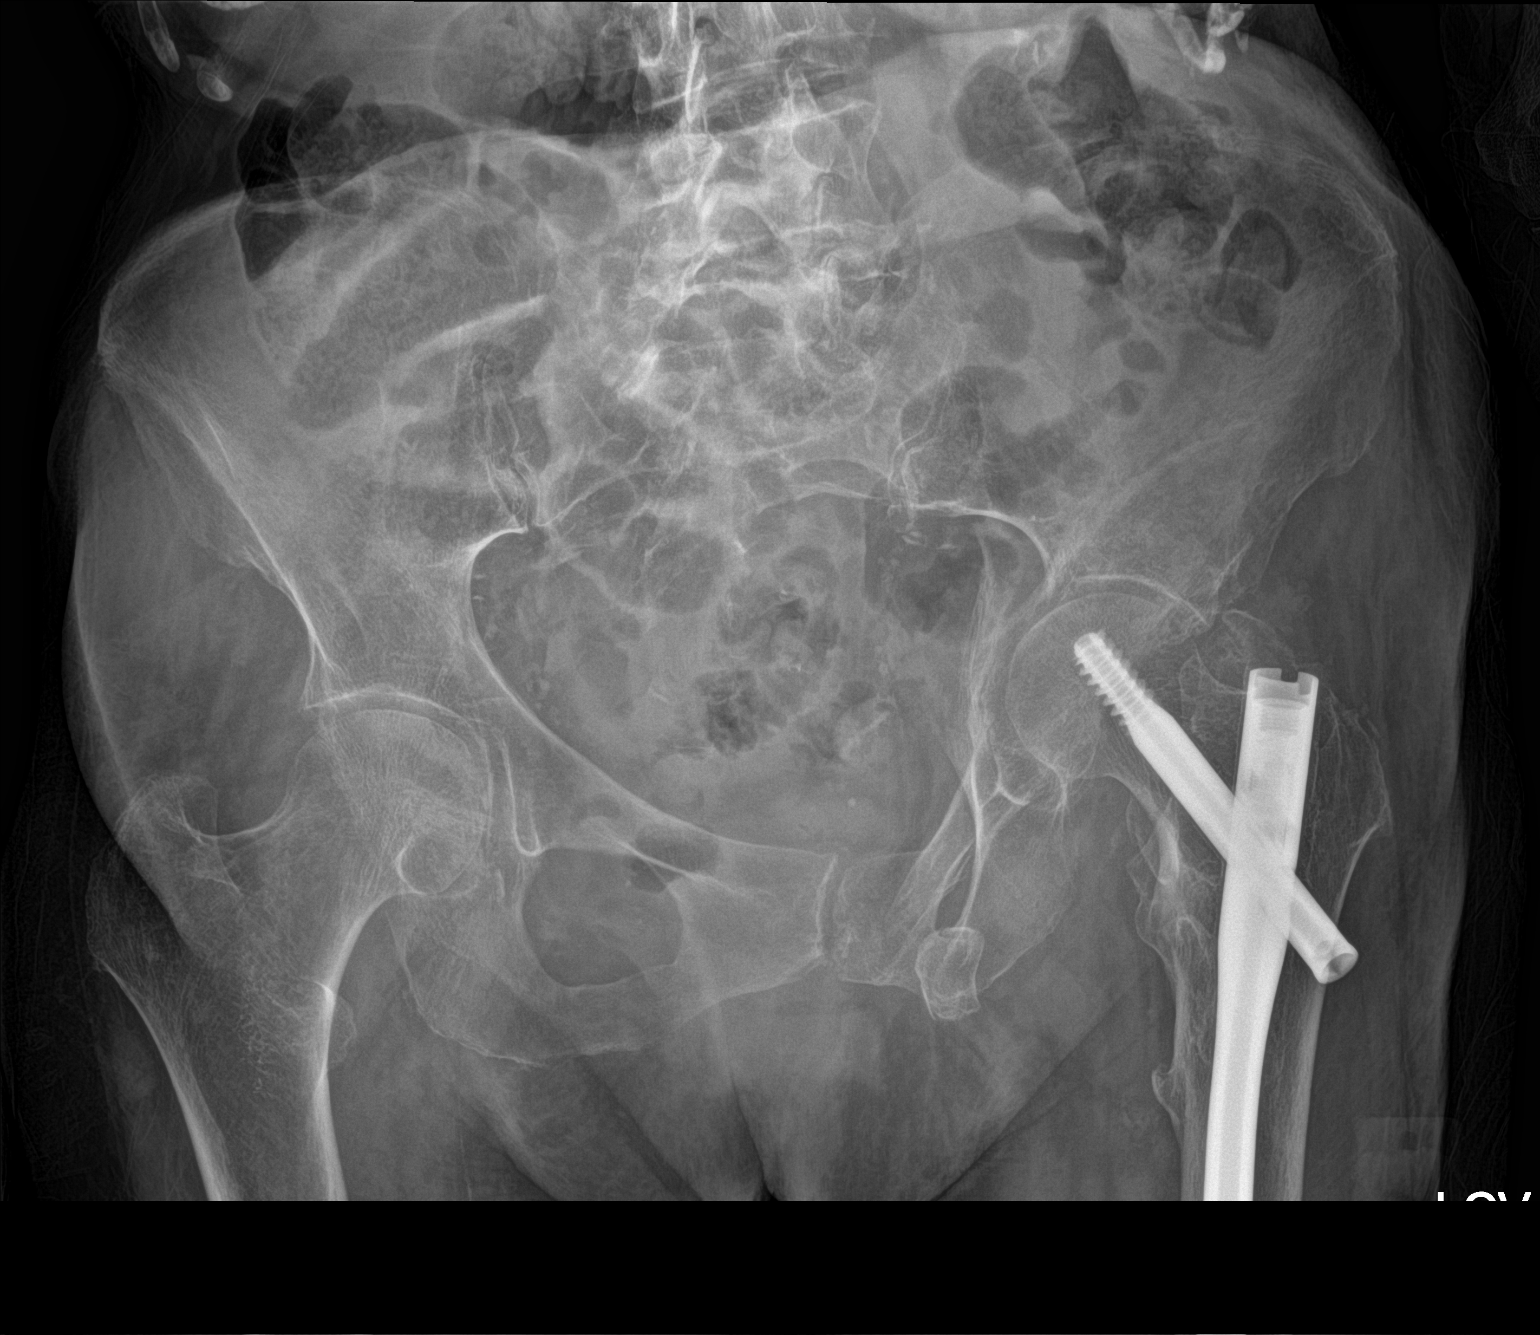

[hip ap]
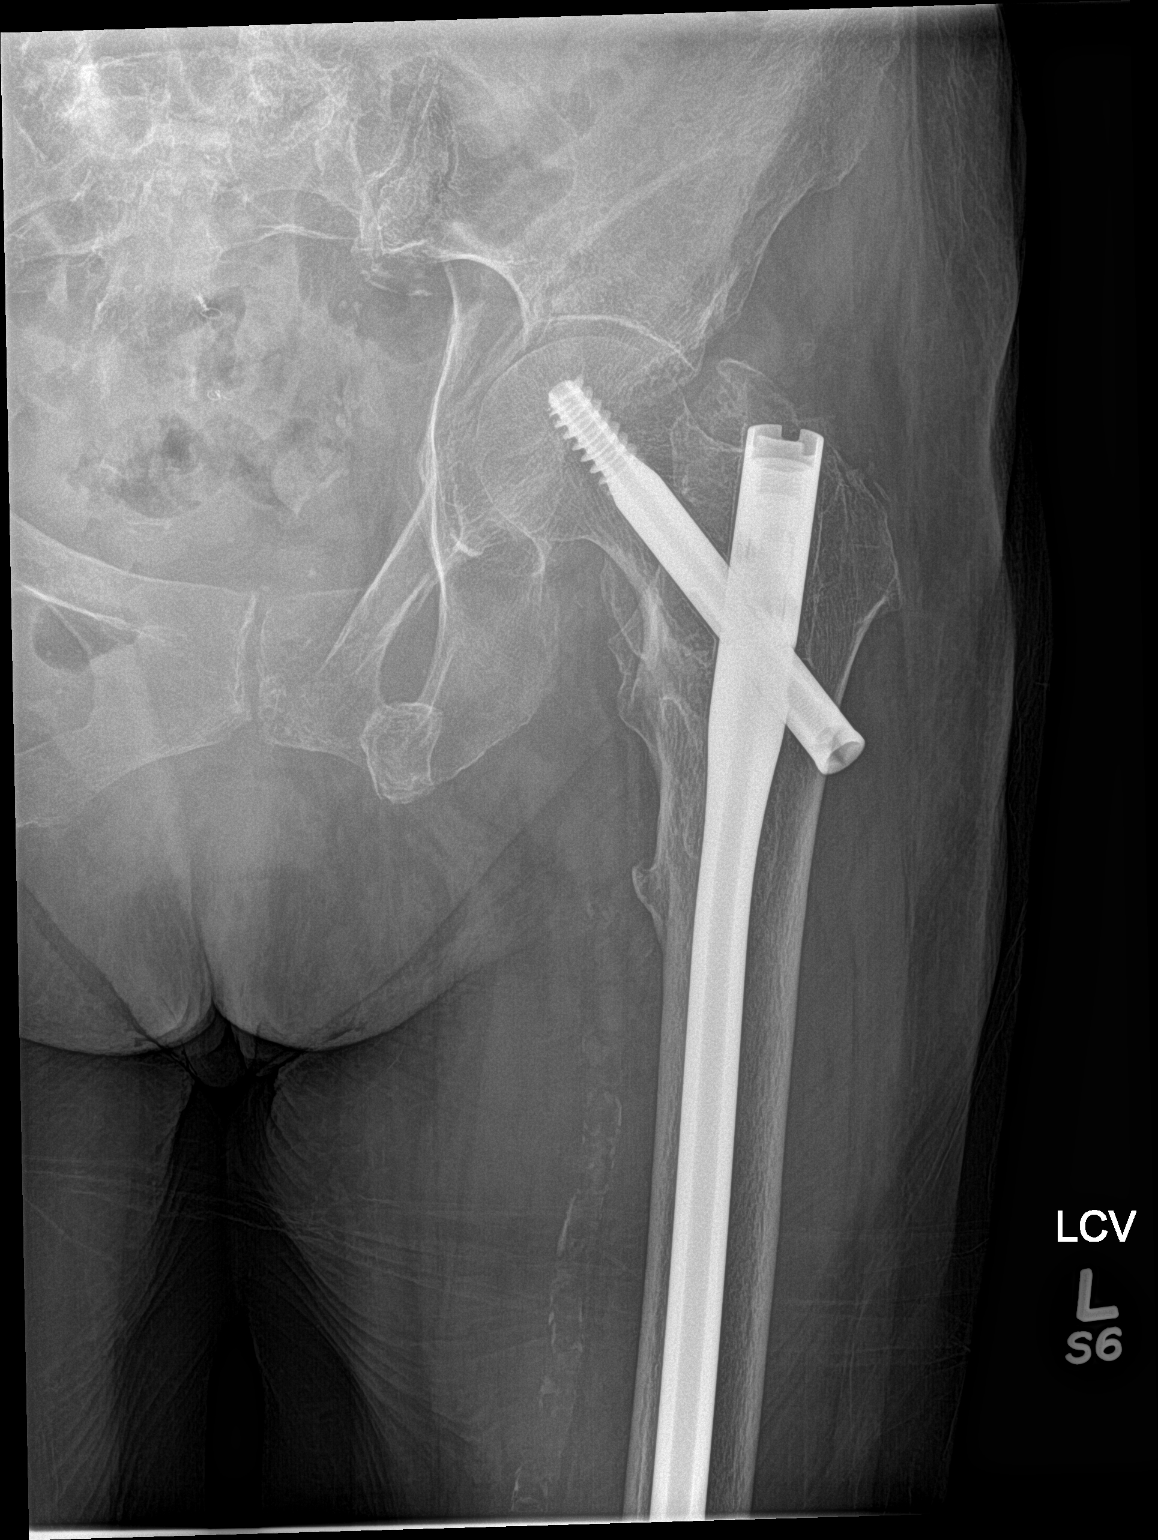

[hip lat]
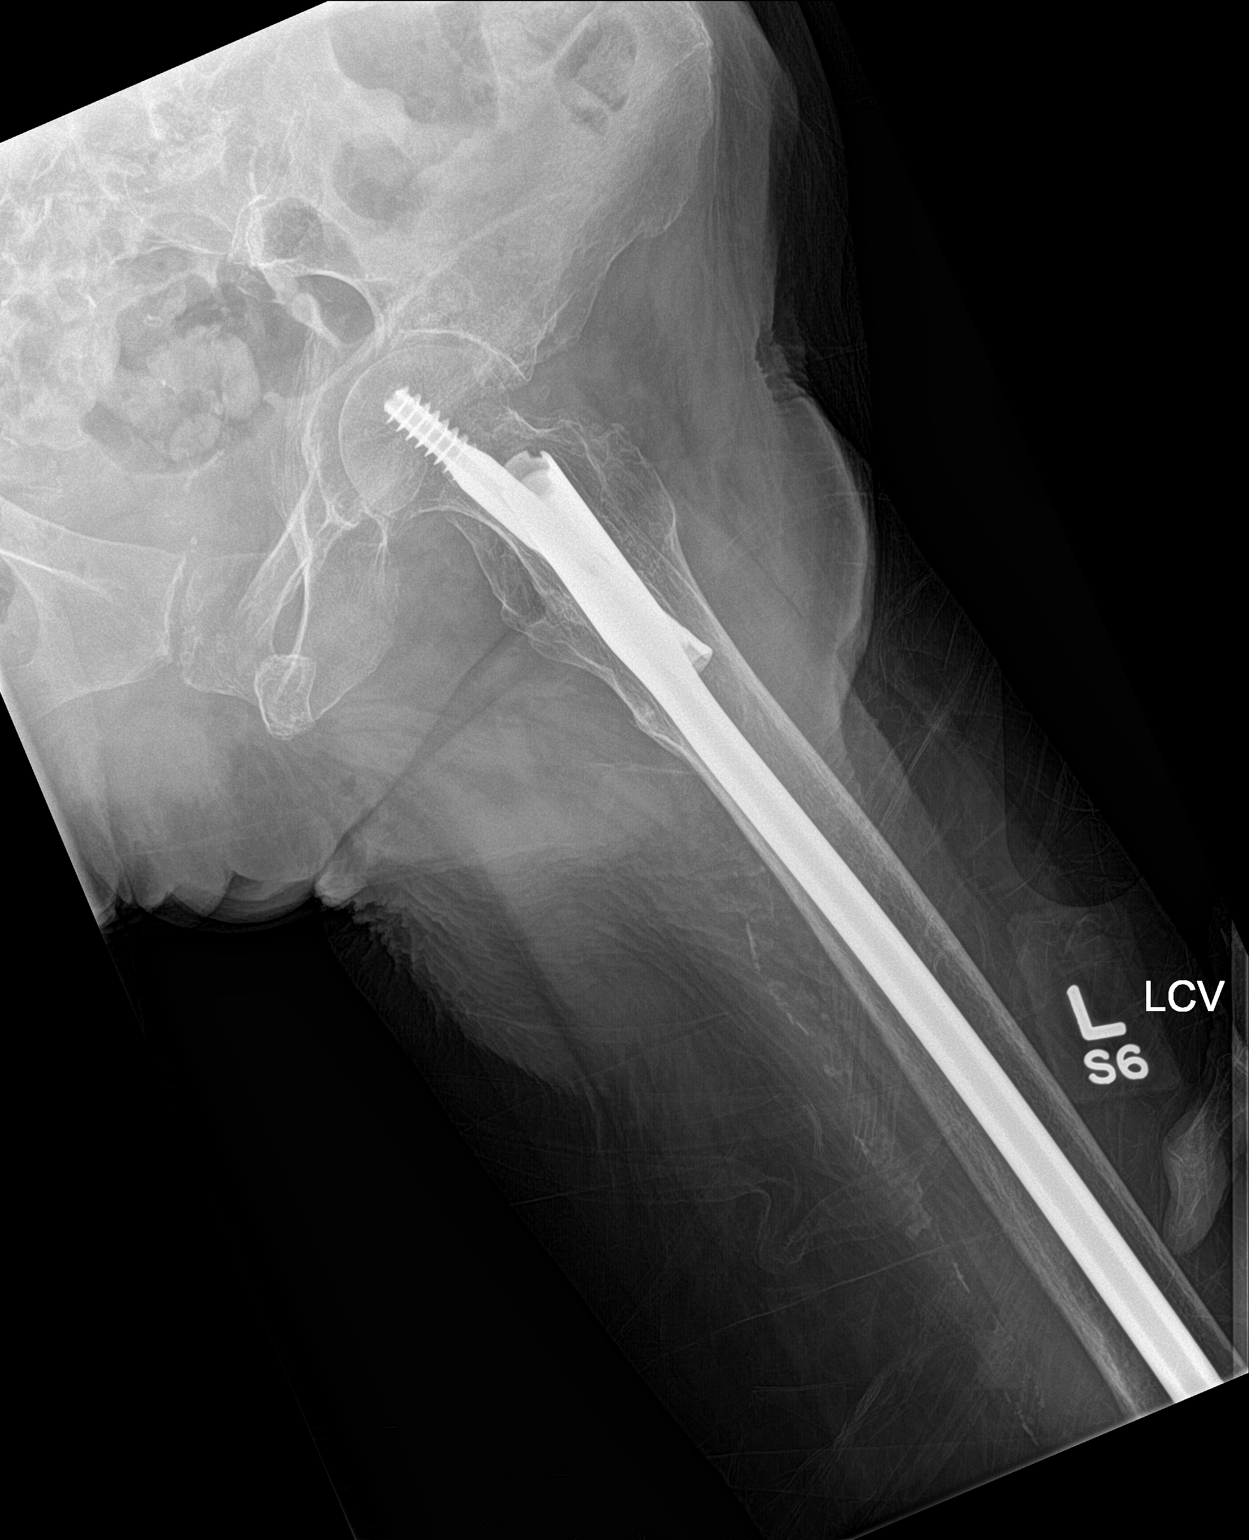

[3 of 3 positions shown; findings below may reference images not displayed]

FINDINGS: Again noted is a dynamic hip screw in the proximal left femur. Old
fractures involving the left superior and inferior pubic rami.
Patient has developed significant left acetabular protrusio since
the previous examination. The left hip appears to be located. Right
pubic rami appear to be intact. No gross abnormality to the right
hip joint.
IMPRESSION: Old left pelvic fractures with significant acetabular protrusio in
the left hip.

No acute bone abnormality identified.

## 2018-06-28 ENCOUNTER — Telehealth: Payer: Self-pay | Admitting: Family Medicine

## 2018-06-28 NOTE — Telephone Encounter (Signed)
[name] with [facility] stated she faxed forms yesterday for Dr. Rosanna Randy to sign. [name] is asking if they can be completed and sent back today if possible. Fax#[phone]. Please advise. Thanks TNP

## 2018-06-28 NOTE — Telephone Encounter (Signed)
These have been sent to the provide for signature.

## 2018-06-28 NOTE — Telephone Encounter (Signed)
[name] called back to see if form was completed because they need this for an audit. [name] stated that she is going to fax another copy of the form in case Dr. Rosanna Randy can't sign the form tomorrow a PA in the office is able to sign the form if willing. Please advise. Thanks TNP

## 2018-06-28 NOTE — Telephone Encounter (Signed)
[name] which [facility] called back for status on forms. [name] wanted to let us know this is urgent. Please advise?

## 2018-06-29 NOTE — Telephone Encounter (Signed)
Dr Rosanna Randy was given the forms and is supposed to sign them and get them back to me on Friday morning.

## 2018-07-06 ENCOUNTER — Other Ambulatory Visit: Payer: Self-pay | Admitting: Family Medicine

## 2018-07-26 ENCOUNTER — Ambulatory Visit: Payer: Self-pay | Admitting: Podiatry

## 2018-07-31 ENCOUNTER — Non-Acute Institutional Stay: Payer: Medicare Other | Admitting: Family Medicine

## 2018-07-31 DIAGNOSIS — M8000XD Age-related osteoporosis with current pathological fracture, unspecified site, subsequent encounter for fracture with routine healing: Secondary | ICD-10-CM

## 2018-07-31 DIAGNOSIS — I10 Essential (primary) hypertension: Secondary | ICD-10-CM

## 2018-07-31 DIAGNOSIS — R600 Localized edema: Secondary | ICD-10-CM

## 2018-07-31 NOTE — Progress Notes (Signed)
  Subjective:     Patient ID: [name], female   DOB: Jun 19, 1914, 84 y.o.   MRN: [id number]  HPI I am called to do a home visit to assisted living for this very pleasant 84 year old.  She is very alert but is confined to a wheelchair due to severe osteoporotic fractures of pelvis.  He sleeps well at night.  During the day she transfers to her wheelchair and is able to ambulate herself around the assisted living  using her arms. The new issue is  lower extremity swelling and edema.  No chest pain, PND, orthopnea.  No reflux symptoms but she has had a bleeding ulcer/upper GI bleed in the past.  Review of Systems See HPI    Objective:   Physical Exam Pleasant cooperative white female in no acute distress.  She is alert and oriented.  Matched orthotic/glass eye on the left.  See your regular rate and rhythm lungs are clear abdomen soft benign.  Extremities reveal may be 1+ lower extremity edema almost to the knee.  Left calf is slightly worse than right calf and is slightly tender.  Negative Homans.  Again, I feel no cords but she is tender where this might be.    Assessment:     Lower extremity venous stasis changes Try Lasix 20 mg every morning for a week or 2.  Pain baseline labs this week including a metabolic panel for renal function. Doubt DVT on the left but offered ultrasound Family will discuss this Hypertension Hypothyroid History of PUD/gastritis Lifelong Protonix.  No NSAID. Severe osteoporosis    Plan:     See above.

## 2018-08-09 ENCOUNTER — Ambulatory Visit (INDEPENDENT_AMBULATORY_CARE_PROVIDER_SITE_OTHER): Payer: Medicare Other | Admitting: Podiatry

## 2018-08-09 ENCOUNTER — Encounter: Payer: Self-pay | Admitting: Podiatry

## 2018-08-09 DIAGNOSIS — M79674 Pain in right toe(s): Secondary | ICD-10-CM

## 2018-08-09 DIAGNOSIS — M79675 Pain in left toe(s): Secondary | ICD-10-CM | POA: Diagnosis not present

## 2018-08-09 DIAGNOSIS — B351 Tinea unguium: Secondary | ICD-10-CM

## 2018-08-09 DIAGNOSIS — R609 Edema, unspecified: Secondary | ICD-10-CM

## 2018-08-09 NOTE — Progress Notes (Signed)
Complaint:  Visit Type: Patient returns to my office for continued preventative foot care services. Complaint: Patient states" my nails have grown long and thick and become painful to walk and wear shoes"  The patient presents for preventative foot care services. No changes to ROS.    Podiatric Exam: Vascular: dorsalis pedis and posterior tibial pulses are not  palpable bilateral due to severe foot swelling.l. Capillary return is immediate. Temperature gradient is WNL. Skin turgor WNL  Sensorium: Normal Semmes Weinstein monofilament test. Normal tactile sensation bilaterally. Nail Exam: Pt has thick disfigured discolored nails with subungual debris noted bilateral entire nail hallux through fifth toenails Ulcer Exam: There is no evidence of ulcer or pre-ulcerative changes or infection. Orthopedic Exam: Muscle tone and strength are WNL. No limitations in general ROM. No crepitus or effusions noted. Foot type and digits show no abnormalities. Bony prominences are unremarkable. Skin: No Porokeratosis. No infection or ulcers  Diagnosis:  Onychomycosis, , Pain in right toe, pain in left toes  Treatment & Plan Procedures and Treatment: Consent by patient was obtained for treatment procedures.   Debridement of mycotic and hypertrophic toenails, 1 through 5 bilateral and clearing of subungual debris. No ulceration, no infection noted.  Return Visit-Office Procedure: Patient instructed to return to the office for a follow up visit 4 months for continued evaluation and treatment.    Gardiner Barefoot DPM

## 2018-08-22 ENCOUNTER — Other Ambulatory Visit: Payer: Self-pay

## 2018-08-22 DIAGNOSIS — E039 Hypothyroidism, unspecified: Secondary | ICD-10-CM

## 2018-08-22 DIAGNOSIS — I10 Essential (primary) hypertension: Secondary | ICD-10-CM

## 2018-08-23 DIAGNOSIS — D485 Neoplasm of uncertain behavior of skin: Secondary | ICD-10-CM | POA: Diagnosis not present

## 2018-08-23 DIAGNOSIS — L821 Other seborrheic keratosis: Secondary | ICD-10-CM | POA: Diagnosis not present

## 2018-08-23 DIAGNOSIS — C44619 Basal cell carcinoma of skin of left upper limb, including shoulder: Secondary | ICD-10-CM | POA: Diagnosis not present

## 2018-08-23 DIAGNOSIS — I10 Essential (primary) hypertension: Secondary | ICD-10-CM | POA: Diagnosis not present

## 2018-08-23 DIAGNOSIS — E039 Hypothyroidism, unspecified: Secondary | ICD-10-CM | POA: Diagnosis not present

## 2018-08-23 DIAGNOSIS — L820 Inflamed seborrheic keratosis: Secondary | ICD-10-CM | POA: Diagnosis not present

## 2018-08-24 LAB — COMPREHENSIVE METABOLIC PANEL
ALT: 10 IU/L (ref 0–32)
AST: 16 IU/L (ref 0–40)
Albumin/Globulin Ratio: 1.2 (ref 1.2–2.2)
Albumin: 3.8 g/dL (ref 3.2–4.6)
Alkaline Phosphatase: 95 IU/L (ref 39–117)
BUN/Creatinine Ratio: 22 (ref 12–28)
BUN: 16 mg/dL (ref 10–36)
Bilirubin Total: 0.3 mg/dL (ref 0.0–1.2)
CALCIUM: 8.9 mg/dL (ref 8.7–10.3)
CO2: 21 mmol/L (ref 20–29)
CREATININE: 0.73 mg/dL (ref 0.57–1.00)
Chloride: 102 mmol/L (ref 96–106)
GFR calc Af Amer: 77 mL/min/{1.73_m2} (ref >59)
GFR calc non Af Amer: 67 mL/min/{1.73_m2} (ref >59)
GLUCOSE: 103 mg/dL — AB (ref 65–99)
Globulin, Total: 3.1 g/dL (ref 1.5–4.5)
Potassium: 3.9 mmol/L (ref 3.5–5.2)
SODIUM: 138 mmol/L (ref 134–144)
Total Protein: 6.9 g/dL (ref 6.0–8.5)

## 2018-08-24 LAB — CBC WITH DIFFERENTIAL/PLATELET
BASOS ABS: 0 10*3/uL (ref 0.0–0.2)
Basos: 0 %
EOS (ABSOLUTE): 0.2 10*3/uL (ref 0.0–0.4)
Eos: 3 %
HEMOGLOBIN: 7.9 g/dL — AB (ref 11.1–15.9)
Hematocrit: 27.8 % — ABNORMAL LOW (ref 34.0–46.6)
Immature Grans (Abs): 0 10*3/uL (ref 0.0–0.1)
Immature Granulocytes: 0 %
LYMPHS ABS: 1.8 10*3/uL (ref 0.7–3.1)
Lymphs: 24 %
MCH: 18.6 pg — ABNORMAL LOW (ref 26.6–33.0)
MCHC: 28.4 g/dL — ABNORMAL LOW (ref 31.5–35.7)
MCV: 66 fL — ABNORMAL LOW (ref 79–97)
MONOS ABS: 0.6 10*3/uL (ref 0.1–0.9)
Monocytes: 9 %
Neutrophils Absolute: 4.8 10*3/uL (ref 1.4–7.0)
Neutrophils: 64 %
Platelets: 301 10*3/uL (ref 150–450)
RBC: 4.24 x10E6/uL (ref 3.77–5.28)
RDW: 17.2 % — AB (ref 12.3–15.4)
WBC: 7.4 10*3/uL (ref 3.4–10.8)

## 2018-08-24 LAB — TSH: TSH: 4.16 u[IU]/mL (ref 0.450–4.500)

## 2018-08-27 DIAGNOSIS — C801 Malignant (primary) neoplasm, unspecified: Secondary | ICD-10-CM

## 2018-08-27 HISTORY — PX: OTHER SURGICAL HISTORY: SHX169

## 2018-08-27 HISTORY — DX: Malignant (primary) neoplasm, unspecified: C80.1

## 2018-08-29 DIAGNOSIS — M545 Low back pain: Secondary | ICD-10-CM | POA: Diagnosis not present

## 2018-08-29 DIAGNOSIS — Z87310 Personal history of (healed) osteoporosis fracture: Secondary | ICD-10-CM | POA: Diagnosis not present

## 2018-08-29 DIAGNOSIS — M6281 Muscle weakness (generalized): Secondary | ICD-10-CM | POA: Diagnosis not present

## 2018-08-29 DIAGNOSIS — M130 Polyarthritis, unspecified: Secondary | ICD-10-CM | POA: Diagnosis not present

## 2018-09-04 DIAGNOSIS — M545 Low back pain: Secondary | ICD-10-CM | POA: Diagnosis not present

## 2018-09-04 DIAGNOSIS — M6281 Muscle weakness (generalized): Secondary | ICD-10-CM | POA: Diagnosis not present

## 2018-09-04 DIAGNOSIS — Z87310 Personal history of (healed) osteoporosis fracture: Secondary | ICD-10-CM | POA: Diagnosis not present

## 2018-09-04 DIAGNOSIS — M130 Polyarthritis, unspecified: Secondary | ICD-10-CM | POA: Diagnosis not present

## 2018-09-06 DIAGNOSIS — M130 Polyarthritis, unspecified: Secondary | ICD-10-CM | POA: Diagnosis not present

## 2018-09-06 DIAGNOSIS — M545 Low back pain: Secondary | ICD-10-CM | POA: Diagnosis not present

## 2018-09-06 DIAGNOSIS — M6281 Muscle weakness (generalized): Secondary | ICD-10-CM | POA: Diagnosis not present

## 2018-09-06 DIAGNOSIS — Z87310 Personal history of (healed) osteoporosis fracture: Secondary | ICD-10-CM | POA: Diagnosis not present

## 2018-09-11 DIAGNOSIS — M545 Low back pain: Secondary | ICD-10-CM | POA: Diagnosis not present

## 2018-09-11 DIAGNOSIS — M6281 Muscle weakness (generalized): Secondary | ICD-10-CM | POA: Diagnosis not present

## 2018-09-11 DIAGNOSIS — M130 Polyarthritis, unspecified: Secondary | ICD-10-CM | POA: Diagnosis not present

## 2018-09-11 DIAGNOSIS — Z87310 Personal history of (healed) osteoporosis fracture: Secondary | ICD-10-CM | POA: Diagnosis not present

## 2018-09-14 DIAGNOSIS — M6281 Muscle weakness (generalized): Secondary | ICD-10-CM | POA: Diagnosis not present

## 2018-09-14 DIAGNOSIS — M130 Polyarthritis, unspecified: Secondary | ICD-10-CM | POA: Diagnosis not present

## 2018-09-14 DIAGNOSIS — M545 Low back pain: Secondary | ICD-10-CM | POA: Diagnosis not present

## 2018-09-14 DIAGNOSIS — Z87310 Personal history of (healed) osteoporosis fracture: Secondary | ICD-10-CM | POA: Diagnosis not present

## 2018-09-18 ENCOUNTER — Encounter: Payer: Self-pay | Admitting: Family Medicine

## 2018-09-18 ENCOUNTER — Other Ambulatory Visit (HOSPITAL_COMMUNITY)
Admission: RE | Admit: 2018-09-18 | Discharge: 2018-09-18 | Disposition: A | Discharge status: Home / Self Care | Payer: Medicare Other | Source: Ambulatory Visit | Attending: Family Medicine | Admitting: Family Medicine

## 2018-09-18 ENCOUNTER — Other Ambulatory Visit: Payer: Self-pay

## 2018-09-18 ENCOUNTER — Ambulatory Visit (INDEPENDENT_AMBULATORY_CARE_PROVIDER_SITE_OTHER): Payer: Medicare Other | Admitting: Family Medicine

## 2018-09-18 VITALS — BP 130/62 | HR 66 | Temp 97.4°F | Ht 62.0 in | Wt 100.0 lb

## 2018-09-18 DIAGNOSIS — Z85828 Personal history of other malignant neoplasm of skin: Secondary | ICD-10-CM | POA: Diagnosis not present

## 2018-09-18 DIAGNOSIS — Z79899 Other long term (current) drug therapy: Secondary | ICD-10-CM | POA: Insufficient documentation

## 2018-09-18 DIAGNOSIS — M130 Polyarthritis, unspecified: Secondary | ICD-10-CM | POA: Diagnosis not present

## 2018-09-18 DIAGNOSIS — M545 Low back pain: Secondary | ICD-10-CM | POA: Diagnosis not present

## 2018-09-18 DIAGNOSIS — Z87310 Personal history of (healed) osteoporosis fracture: Secondary | ICD-10-CM | POA: Diagnosis not present

## 2018-09-18 DIAGNOSIS — D508 Other iron deficiency anemias: Secondary | ICD-10-CM

## 2018-09-18 DIAGNOSIS — R350 Frequency of micturition: Secondary | ICD-10-CM | POA: Insufficient documentation

## 2018-09-18 DIAGNOSIS — N3001 Acute cystitis with hematuria: Secondary | ICD-10-CM | POA: Insufficient documentation

## 2018-09-18 DIAGNOSIS — Z79890 Hormone replacement therapy (postmenopausal): Secondary | ICD-10-CM | POA: Insufficient documentation

## 2018-09-18 DIAGNOSIS — N3000 Acute cystitis without hematuria: Secondary | ICD-10-CM

## 2018-09-18 DIAGNOSIS — M6281 Muscle weakness (generalized): Secondary | ICD-10-CM | POA: Diagnosis not present

## 2018-09-18 LAB — POCT URINALYSIS DIPSTICK
Glucose, UA: NEGATIVE
KETONES UA: NEGATIVE
Nitrite, UA: POSITIVE
PROTEIN UA: POSITIVE — AB
SPEC GRAV UA: 1.02 (ref 1.010–1.025)
Urobilinogen, UA: 0.2 E.U./dL
pH, UA: 5 (ref 5.0–8.0)

## 2018-09-18 MED ORDER — PHENAZOPYRIDINE HCL 200 MG PO TABS: 200.0000 mg | ORAL_TABLET | Freq: Three times a day (TID) | ORAL | 0 refills | Status: AC | PRN

## 2018-09-18 MED ORDER — AMOXICILLIN-POT CLAVULANATE 875-125 MG PO TABS: 1.0000 | ORAL_TABLET | Freq: Two times a day (BID) | ORAL | 0 refills | Status: DC

## 2018-09-18 NOTE — Progress Notes (Signed)
Patient: [name] Female    DOB: Oct 11, 1914   84 y.o.   MRN: [id number] Visit Date: 09/18/2018  Today's Provider: Vernie Murders, PA   Chief Complaint  Patient presents with  . Urinary Tract Infection    frequency, abdominal pain and cramps for several weeks   Subjective:    HPI  Pt reports she has possible UTI.  For several weeks she has had urine frequency, lower abdominal pain and cramps.  She also reports that when she is having abdominal cramps she has shooting pain and numbness and stiffness in both hands.     Past Medical History:  Diagnosis Date  . Cancer ([place]) 08/27/2018   left outer arm some type of skin cancer  . Eye infection   . Falls   . Pelvic fracture [facility])    Past Surgical History:  Procedure Laterality Date  . ABDOMINAL HYSTERECTOMY    . CESAREAN SECTION    . skin cancer removed  08/27/2018   left outer upper arm   Family History  Problem Relation Age of Onset  . Cancer Sister   . Cancer Sister      Allergies  Allergen Reactions  . Risedronate     Other reaction(s): Other (See Comments) GI upset  . Sulfa Antibiotics     Other reaction(s): Rash  . Teriparatide     Other reaction(s): Other (See Comments) GI upset     Current Outpatient Medications:  .  acetaminophen (TYLENOL) 325 MG tablet, Take by mouth., Disp: , Rfl:  .  amLODipine (NORVASC) 10 MG tablet, TAKE 1 TABLET BY MOUTH ONCE DAILY FOR HEART/BP, Disp: 30 tablet, Rfl: 11 .  cefdinir (OMNICEF) 300 MG capsule, Take 1 capsule (300 mg total) by mouth 2 (two) times daily. Take all the antibiotics in 10 days., Disp: 20 capsule, Rfl: 0 .  Cholecalciferol 4000 units CAPS, Take by mouth., Disp: , Rfl:  .  dextromethorphan (DELSYM COUGH CHILDRENS) 30 MG/5ML liquid, Take 5 mLs (30 mg total) by mouth as needed for cough. Twice a day for 1 week., Disp: 89 mL, Rfl: 0 .  diphenhydramine-acetaminophen (TYLENOL PM) 25-500 MG TABS tablet, TAKE 2 CAPTABS BY MOUTH ONCE DAILY AT BEDTIME AS  NEEDED FOR SLEEP, Disp: 100 tablet, Rfl: 11 .  GNP ARTHRITIS PAIN RELIEF 650 MG CR tablet, TAKE 1 TABLET BY MOUTH EVERY 4 HOURS AS NEEDED FOR PAIN. MAX 5/DAY: OTHER SOURCES OF ACETAMINOPHEN., Disp: 100 tablet, Rfl: 5 .  guaiFENesin-dextromethorphan (ROBITUSSIN DM) 100-10 MG/5ML syrup, Take 5 mLs by mouth every 4 (four) hours as needed for cough., Disp: 118 mL, Rfl: 0 .  levobunolol (BETAGAN) 0.5 % ophthalmic solution, Place 1 drop into both eyes 2 (two) times daily. , Disp: , Rfl:  .  levothyroxine (SYNTHROID, LEVOTHROID) 50 MCG tablet, TAKE 1 TABLET BY MOUTH ONCE DAILY FOR HYPOTHYROIDISM.  ** GETS MEDS FROM MAILORDER **, Disp: 30 tablet, Rfl: 11 .  loratadine (CLARITIN) 10 MG tablet, Take 1 tablet (10 mg total) by mouth daily. For allergic rhinitis when it flares - may use as needed once a day., Disp: 30 tablet, Rfl: 11 .  metoprolol succinate (TOPROL-XL) 50 MG 24 hr tablet, , Disp: , Rfl:  .  pantoprazole (PROTONIX) 40 MG tablet, Take 1 tablet (40 mg total) by mouth 2 (two) times daily., Disp: 180 tablet, Rfl: 3 .  ramipril (ALTACE) 5 MG capsule, TAKE 1 CAPSULE BY MOUTH ONCE DAILY FOR BLOOD PRESSURE, Disp: 30 capsule, Rfl: 10 .  feeding supplement, ENSURE ENLIVE, (ENSURE ENLIVE) LIQD, Take 237 mLs by mouth 3 (three) times daily with meals. (Patient not taking: Reported on 07/29/2016), Disp: 237 mL, Rfl: 12 .  moxifloxacin (VIGAMOX) 0.5 % ophthalmic solution, INSTILL 1 DROP INTO BOTH EYES TWICE A DAY, Disp: 3 mL, Rfl: 11 .  predniSONE (DELTASONE) 5 MG tablet, 2 tablets breakfast , lunch and dinner- day 1  2 tablets breakfast & lunch then 1 at dinner - day 2 1 tablet breakfast, lunch, dinner & bedtime - day 3 1 tablet breakfast, lunch & dinner - day 4 1 tablet breakfast & dinner - day 5 1 tablet breakfast - day 6 All doses by mouth. (Patient not taking: Reported on 09/18/2018), Disp: 21 tablet, Rfl: 0  Review of Systems  Constitutional: Negative.   HENT: Negative.   Eyes: Negative.   Cardiovascular:  Negative.   Gastrointestinal: Negative.   Endocrine: Negative.   Genitourinary: Positive for difficulty urinating, frequency and pelvic pain. Negative for decreased urine volume, dyspareunia, dysuria, enuresis, flank pain, genital sores, hematuria, menstrual problem, urgency, vaginal bleeding, vaginal discharge and vaginal pain.  Musculoskeletal: Negative.   Skin: Negative.   Allergic/Immunologic: Negative.   Neurological: Negative.   Hematological: Negative.   Psychiatric/Behavioral: Negative.     Social History   Tobacco Use  . Smoking status: Never Smoker  . Smokeless tobacco: Never Used  Substance Use Topics  . Alcohol use: No   Objective:   BP 130/62 (BP Location: Right Arm, Patient Position: Sitting, Cuff Size: Normal)   Pulse 66   Temp (!) 97.4 F (36.3 C) (Oral)   Ht 5\' 2"  (1.575 m)   Wt 100 lb (45.4 kg)   LMP  (LMP Unknown)   SpO2 97%   BMI 18.29 kg/m  Vitals:   09/18/18 1024  BP: 130/62  Pulse: 66  Temp: (!) 97.4 F (36.3 C)  TempSrc: Oral  SpO2: 97%  Weight: 100 lb (45.4 kg)  Height: 5\' 2"  (1.575 m)     Physical Exam  Constitutional: She is oriented to person, place, and time. She appears well-developed and well-nourished. No distress.  HENT:  Head: Normocephalic and atraumatic.  Right Ear: Hearing normal.  Left Ear: Hearing normal.  Nose: Nose normal.  Eyes: Conjunctivae and lids are normal. Right eye exhibits no discharge. Left eye exhibits no discharge. No scleral icterus.  Pulmonary/Chest: Effort normal. No respiratory distress.  Abdominal: Soft. Bowel sounds are normal.  No discomfort at the present.  Neurological: She is alert and oriented to person, place, and time.  Skin: Skin is intact. No lesion and no rash noted.  Psychiatric: She has a normal mood and affect. Her speech is normal and behavior is normal. Thought content normal.      Assessment & Plan:     1. Frequency of urination Onset over the past 2-3 weeks. No hematuria or  incontinence. Urinalysis confirms UTI. Treat with antibiotic and get culture. Must drink extra water to flush out urinary system.  - POCT Urinalysis Dipstick - Urine Culture  2. Acute cystitis without hematuria Onset the past 2-3 weeks with urinary frequency and some tingling in hands similar to past UTI's. Urinalysis confirms with positive nitrites and leukocytes. Get C&S and increase fluid intake. May use Pyridium prn discomfort and start Augmentin. Recheck pending lab reports. - Urine Culture - amoxicillin-clavulanate (AUGMENTIN) 875-125 MG tablet; Take 1 tablet by mouth 2 (two) times daily.  Dispense: 14 tablet; Refill: 0 - CBC with Differential/Platelet  3. Iron deficiency  anemia secondary to inadequate dietary iron intake Feeling well and continues to take iron supplement daily. Will recheck labs and follow up pending reports. - CBC with Differential/Platelet - Iron - Iron and TIBC         Vernie Murders, [place]

## 2018-09-19 DIAGNOSIS — M130 Polyarthritis, unspecified: Secondary | ICD-10-CM | POA: Diagnosis not present

## 2018-09-19 DIAGNOSIS — M545 Low back pain: Secondary | ICD-10-CM | POA: Diagnosis not present

## 2018-09-19 DIAGNOSIS — M6281 Muscle weakness (generalized): Secondary | ICD-10-CM | POA: Diagnosis not present

## 2018-09-19 DIAGNOSIS — Z87310 Personal history of (healed) osteoporosis fracture: Secondary | ICD-10-CM | POA: Diagnosis not present

## 2018-09-19 LAB — IRON AND TIBC
Iron Saturation: 40 % (ref 15–55)
Iron: 150 ug/dL — ABNORMAL HIGH (ref 27–139)
Total Iron Binding Capacity: 372 ug/dL (ref 250–450)
UIBC: 222 ug/dL (ref 118–369)

## 2018-09-19 LAB — CBC WITH DIFFERENTIAL/PLATELET
BASOS ABS: 0 10*3/uL (ref 0.0–0.2)
Basos: 0 %
EOS (ABSOLUTE): 0.2 10*3/uL (ref 0.0–0.4)
Eos: 3 %
Hematocrit: 32.5 % — ABNORMAL LOW (ref 34.0–46.6)
Hemoglobin: 9.3 g/dL — ABNORMAL LOW (ref 11.1–15.9)
IMMATURE GRANULOCYTES: 0 %
Immature Grans (Abs): 0 10*3/uL (ref 0.0–0.1)
Lymphocytes Absolute: 2.2 10*3/uL (ref 0.7–3.1)
Lymphs: 24 %
MCH: 19.5 pg — ABNORMAL LOW (ref 26.6–33.0)
MCHC: 28.6 g/dL — ABNORMAL LOW (ref 31.5–35.7)
MCV: 68 fL — ABNORMAL LOW (ref 79–97)
Monocytes Absolute: 0.7 10*3/uL (ref 0.1–0.9)
Monocytes: 8 %
NEUTROS PCT: 65 %
Neutrophils Absolute: 6.1 10*3/uL (ref 1.4–7.0)
PLATELETS: 307 10*3/uL (ref 150–450)
RBC: 4.76 x10E6/uL (ref 3.77–5.28)
RDW: 22.7 % — ABNORMAL HIGH (ref 12.3–15.4)
WBC: 9.3 10*3/uL (ref 3.4–10.8)

## 2018-09-20 ENCOUNTER — Telehealth: Payer: Self-pay

## 2018-09-20 ENCOUNTER — Other Ambulatory Visit: Payer: Self-pay | Admitting: Family Medicine

## 2018-09-20 LAB — URINE CULTURE: Culture: >=100000 — AB

## 2018-09-20 MED ORDER — CEFUROXIME AXETIL 250 MG PO TABS: 250.0000 mg | ORAL_TABLET | Freq: Two times a day (BID) | ORAL | 0 refills | Status: DC

## 2018-09-20 NOTE — Telephone Encounter (Signed)
Patient son [name] was advised. [name] states that prescription has to be faxed to [facility] in order for medication to be admister to patient.

## 2018-09-20 NOTE — Telephone Encounter (Signed)
-----   Message from Margo Common, [place] sent at 09/20/2018 11:25 AM EDT ----- Anemia is improving with vitamin/iron supplement. Good iron level. Continue present regimen and recheck progress in 3 months. Urine culture isolated E.coli bacteria that is a little resistant to the Augmentin given. Should switch to Ceftin 250 mg BID (sent to the [facility] at [facility]) and recheck urine in 7-10 days if any symptoms remain.

## 2018-09-20 NOTE — Telephone Encounter (Signed)
Please have this faxed.thanks

## 2018-09-21 ENCOUNTER — Telehealth: Payer: Self-pay | Admitting: Family Medicine

## 2018-09-21 NOTE — Telephone Encounter (Signed)
Prescription written to be faxed to [facility].

## 2018-09-21 NOTE — Telephone Encounter (Signed)
Advised as below that patient is to d/c augmentin and continue ceftin.

## 2018-09-21 NOTE — Telephone Encounter (Signed)
Prescription was faxed. PC

## 2018-09-21 NOTE — Telephone Encounter (Signed)
Pt has been on an antibiotic - Amoxicillin.   Pt was prescribed Ceftin 250mg . The facility is needing to know if she needs to take both at the same time or if she needs to stop /DC the amoxocillin and continue the ceftin?  Please advise asap.  Thanks, [employer]

## 2018-09-25 DIAGNOSIS — M6281 Muscle weakness (generalized): Secondary | ICD-10-CM | POA: Diagnosis not present

## 2018-09-25 DIAGNOSIS — M130 Polyarthritis, unspecified: Secondary | ICD-10-CM | POA: Diagnosis not present

## 2018-09-25 DIAGNOSIS — M545 Low back pain: Secondary | ICD-10-CM | POA: Diagnosis not present

## 2018-09-25 DIAGNOSIS — Z87310 Personal history of (healed) osteoporosis fracture: Secondary | ICD-10-CM | POA: Diagnosis not present

## 2018-09-28 ENCOUNTER — Other Ambulatory Visit: Payer: Self-pay | Admitting: Family Medicine

## 2018-09-28 DIAGNOSIS — M545 Low back pain: Secondary | ICD-10-CM | POA: Diagnosis not present

## 2018-09-28 DIAGNOSIS — M6281 Muscle weakness (generalized): Secondary | ICD-10-CM | POA: Diagnosis not present

## 2018-09-28 DIAGNOSIS — M130 Polyarthritis, unspecified: Secondary | ICD-10-CM | POA: Diagnosis not present

## 2018-09-28 DIAGNOSIS — Z87310 Personal history of (healed) osteoporosis fracture: Secondary | ICD-10-CM | POA: Diagnosis not present

## 2018-10-01 ENCOUNTER — Telehealth: Payer: Self-pay | Admitting: Family Medicine

## 2018-10-01 DIAGNOSIS — H10501 Unspecified blepharoconjunctivitis, right eye: Secondary | ICD-10-CM | POA: Diagnosis not present

## 2018-10-01 NOTE — Telephone Encounter (Signed)
Patient's daughter called to tell us that the patient broke out in a rash on the Ceftin.  She had completed the course by the time the patient realized her itching was from a rash.  Her daughter just wanted [place] to know so it could be put on her record.  The itching and rash are improving with the use of some cortisone cream.  Also the facility has faxed an order requesting Tylenol PM for sleep. If you would please give to me after signing Thanks

## 2018-10-01 NOTE — Telephone Encounter (Signed)
Pt's daughter [name] called saying mom broke out in a rash after she finished an antibiotic.  She was given the antibiotic by [place].    She has been on Ceftin since 10/10.  She does not have any other symptons.    Please advise [phone]    Thanks Con Memos

## 2018-10-03 ENCOUNTER — Other Ambulatory Visit: Payer: Self-pay | Admitting: Family Medicine

## 2018-10-03 DIAGNOSIS — M6281 Muscle weakness (generalized): Secondary | ICD-10-CM | POA: Diagnosis not present

## 2018-10-03 DIAGNOSIS — Z87310 Personal history of (healed) osteoporosis fracture: Secondary | ICD-10-CM | POA: Diagnosis not present

## 2018-10-03 DIAGNOSIS — M545 Low back pain: Secondary | ICD-10-CM | POA: Diagnosis not present

## 2018-10-03 DIAGNOSIS — M130 Polyarthritis, unspecified: Secondary | ICD-10-CM | POA: Diagnosis not present

## 2018-10-04 DIAGNOSIS — M545 Low back pain: Secondary | ICD-10-CM | POA: Diagnosis not present

## 2018-10-04 DIAGNOSIS — M6281 Muscle weakness (generalized): Secondary | ICD-10-CM | POA: Diagnosis not present

## 2018-10-04 DIAGNOSIS — M130 Polyarthritis, unspecified: Secondary | ICD-10-CM | POA: Diagnosis not present

## 2018-10-04 DIAGNOSIS — Z87310 Personal history of (healed) osteoporosis fracture: Secondary | ICD-10-CM | POA: Diagnosis not present

## 2018-10-09 DIAGNOSIS — M545 Low back pain: Secondary | ICD-10-CM | POA: Diagnosis not present

## 2018-10-09 DIAGNOSIS — Z87310 Personal history of (healed) osteoporosis fracture: Secondary | ICD-10-CM | POA: Diagnosis not present

## 2018-10-09 DIAGNOSIS — M130 Polyarthritis, unspecified: Secondary | ICD-10-CM | POA: Diagnosis not present

## 2018-10-09 DIAGNOSIS — M6281 Muscle weakness (generalized): Secondary | ICD-10-CM | POA: Diagnosis not present

## 2018-10-10 DIAGNOSIS — M6281 Muscle weakness (generalized): Secondary | ICD-10-CM | POA: Diagnosis not present

## 2018-10-10 DIAGNOSIS — M545 Low back pain: Secondary | ICD-10-CM | POA: Diagnosis not present

## 2018-10-10 DIAGNOSIS — Z87310 Personal history of (healed) osteoporosis fracture: Secondary | ICD-10-CM | POA: Diagnosis not present

## 2018-10-10 DIAGNOSIS — M130 Polyarthritis, unspecified: Secondary | ICD-10-CM | POA: Diagnosis not present

## 2018-10-15 ENCOUNTER — Other Ambulatory Visit: Payer: Self-pay | Admitting: Family Medicine

## 2018-10-15 DIAGNOSIS — M545 Low back pain: Secondary | ICD-10-CM | POA: Diagnosis not present

## 2018-10-15 DIAGNOSIS — Z87310 Personal history of (healed) osteoporosis fracture: Secondary | ICD-10-CM | POA: Diagnosis not present

## 2018-10-15 DIAGNOSIS — M130 Polyarthritis, unspecified: Secondary | ICD-10-CM | POA: Diagnosis not present

## 2018-10-15 DIAGNOSIS — M6281 Muscle weakness (generalized): Secondary | ICD-10-CM | POA: Diagnosis not present

## 2018-10-18 DIAGNOSIS — M545 Low back pain: Secondary | ICD-10-CM | POA: Diagnosis not present

## 2018-10-18 DIAGNOSIS — Z87310 Personal history of (healed) osteoporosis fracture: Secondary | ICD-10-CM | POA: Diagnosis not present

## 2018-10-18 DIAGNOSIS — M130 Polyarthritis, unspecified: Secondary | ICD-10-CM | POA: Diagnosis not present

## 2018-10-18 DIAGNOSIS — M6281 Muscle weakness (generalized): Secondary | ICD-10-CM | POA: Diagnosis not present

## 2018-10-23 DIAGNOSIS — M130 Polyarthritis, unspecified: Secondary | ICD-10-CM | POA: Diagnosis not present

## 2018-10-23 DIAGNOSIS — M6281 Muscle weakness (generalized): Secondary | ICD-10-CM | POA: Diagnosis not present

## 2018-10-23 DIAGNOSIS — M545 Low back pain: Secondary | ICD-10-CM | POA: Diagnosis not present

## 2018-10-23 DIAGNOSIS — Z87310 Personal history of (healed) osteoporosis fracture: Secondary | ICD-10-CM | POA: Diagnosis not present

## 2018-10-25 DIAGNOSIS — Z87310 Personal history of (healed) osteoporosis fracture: Secondary | ICD-10-CM | POA: Diagnosis not present

## 2018-10-25 DIAGNOSIS — M6281 Muscle weakness (generalized): Secondary | ICD-10-CM | POA: Diagnosis not present

## 2018-10-25 DIAGNOSIS — M545 Low back pain: Secondary | ICD-10-CM | POA: Diagnosis not present

## 2018-10-25 DIAGNOSIS — M130 Polyarthritis, unspecified: Secondary | ICD-10-CM | POA: Diagnosis not present

## 2018-10-29 ENCOUNTER — Telehealth: Payer: Self-pay | Admitting: Family Medicine

## 2018-10-29 DIAGNOSIS — L820 Inflamed seborrheic keratosis: Secondary | ICD-10-CM | POA: Diagnosis not present

## 2018-10-29 DIAGNOSIS — Z85828 Personal history of other malignant neoplasm of skin: Secondary | ICD-10-CM | POA: Diagnosis not present

## 2018-10-29 DIAGNOSIS — L578 Other skin changes due to chronic exposure to nonionizing radiation: Secondary | ICD-10-CM | POA: Diagnosis not present

## 2018-10-29 NOTE — Telephone Encounter (Signed)
OK to fax Rx for this

## 2018-10-29 NOTE — Telephone Encounter (Signed)
[facility] is at [facility]. [name] wants a prescription for a "lift chair" faxed to  @ [phone] [facility].  They are coming to [facility] tomorrow to measure for her a chair .   [name] said he needed the rx faxed by tomorrow.

## 2018-10-29 NOTE — Telephone Encounter (Signed)
Ok to write Rx?

## 2018-10-29 NOTE — Telephone Encounter (Signed)
Written Rx and faxed to number below.

## 2018-11-05 ENCOUNTER — Telehealth: Payer: Self-pay

## 2018-11-05 NOTE — Telephone Encounter (Signed)
Patients son [name] called wanting an update on the medicare form he dropped off last week for patients lift chair. He states the company received the prescription, but medicare requires that form to be completed also. Please call patient son back with an update asap. Thanks.

## 2018-11-07 NOTE — Telephone Encounter (Signed)
Advised son that form was completed and faxed. Also advised that this may require a face to face visit. Patient's son reports that a representative from the company has already been out to the facility where she is and documented the need and measured her specifically for a special lift chair. She would not need to see Dr. Rosanna Randy for this.

## 2018-11-12 ENCOUNTER — Telehealth: Payer: Self-pay | Admitting: Family Medicine

## 2018-11-12 NOTE — Telephone Encounter (Signed)
D/C

## 2018-11-12 NOTE — Telephone Encounter (Signed)
Please review. Thanks!  

## 2018-11-12 NOTE — Telephone Encounter (Signed)
[name] w/ [facility] [phone]  Iron Pill  - Pt won't take the medication because she believes it causes constipation.  Needing a DC or another alterative.  Please advise.  Thanks, [employer]

## 2018-11-13 DIAGNOSIS — N390 Urinary tract infection, site not specified: Secondary | ICD-10-CM | POA: Diagnosis not present

## 2018-11-13 DIAGNOSIS — Z9181 History of falling: Secondary | ICD-10-CM | POA: Diagnosis not present

## 2018-11-13 DIAGNOSIS — M6281 Muscle weakness (generalized): Secondary | ICD-10-CM | POA: Diagnosis not present

## 2018-11-13 DIAGNOSIS — M810 Age-related osteoporosis without current pathological fracture: Secondary | ICD-10-CM | POA: Diagnosis not present

## 2018-11-13 DIAGNOSIS — R2681 Unsteadiness on feet: Secondary | ICD-10-CM | POA: Diagnosis not present

## 2018-11-13 NOTE — Telephone Encounter (Signed)
Order faxed.

## 2018-11-14 ENCOUNTER — Telehealth: Payer: Self-pay | Admitting: Family Medicine

## 2018-11-14 DIAGNOSIS — M810 Age-related osteoporosis without current pathological fracture: Secondary | ICD-10-CM | POA: Diagnosis not present

## 2018-11-14 DIAGNOSIS — R2681 Unsteadiness on feet: Secondary | ICD-10-CM | POA: Diagnosis not present

## 2018-11-14 DIAGNOSIS — M6281 Muscle weakness (generalized): Secondary | ICD-10-CM | POA: Diagnosis not present

## 2018-11-14 DIAGNOSIS — Z9181 History of falling: Secondary | ICD-10-CM | POA: Diagnosis not present

## 2018-11-14 DIAGNOSIS — N390 Urinary tract infection, site not specified: Secondary | ICD-10-CM | POA: Diagnosis not present

## 2018-11-14 NOTE — Telephone Encounter (Signed)
Butch Penny said this was URGENT and wants to have an order to d/c Iron because patient is refusing to take it.

## 2018-11-14 NOTE — Telephone Encounter (Signed)
[name] w/ [facility] [employer] [phone] Is still needing the form for the lift chair to be completed.  There are 4 or so questions that need to be answered.  It was signed but answers not filled out. [name] is faxing another copy to be completed.  Thanks, [employer]

## 2018-11-14 NOTE — Telephone Encounter (Signed)
Advised [facility] that this should not be considered an urgent matter and that we had actually already sent the order to them.  [name] said she had to call several times about this but there is only 1 message and that was the one yesterday.  When explaining to her we only had 1 message she hung up

## 2018-11-15 DIAGNOSIS — R2681 Unsteadiness on feet: Secondary | ICD-10-CM | POA: Diagnosis not present

## 2018-11-15 DIAGNOSIS — N390 Urinary tract infection, site not specified: Secondary | ICD-10-CM | POA: Diagnosis not present

## 2018-11-15 DIAGNOSIS — M810 Age-related osteoporosis without current pathological fracture: Secondary | ICD-10-CM | POA: Diagnosis not present

## 2018-11-15 DIAGNOSIS — Z9181 History of falling: Secondary | ICD-10-CM | POA: Diagnosis not present

## 2018-11-15 DIAGNOSIS — M6281 Muscle weakness (generalized): Secondary | ICD-10-CM | POA: Diagnosis not present

## 2018-11-19 ENCOUNTER — Telehealth: Payer: Self-pay | Admitting: Family Medicine

## 2018-11-19 NOTE — Telephone Encounter (Signed)
Paperwork was resigned and faxed to [phone] and [phone].

## 2018-11-19 NOTE — Telephone Encounter (Signed)
Donella Stade [facility] [phone]  Form regarding lift chair.  Needing paperwork checked and resent.  Please advise.  Thanks, tgh

## 2018-11-20 DIAGNOSIS — R2681 Unsteadiness on feet: Secondary | ICD-10-CM | POA: Diagnosis not present

## 2018-11-20 DIAGNOSIS — M6281 Muscle weakness (generalized): Secondary | ICD-10-CM | POA: Diagnosis not present

## 2018-11-20 DIAGNOSIS — Z9181 History of falling: Secondary | ICD-10-CM | POA: Diagnosis not present

## 2018-11-20 DIAGNOSIS — N390 Urinary tract infection, site not specified: Secondary | ICD-10-CM | POA: Diagnosis not present

## 2018-11-20 DIAGNOSIS — M810 Age-related osteoporosis without current pathological fracture: Secondary | ICD-10-CM | POA: Diagnosis not present

## 2018-11-21 DIAGNOSIS — M810 Age-related osteoporosis without current pathological fracture: Secondary | ICD-10-CM | POA: Diagnosis not present

## 2018-11-21 DIAGNOSIS — R2681 Unsteadiness on feet: Secondary | ICD-10-CM | POA: Diagnosis not present

## 2018-11-21 DIAGNOSIS — M6281 Muscle weakness (generalized): Secondary | ICD-10-CM | POA: Diagnosis not present

## 2018-11-21 DIAGNOSIS — Z9181 History of falling: Secondary | ICD-10-CM | POA: Diagnosis not present

## 2018-11-21 DIAGNOSIS — N390 Urinary tract infection, site not specified: Secondary | ICD-10-CM | POA: Diagnosis not present

## 2018-11-22 ENCOUNTER — Ambulatory Visit: Payer: Self-pay | Admitting: Podiatry

## 2018-11-23 DIAGNOSIS — N390 Urinary tract infection, site not specified: Secondary | ICD-10-CM | POA: Diagnosis not present

## 2018-11-23 DIAGNOSIS — Z9181 History of falling: Secondary | ICD-10-CM | POA: Diagnosis not present

## 2018-11-23 DIAGNOSIS — M6281 Muscle weakness (generalized): Secondary | ICD-10-CM | POA: Diagnosis not present

## 2018-11-23 DIAGNOSIS — R2681 Unsteadiness on feet: Secondary | ICD-10-CM | POA: Diagnosis not present

## 2018-11-23 DIAGNOSIS — M810 Age-related osteoporosis without current pathological fracture: Secondary | ICD-10-CM | POA: Diagnosis not present

## 2018-11-24 ENCOUNTER — Other Ambulatory Visit: Payer: Self-pay | Admitting: Family Medicine

## 2018-11-27 DIAGNOSIS — N390 Urinary tract infection, site not specified: Secondary | ICD-10-CM | POA: Diagnosis not present

## 2018-11-27 DIAGNOSIS — R2681 Unsteadiness on feet: Secondary | ICD-10-CM | POA: Diagnosis not present

## 2018-11-27 DIAGNOSIS — M810 Age-related osteoporosis without current pathological fracture: Secondary | ICD-10-CM | POA: Diagnosis not present

## 2018-11-27 DIAGNOSIS — Z9181 History of falling: Secondary | ICD-10-CM | POA: Diagnosis not present

## 2018-11-27 DIAGNOSIS — M6281 Muscle weakness (generalized): Secondary | ICD-10-CM | POA: Diagnosis not present

## 2018-11-29 DIAGNOSIS — M6281 Muscle weakness (generalized): Secondary | ICD-10-CM | POA: Diagnosis not present

## 2018-11-29 DIAGNOSIS — M810 Age-related osteoporosis without current pathological fracture: Secondary | ICD-10-CM | POA: Diagnosis not present

## 2018-11-29 DIAGNOSIS — R2681 Unsteadiness on feet: Secondary | ICD-10-CM | POA: Diagnosis not present

## 2018-11-29 DIAGNOSIS — Z9181 History of falling: Secondary | ICD-10-CM | POA: Diagnosis not present

## 2018-11-29 DIAGNOSIS — N390 Urinary tract infection, site not specified: Secondary | ICD-10-CM | POA: Diagnosis not present

## 2018-11-30 DIAGNOSIS — R2681 Unsteadiness on feet: Secondary | ICD-10-CM | POA: Diagnosis not present

## 2018-11-30 DIAGNOSIS — N390 Urinary tract infection, site not specified: Secondary | ICD-10-CM | POA: Diagnosis not present

## 2018-11-30 DIAGNOSIS — M6281 Muscle weakness (generalized): Secondary | ICD-10-CM | POA: Diagnosis not present

## 2018-11-30 DIAGNOSIS — M810 Age-related osteoporosis without current pathological fracture: Secondary | ICD-10-CM | POA: Diagnosis not present

## 2018-11-30 DIAGNOSIS — Z9181 History of falling: Secondary | ICD-10-CM | POA: Diagnosis not present

## 2018-12-03 DIAGNOSIS — Z9181 History of falling: Secondary | ICD-10-CM | POA: Diagnosis not present

## 2018-12-03 DIAGNOSIS — M6281 Muscle weakness (generalized): Secondary | ICD-10-CM | POA: Diagnosis not present

## 2018-12-03 DIAGNOSIS — M810 Age-related osteoporosis without current pathological fracture: Secondary | ICD-10-CM | POA: Diagnosis not present

## 2018-12-03 DIAGNOSIS — R2681 Unsteadiness on feet: Secondary | ICD-10-CM | POA: Diagnosis not present

## 2018-12-03 DIAGNOSIS — N390 Urinary tract infection, site not specified: Secondary | ICD-10-CM | POA: Diagnosis not present

## 2018-12-04 DIAGNOSIS — M810 Age-related osteoporosis without current pathological fracture: Secondary | ICD-10-CM | POA: Diagnosis not present

## 2018-12-04 DIAGNOSIS — R2681 Unsteadiness on feet: Secondary | ICD-10-CM | POA: Diagnosis not present

## 2018-12-04 DIAGNOSIS — M6281 Muscle weakness (generalized): Secondary | ICD-10-CM | POA: Diagnosis not present

## 2018-12-04 DIAGNOSIS — Z9181 History of falling: Secondary | ICD-10-CM | POA: Diagnosis not present

## 2018-12-04 DIAGNOSIS — N390 Urinary tract infection, site not specified: Secondary | ICD-10-CM | POA: Diagnosis not present

## 2018-12-06 DIAGNOSIS — Z9181 History of falling: Secondary | ICD-10-CM | POA: Diagnosis not present

## 2018-12-06 DIAGNOSIS — M810 Age-related osteoporosis without current pathological fracture: Secondary | ICD-10-CM | POA: Diagnosis not present

## 2018-12-06 DIAGNOSIS — M6281 Muscle weakness (generalized): Secondary | ICD-10-CM | POA: Diagnosis not present

## 2018-12-06 DIAGNOSIS — N390 Urinary tract infection, site not specified: Secondary | ICD-10-CM | POA: Diagnosis not present

## 2018-12-06 DIAGNOSIS — R2681 Unsteadiness on feet: Secondary | ICD-10-CM | POA: Diagnosis not present

## 2018-12-07 DIAGNOSIS — M810 Age-related osteoporosis without current pathological fracture: Secondary | ICD-10-CM | POA: Diagnosis not present

## 2018-12-07 DIAGNOSIS — Z9181 History of falling: Secondary | ICD-10-CM | POA: Diagnosis not present

## 2018-12-07 DIAGNOSIS — R2681 Unsteadiness on feet: Secondary | ICD-10-CM | POA: Diagnosis not present

## 2018-12-07 DIAGNOSIS — M6281 Muscle weakness (generalized): Secondary | ICD-10-CM | POA: Diagnosis not present

## 2018-12-07 DIAGNOSIS — N390 Urinary tract infection, site not specified: Secondary | ICD-10-CM | POA: Diagnosis not present

## 2018-12-11 DIAGNOSIS — M6281 Muscle weakness (generalized): Secondary | ICD-10-CM | POA: Diagnosis not present

## 2018-12-11 DIAGNOSIS — M810 Age-related osteoporosis without current pathological fracture: Secondary | ICD-10-CM | POA: Diagnosis not present

## 2018-12-11 DIAGNOSIS — R2681 Unsteadiness on feet: Secondary | ICD-10-CM | POA: Diagnosis not present

## 2018-12-11 DIAGNOSIS — N390 Urinary tract infection, site not specified: Secondary | ICD-10-CM | POA: Diagnosis not present

## 2018-12-11 DIAGNOSIS — Z9181 History of falling: Secondary | ICD-10-CM | POA: Diagnosis not present

## 2018-12-14 DIAGNOSIS — M810 Age-related osteoporosis without current pathological fracture: Secondary | ICD-10-CM | POA: Diagnosis not present

## 2018-12-14 DIAGNOSIS — M6281 Muscle weakness (generalized): Secondary | ICD-10-CM | POA: Diagnosis not present

## 2018-12-14 DIAGNOSIS — Z9181 History of falling: Secondary | ICD-10-CM | POA: Diagnosis not present

## 2018-12-14 DIAGNOSIS — N390 Urinary tract infection, site not specified: Secondary | ICD-10-CM | POA: Diagnosis not present

## 2018-12-18 DIAGNOSIS — M6281 Muscle weakness (generalized): Secondary | ICD-10-CM | POA: Diagnosis not present

## 2018-12-18 DIAGNOSIS — N390 Urinary tract infection, site not specified: Secondary | ICD-10-CM | POA: Diagnosis not present

## 2018-12-18 DIAGNOSIS — Z9181 History of falling: Secondary | ICD-10-CM | POA: Diagnosis not present

## 2018-12-18 DIAGNOSIS — M810 Age-related osteoporosis without current pathological fracture: Secondary | ICD-10-CM | POA: Diagnosis not present

## 2018-12-20 DIAGNOSIS — M6281 Muscle weakness (generalized): Secondary | ICD-10-CM | POA: Diagnosis not present

## 2018-12-20 DIAGNOSIS — Z9181 History of falling: Secondary | ICD-10-CM | POA: Diagnosis not present

## 2018-12-20 DIAGNOSIS — N390 Urinary tract infection, site not specified: Secondary | ICD-10-CM | POA: Diagnosis not present

## 2018-12-20 DIAGNOSIS — M810 Age-related osteoporosis without current pathological fracture: Secondary | ICD-10-CM | POA: Diagnosis not present

## 2018-12-24 ENCOUNTER — Other Ambulatory Visit: Payer: Self-pay | Admitting: Family Medicine

## 2018-12-26 DIAGNOSIS — Z9181 History of falling: Secondary | ICD-10-CM | POA: Diagnosis not present

## 2018-12-26 DIAGNOSIS — M6281 Muscle weakness (generalized): Secondary | ICD-10-CM | POA: Diagnosis not present

## 2018-12-26 DIAGNOSIS — M810 Age-related osteoporosis without current pathological fracture: Secondary | ICD-10-CM | POA: Diagnosis not present

## 2018-12-26 DIAGNOSIS — N390 Urinary tract infection, site not specified: Secondary | ICD-10-CM | POA: Diagnosis not present

## 2018-12-28 DIAGNOSIS — M810 Age-related osteoporosis without current pathological fracture: Secondary | ICD-10-CM | POA: Diagnosis not present

## 2018-12-28 DIAGNOSIS — Z9181 History of falling: Secondary | ICD-10-CM | POA: Diagnosis not present

## 2018-12-28 DIAGNOSIS — N390 Urinary tract infection, site not specified: Secondary | ICD-10-CM | POA: Diagnosis not present

## 2018-12-28 DIAGNOSIS — M6281 Muscle weakness (generalized): Secondary | ICD-10-CM | POA: Diagnosis not present

## 2018-12-31 ENCOUNTER — Other Ambulatory Visit: Payer: Self-pay | Admitting: Family Medicine

## 2019-01-01 DIAGNOSIS — Z9181 History of falling: Secondary | ICD-10-CM | POA: Diagnosis not present

## 2019-01-01 DIAGNOSIS — N390 Urinary tract infection, site not specified: Secondary | ICD-10-CM | POA: Diagnosis not present

## 2019-01-01 DIAGNOSIS — M810 Age-related osteoporosis without current pathological fracture: Secondary | ICD-10-CM | POA: Diagnosis not present

## 2019-01-01 DIAGNOSIS — M6281 Muscle weakness (generalized): Secondary | ICD-10-CM | POA: Diagnosis not present

## 2019-01-03 DIAGNOSIS — M6281 Muscle weakness (generalized): Secondary | ICD-10-CM | POA: Diagnosis not present

## 2019-01-03 DIAGNOSIS — N390 Urinary tract infection, site not specified: Secondary | ICD-10-CM | POA: Diagnosis not present

## 2019-01-03 DIAGNOSIS — M810 Age-related osteoporosis without current pathological fracture: Secondary | ICD-10-CM | POA: Diagnosis not present

## 2019-01-03 DIAGNOSIS — Z9181 History of falling: Secondary | ICD-10-CM | POA: Diagnosis not present

## 2019-01-08 DIAGNOSIS — M810 Age-related osteoporosis without current pathological fracture: Secondary | ICD-10-CM | POA: Diagnosis not present

## 2019-01-08 DIAGNOSIS — Z9181 History of falling: Secondary | ICD-10-CM | POA: Diagnosis not present

## 2019-01-08 DIAGNOSIS — N390 Urinary tract infection, site not specified: Secondary | ICD-10-CM | POA: Diagnosis not present

## 2019-01-08 DIAGNOSIS — M6281 Muscle weakness (generalized): Secondary | ICD-10-CM | POA: Diagnosis not present

## 2019-01-10 ENCOUNTER — Other Ambulatory Visit: Payer: Self-pay | Admitting: Family Medicine

## 2019-01-11 DIAGNOSIS — M810 Age-related osteoporosis without current pathological fracture: Secondary | ICD-10-CM | POA: Diagnosis not present

## 2019-01-11 DIAGNOSIS — N390 Urinary tract infection, site not specified: Secondary | ICD-10-CM | POA: Diagnosis not present

## 2019-01-11 DIAGNOSIS — Z9181 History of falling: Secondary | ICD-10-CM | POA: Diagnosis not present

## 2019-01-11 DIAGNOSIS — M6281 Muscle weakness (generalized): Secondary | ICD-10-CM | POA: Diagnosis not present

## 2019-01-21 DIAGNOSIS — I10 Essential (primary) hypertension: Secondary | ICD-10-CM | POA: Diagnosis not present

## 2019-01-21 DIAGNOSIS — M810 Age-related osteoporosis without current pathological fracture: Secondary | ICD-10-CM | POA: Diagnosis not present

## 2019-01-21 DIAGNOSIS — Z66 Do not resuscitate: Secondary | ICD-10-CM | POA: Diagnosis not present

## 2019-01-21 DIAGNOSIS — J3089 Other allergic rhinitis: Secondary | ICD-10-CM | POA: Diagnosis not present

## 2019-01-21 DIAGNOSIS — J00 Acute nasopharyngitis [common cold]: Secondary | ICD-10-CM | POA: Diagnosis not present

## 2019-01-24 DIAGNOSIS — Z79899 Other long term (current) drug therapy: Secondary | ICD-10-CM | POA: Diagnosis not present

## 2019-01-28 DIAGNOSIS — E538 Deficiency of other specified B group vitamins: Secondary | ICD-10-CM | POA: Diagnosis not present

## 2019-01-28 DIAGNOSIS — E039 Hypothyroidism, unspecified: Secondary | ICD-10-CM | POA: Diagnosis not present

## 2019-01-28 DIAGNOSIS — F5102 Adjustment insomnia: Secondary | ICD-10-CM | POA: Diagnosis not present

## 2019-01-28 DIAGNOSIS — D509 Iron deficiency anemia, unspecified: Secondary | ICD-10-CM | POA: Diagnosis not present

## 2019-01-28 DIAGNOSIS — E119 Type 2 diabetes mellitus without complications: Secondary | ICD-10-CM | POA: Diagnosis not present

## 2019-01-28 DIAGNOSIS — K5904 Chronic idiopathic constipation: Secondary | ICD-10-CM | POA: Diagnosis not present

## 2019-01-28 DIAGNOSIS — E559 Vitamin D deficiency, unspecified: Secondary | ICD-10-CM | POA: Diagnosis not present

## 2019-01-28 DIAGNOSIS — M810 Age-related osteoporosis without current pathological fracture: Secondary | ICD-10-CM | POA: Diagnosis not present

## 2019-02-07 DIAGNOSIS — D1800 Hemangioma unspecified site: Secondary | ICD-10-CM | POA: Diagnosis not present

## 2019-02-07 DIAGNOSIS — Z85828 Personal history of other malignant neoplasm of skin: Secondary | ICD-10-CM | POA: Diagnosis not present

## 2019-02-07 DIAGNOSIS — L820 Inflamed seborrheic keratosis: Secondary | ICD-10-CM | POA: Diagnosis not present

## 2019-02-11 DIAGNOSIS — R634 Abnormal weight loss: Secondary | ICD-10-CM | POA: Diagnosis not present

## 2019-02-11 DIAGNOSIS — F5102 Adjustment insomnia: Secondary | ICD-10-CM | POA: Diagnosis not present

## 2019-02-11 DIAGNOSIS — E039 Hypothyroidism, unspecified: Secondary | ICD-10-CM | POA: Diagnosis not present

## 2019-02-11 DIAGNOSIS — K5904 Chronic idiopathic constipation: Secondary | ICD-10-CM | POA: Diagnosis not present

## 2019-03-11 ENCOUNTER — Other Ambulatory Visit: Payer: Self-pay | Admitting: Family Medicine

## 2019-03-14 DIAGNOSIS — E039 Hypothyroidism, unspecified: Secondary | ICD-10-CM | POA: Diagnosis not present

## 2019-03-14 DIAGNOSIS — Z79899 Other long term (current) drug therapy: Secondary | ICD-10-CM | POA: Diagnosis not present

## 2019-04-15 DIAGNOSIS — E039 Hypothyroidism, unspecified: Secondary | ICD-10-CM | POA: Diagnosis not present

## 2019-04-15 DIAGNOSIS — G4700 Insomnia, unspecified: Secondary | ICD-10-CM | POA: Diagnosis not present

## 2019-04-15 DIAGNOSIS — R634 Abnormal weight loss: Secondary | ICD-10-CM | POA: Diagnosis not present

## 2019-04-15 DIAGNOSIS — K5904 Chronic idiopathic constipation: Secondary | ICD-10-CM | POA: Diagnosis not present

## 2019-04-29 DIAGNOSIS — Z139 Encounter for screening, unspecified: Secondary | ICD-10-CM | POA: Diagnosis not present

## 2019-05-29 DIAGNOSIS — E119 Type 2 diabetes mellitus without complications: Secondary | ICD-10-CM | POA: Diagnosis not present

## 2019-05-29 DIAGNOSIS — Z79899 Other long term (current) drug therapy: Secondary | ICD-10-CM | POA: Diagnosis not present

## 2019-05-29 DIAGNOSIS — I10 Essential (primary) hypertension: Secondary | ICD-10-CM | POA: Diagnosis not present

## 2019-06-03 DIAGNOSIS — I10 Essential (primary) hypertension: Secondary | ICD-10-CM | POA: Diagnosis not present

## 2019-06-03 DIAGNOSIS — D509 Iron deficiency anemia, unspecified: Secondary | ICD-10-CM | POA: Diagnosis not present

## 2019-06-03 DIAGNOSIS — G4700 Insomnia, unspecified: Secondary | ICD-10-CM | POA: Diagnosis not present

## 2019-06-03 DIAGNOSIS — Z8719 Personal history of other diseases of the digestive system: Secondary | ICD-10-CM | POA: Diagnosis not present

## 2019-06-03 DIAGNOSIS — K5904 Chronic idiopathic constipation: Secondary | ICD-10-CM | POA: Diagnosis not present

## 2019-06-17 DIAGNOSIS — K5904 Chronic idiopathic constipation: Secondary | ICD-10-CM | POA: Diagnosis not present

## 2019-06-17 DIAGNOSIS — D509 Iron deficiency anemia, unspecified: Secondary | ICD-10-CM | POA: Diagnosis not present

## 2019-06-17 DIAGNOSIS — I10 Essential (primary) hypertension: Secondary | ICD-10-CM | POA: Diagnosis not present

## 2019-06-17 DIAGNOSIS — Z8719 Personal history of other diseases of the digestive system: Secondary | ICD-10-CM | POA: Diagnosis not present

## 2019-07-11 ENCOUNTER — Other Ambulatory Visit: Payer: Self-pay

## 2019-08-15 DIAGNOSIS — Z20828 Contact with and (suspected) exposure to other viral communicable diseases: Secondary | ICD-10-CM | POA: Diagnosis not present

## 2019-08-26 ENCOUNTER — Other Ambulatory Visit: Payer: Self-pay | Admitting: Family Medicine

## 2019-08-26 DIAGNOSIS — K5904 Chronic idiopathic constipation: Secondary | ICD-10-CM | POA: Diagnosis not present

## 2019-08-26 DIAGNOSIS — B372 Candidiasis of skin and nail: Secondary | ICD-10-CM | POA: Diagnosis not present

## 2019-08-26 DIAGNOSIS — G4700 Insomnia, unspecified: Secondary | ICD-10-CM | POA: Diagnosis not present

## 2019-08-27 ENCOUNTER — Encounter: Payer: Self-pay | Admitting: Emergency Medicine

## 2019-08-27 ENCOUNTER — Emergency Department
Admission: EM | Admit: 2019-08-27 | Discharge: 2019-08-27 | Disposition: A | Discharge status: Other Acute Inpatient Hospital | Payer: Medicare Other | Attending: Emergency Medicine | Admitting: Emergency Medicine

## 2019-08-27 ENCOUNTER — Emergency Department: Payer: Medicare Other

## 2019-08-27 ENCOUNTER — Other Ambulatory Visit: Payer: Self-pay

## 2019-08-27 DIAGNOSIS — S0990XA Unspecified injury of head, initial encounter: Secondary | ICD-10-CM | POA: Diagnosis not present

## 2019-08-27 DIAGNOSIS — R402412 Glasgow coma scale score 13-15, at arrival to emergency department: Secondary | ICD-10-CM | POA: Diagnosis not present

## 2019-08-27 DIAGNOSIS — Y9389 Activity, other specified: Secondary | ICD-10-CM | POA: Diagnosis not present

## 2019-08-27 DIAGNOSIS — S50812A Abrasion of left forearm, initial encounter: Secondary | ICD-10-CM | POA: Diagnosis not present

## 2019-08-27 DIAGNOSIS — R51 Headache: Secondary | ICD-10-CM | POA: Diagnosis not present

## 2019-08-27 DIAGNOSIS — Z79899 Other long term (current) drug therapy: Secondary | ICD-10-CM | POA: Insufficient documentation

## 2019-08-27 DIAGNOSIS — R52 Pain, unspecified: Secondary | ICD-10-CM | POA: Diagnosis not present

## 2019-08-27 DIAGNOSIS — R531 Weakness: Secondary | ICD-10-CM | POA: Diagnosis not present

## 2019-08-27 DIAGNOSIS — S06360A Traumatic hemorrhage of cerebrum, unspecified, without loss of consciousness, initial encounter: Secondary | ICD-10-CM | POA: Diagnosis not present

## 2019-08-27 DIAGNOSIS — W19XXXA Unspecified fall, initial encounter: Secondary | ICD-10-CM

## 2019-08-27 DIAGNOSIS — M47816 Spondylosis without myelopathy or radiculopathy, lumbar region: Secondary | ICD-10-CM | POA: Diagnosis not present

## 2019-08-27 DIAGNOSIS — Z993 Dependence on wheelchair: Secondary | ICD-10-CM | POA: Diagnosis not present

## 2019-08-27 DIAGNOSIS — M79602 Pain in left arm: Secondary | ICD-10-CM | POA: Diagnosis not present

## 2019-08-27 DIAGNOSIS — S7292XD Unspecified fracture of left femur, subsequent encounter for closed fracture with routine healing: Secondary | ICD-10-CM | POA: Diagnosis not present

## 2019-08-27 DIAGNOSIS — Y92129 Unspecified place in nursing home as the place of occurrence of the external cause: Secondary | ICD-10-CM | POA: Insufficient documentation

## 2019-08-27 DIAGNOSIS — Z8679 Personal history of other diseases of the circulatory system: Secondary | ICD-10-CM | POA: Diagnosis not present

## 2019-08-27 DIAGNOSIS — S8991XA Unspecified injury of right lower leg, initial encounter: Secondary | ICD-10-CM | POA: Diagnosis not present

## 2019-08-27 DIAGNOSIS — Z20828 Contact with and (suspected) exposure to other viral communicable diseases: Secondary | ICD-10-CM | POA: Diagnosis not present

## 2019-08-27 DIAGNOSIS — E039 Hypothyroidism, unspecified: Secondary | ICD-10-CM | POA: Diagnosis not present

## 2019-08-27 DIAGNOSIS — I629 Nontraumatic intracranial hemorrhage, unspecified: Secondary | ICD-10-CM

## 2019-08-27 DIAGNOSIS — Z85828 Personal history of other malignant neoplasm of skin: Secondary | ICD-10-CM | POA: Diagnosis not present

## 2019-08-27 DIAGNOSIS — Y999 Unspecified external cause status: Secondary | ICD-10-CM | POA: Insufficient documentation

## 2019-08-27 DIAGNOSIS — S3991XA Unspecified injury of abdomen, initial encounter: Secondary | ICD-10-CM | POA: Diagnosis not present

## 2019-08-27 DIAGNOSIS — W050XXA Fall from non-moving wheelchair, initial encounter: Secondary | ICD-10-CM | POA: Diagnosis not present

## 2019-08-27 DIAGNOSIS — M8588 Other specified disorders of bone density and structure, other site: Secondary | ICD-10-CM | POA: Diagnosis not present

## 2019-08-27 DIAGNOSIS — M25561 Pain in right knee: Secondary | ICD-10-CM | POA: Diagnosis not present

## 2019-08-27 DIAGNOSIS — Z03818 Encounter for observation for suspected exposure to other biological agents ruled out: Secondary | ICD-10-CM | POA: Diagnosis not present

## 2019-08-27 DIAGNOSIS — M25519 Pain in unspecified shoulder: Secondary | ICD-10-CM | POA: Diagnosis not present

## 2019-08-27 DIAGNOSIS — M25512 Pain in left shoulder: Secondary | ICD-10-CM | POA: Insufficient documentation

## 2019-08-27 DIAGNOSIS — S4992XA Unspecified injury of left shoulder and upper arm, initial encounter: Secondary | ICD-10-CM | POA: Diagnosis not present

## 2019-08-27 DIAGNOSIS — R918 Other nonspecific abnormal finding of lung field: Secondary | ICD-10-CM | POA: Diagnosis not present

## 2019-08-27 DIAGNOSIS — S59902A Unspecified injury of left elbow, initial encounter: Secondary | ICD-10-CM | POA: Diagnosis not present

## 2019-08-27 DIAGNOSIS — S0001XA Abrasion of scalp, initial encounter: Secondary | ICD-10-CM | POA: Diagnosis present

## 2019-08-27 DIAGNOSIS — S065X0A Traumatic subdural hemorrhage without loss of consciousness, initial encounter: Secondary | ICD-10-CM | POA: Diagnosis not present

## 2019-08-27 DIAGNOSIS — S199XXA Unspecified injury of neck, initial encounter: Secondary | ICD-10-CM | POA: Diagnosis not present

## 2019-08-27 DIAGNOSIS — R935 Abnormal findings on diagnostic imaging of other abdominal regions, including retroperitoneum: Secondary | ICD-10-CM | POA: Diagnosis not present

## 2019-08-27 DIAGNOSIS — S6992XA Unspecified injury of left wrist, hand and finger(s), initial encounter: Secondary | ICD-10-CM | POA: Diagnosis not present

## 2019-08-27 DIAGNOSIS — S0081XA Abrasion of other part of head, initial encounter: Secondary | ICD-10-CM | POA: Diagnosis not present

## 2019-08-27 DIAGNOSIS — I10 Essential (primary) hypertension: Secondary | ICD-10-CM | POA: Diagnosis not present

## 2019-08-27 DIAGNOSIS — Z7401 Bed confinement status: Secondary | ICD-10-CM | POA: Diagnosis not present

## 2019-08-27 DIAGNOSIS — W19XXXD Unspecified fall, subsequent encounter: Secondary | ICD-10-CM | POA: Diagnosis not present

## 2019-08-27 DIAGNOSIS — M8580 Other specified disorders of bone density and structure, unspecified site: Secondary | ICD-10-CM | POA: Diagnosis not present

## 2019-08-27 DIAGNOSIS — Z7982 Long term (current) use of aspirin: Secondary | ICD-10-CM | POA: Diagnosis not present

## 2019-08-27 DIAGNOSIS — K7689 Other specified diseases of liver: Secondary | ICD-10-CM | POA: Diagnosis not present

## 2019-08-27 DIAGNOSIS — S59912A Unspecified injury of left forearm, initial encounter: Secondary | ICD-10-CM | POA: Diagnosis not present

## 2019-08-27 LAB — CBC WITH DIFFERENTIAL/PLATELET
Abs Immature Granulocytes: 0.05 10*3/uL (ref 0.00–0.07)
Basophils Absolute: 0.1 10*3/uL (ref 0.0–0.1)
Basophils Relative: 1 %
Eosinophils Absolute: 0.2 10*3/uL (ref 0.0–0.5)
Eosinophils Relative: 2 %
HCT: 33.5 % — ABNORMAL LOW (ref 36.0–46.0)
Hemoglobin: 10 g/dL — ABNORMAL LOW (ref 12.0–15.0)
Immature Granulocytes: 1 %
Lymphocytes Relative: 23 %
Lymphs Abs: 2.2 10*3/uL (ref 0.7–4.0)
MCH: 23.3 pg — ABNORMAL LOW (ref 26.0–34.0)
MCHC: 29.9 g/dL — ABNORMAL LOW (ref 30.0–36.0)
MCV: 78.1 fL — ABNORMAL LOW (ref 80.0–100.0)
Monocytes Absolute: 0.6 10*3/uL (ref 0.1–1.0)
Monocytes Relative: 7 %
Neutro Abs: 6.5 10*3/uL (ref 1.7–7.7)
Neutrophils Relative %: 66 %
Platelets: 288 10*3/uL (ref 150–400)
RBC: 4.29 MIL/uL (ref 3.87–5.11)
RDW: 17.8 % — ABNORMAL HIGH (ref 11.5–15.5)
WBC: 9.5 10*3/uL (ref 4.0–10.5)
nRBC: 0 % (ref 0.0–0.2)

## 2019-08-27 LAB — BASIC METABOLIC PANEL
Anion gap: 10 (ref 5–15)
BUN: 13 mg/dL (ref 8–23)
CO2: 25 mmol/L (ref 22–32)
Calcium: 9.1 mg/dL (ref 8.9–10.3)
Chloride: 103 mmol/L (ref 98–111)
Creatinine, Ser: 0.71 mg/dL (ref 0.44–1.00)
GFR calc Af Amer: >60 mL/min (ref >60)
GFR calc non Af Amer: >60 mL/min (ref >60)
Glucose, Bld: 115 mg/dL — ABNORMAL HIGH (ref 70–99)
Potassium: 4.1 mmol/L (ref 3.5–5.1)
Sodium: 138 mmol/L (ref 135–145)

## 2019-08-27 LAB — SARS CORONAVIRUS 2 BY RT PCR (HOSPITAL ORDER, PERFORMED IN ~~LOC~~ HOSPITAL LAB): SARS Coronavirus 2: NEGATIVE

## 2019-08-27 MED ORDER — LEVETIRACETAM IN NACL 500 MG/100ML IV SOLN
500.0000 mg | Freq: Once | INTRAVENOUS | Status: AC
  Administered 2019-08-27: 500 mg via INTRAVENOUS
  Filled 2019-08-27: qty 100

## 2019-08-27 MED ORDER — NICARDIPINE HCL IN NACL 20-0.86 MG/200ML-% IV SOLN
3.0000 mg/h | INTRAVENOUS | Status: DC
  Administered 2019-08-27: 5 mg/h via INTRAVENOUS
  Filled 2019-08-27: qty 200

## 2019-08-27 NOTE — ED Notes (Signed)
[facility]  CALLED  PER  DR  JESSUP  MD 

## 2019-08-27 NOTE — ED Notes (Signed)
Patient transported to CT 

## 2019-08-27 NOTE — ED Triage Notes (Signed)
Pt in via [facility] from [facility], reports leaning over while sitting in wheel chair to pick something up, falling out of wheel chair.  Reports hitting head on floor, denies LOC.  Lacerations to forehead and left forearm.  Complaints of headache and left arm pain.   A/Ox4, NAD noted at this time.

## 2019-08-27 NOTE — ED Notes (Signed)
EMTALA reviewed. 

## 2019-08-27 NOTE — ED Provider Notes (Signed)
[facility] Emergency Department Provider Note   ____________________________________________   First MD Initiated Contact with Patient 08/27/19 [phone]     (approximate)  I have reviewed the triage vital signs and the nursing notes.   HISTORY  Chief Complaint Fall    HPI [name] is a 85 y.o. female with past medical history of hypertension who presents to the ED following fall.  Patient reports that she dropped something while sitting in her wheelchair, and when she leaned over to attempt to pick it up she fell forward and out of her wheelchair, hitting her head.  She did not lose consciousness, states she did hit her left shoulder and right knee in addition to her head.  She now primarily complains of headache as well as left shoulder pain and right knee pain.  She is non ambulatory at baseline, denies any new numbness or weakness.        Past Medical History:  Diagnosis Date  . Cancer ([place]) 08/27/2018   left outer arm some type of skin cancer  . Eye infection   . Falls   . Pelvic fracture [facility])     Patient Active Problem List   Diagnosis Date Noted  . Age-related osteoporosis with current pathological fracture of left femur with routine healing 08/21/2016  . Pelvic fracture, closed, initial encounter 07/29/2016  . Edema leg 06/17/2016  . Acid reflux 06/17/2016  . Bleeding gastrointestinal 06/17/2016  . BP (high blood pressure) 06/17/2016  . Adult hypothyroidism 06/17/2016  . Colitis, ischemic ([place]) 06/17/2016  . Osteoarthrosis 06/17/2016  . OP (osteoporosis) 06/17/2016  . Gastroduodenal ulcer 06/17/2016  . Chronic follicular conjunctivitis 03/02/2016  . History of surgical procedure 03/02/2016  . 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 Personal history of healed traumatic fracture 12/21/2015  . Corneal perforation of left eye 12/18/2015  . Hyponatremia 12/16/2015  . Rib fracture 12/14/2015    Past Surgical History:  Procedure  Laterality Date  . ABDOMINAL HYSTERECTOMY    . CESAREAN SECTION    . skin cancer removed  08/27/2018   left outer upper arm    Prior to Admission medications   Medication Sig Start Date End Date Taking? Authorizing Provider  acetaminophen (TYLENOL) 325 MG tablet Take 650 mg by mouth every 6 (six) hours as needed.   Yes [provider]  acetaminophen (TYLENOL) 650 MG CR tablet Take 650 mg by mouth every 4 (four) hours as needed for pain.   Yes [provider]  Cholecalciferol (VITAMIN D) 50 MCG (2000 UT) CAPS Take 2,000 Units by mouth daily.   Yes [provider]  Cyanocobalamin (VITAMIN B-12 CR) 1000 MCG TBCR Take 1,000 mcg by mouth daily.   Yes [provider]  dextromethorphan (DELSYM COUGH CHILDRENS) 30 MG/5ML liquid Take 5 mLs (30 mg total) by mouth as needed for cough. Twice a day for 1 week. Patient taking differently: Take 30 mg by mouth as needed for cough.  04/16/18  Yes Chrismon, Vickki Muff, PA  diphenhydramine-acetaminophen (TYLENOL PM) 25-500 MG TABS tablet Take 1 tablet by mouth at bedtime as needed.   Yes [provider]  ferrous sulfate 325 (65 FE) MG tablet Take 325 mg by mouth daily with breakfast.   Yes [provider]  guaiFENesin-dextromethorphan (ROBITUSSIN DM) 100-10 MG/5ML syrup Take 5 mLs by mouth every 4 (four) hours as needed for cough. Patient taking differently: Take 5 mLs by mouth every 6 (six) hours as needed for cough.  04/11/18  Yes  Jerrol Banana., MD  levobunolol (BETAGAN) 0.5 % ophthalmic solution PLACE 1 DROP INTO RIGHT EYE 2 TIMES DAILY 08/26/19  Yes Jerrol Banana., MD  levothyroxine (SYNTHROID, LEVOTHROID) 50 MCG tablet TAKE 1 TABLET BY MOUTH ONCE DAILY FOR HYPOTHYROIDISM.  ** GETS MEDS FROM MAILORDER ** 01/11/19  Yes Jerrol Banana., MD  loperamide (IMODIUM) 2 MG capsule Take 2 mg by mouth every 8 (eight) hours as needed for diarrhea or loose stools.   Yes [provider]   loratadine (CLARITIN) 10 MG tablet Take 1 tablet (10 mg total) by mouth daily. For allergic rhinitis when it flares - may use as needed once a day. 04/16/18  Yes Chrismon, Vickki Muff, PA  Melatonin 3 MG TABS TAKE 1 TABLET (3 MG) BY MOUTH ONCE DAILY AT BEDTIME FOR SLEEP Patient taking differently: Take 6 mg by mouth at bedtime.  10/03/18  Yes Jerrol Banana., MD  metoprolol tartrate (LOPRESSOR) 50 MG tablet Take 50 mg by mouth 2 (two) times daily.   Yes [provider]  moxifloxacin (VIGAMOX) 0.5 % ophthalmic solution INSTILL 1 DROP INTO BOTH EYES TWICE A DAY 03/11/19  Yes Jerrol Banana., MD  pantoprazole (PROTONIX) 40 MG tablet TAKE 1 TABLET BY MOUTH ONCE DAILY FOR GERD 10/15/18  Yes Jerrol Banana., MD  phenazopyridine (PYRIDIUM) 200 MG tablet Take 1 tablet (200 mg total) by mouth 3 (three) times daily as needed for pain. 09/18/18  Yes Chrismon, Vickki Muff, PA  polyethylene glycol (MIRALAX / GLYCOLAX) 17 g packet Take 8.5 g by mouth 2 (two) times a week. Monday and Thursdays   Yes [provider]  ramipril (ALTACE) 5 MG capsule TAKE 1 CAPSULE BY MOUTH ONCE DAILY FOR BLOOD PRESSURE 10/27/17  Yes Jerrol Banana., MD  traZODone (DESYREL) 50 MG tablet Take 25 mg by mouth at bedtime.   Yes [provider]  white petrolatum ointment Apply topically as needed for dry skin. Apply to left and  Right lower legs   Yes [provider]    Allergies Ceftin [cefuroxime axetil], Risedronate, Sulfa antibiotics, and Teriparatide  Family History  Problem Relation Age of Onset  . Cancer Sister   . Cancer Sister     Social History Social History   Tobacco Use  . Smoking status: Never Smoker  . Smokeless tobacco: Never Used  Substance Use Topics  . Alcohol use: No  . Drug use: No    Review of Systems  Constitutional: No fever/chills Eyes: No visual changes. ENT: No sore throat. Cardiovascular: Denies chest pain. Respiratory: Denies shortness  of breath. Gastrointestinal: No abdominal pain.  No nausea, no vomiting.  No diarrhea.  No constipation. Genitourinary: Negative for dysuria. Musculoskeletal: Negative for back pain.  Positive for left shoulder and right knee pain. Skin: Negative for rash. Neurological: Positive for headache, negative for focal weakness or numbness.  ____________________________________________   PHYSICAL EXAM:  VITAL SIGNS: ED Triage Vitals  Enc Vitals Group     BP      Pulse      Resp      Temp      Temp src      SpO2      Weight      Height      Head Circumference      Peak Flow      Pain Score      Pain Loc      Pain Edu?  Excl. in [place]?     Constitutional: Alert and oriented. Eyes: Conjunctivae are normal.  Crusting drainage to left eye with glass eye in place. Head: Abrasion to frontal scalp, no lacerations.  No facial bony tenderness. Nose: No congestion/rhinnorhea. Mouth/Throat: Mucous membranes are moist. Neck: Normal ROM, no midline cervical spine tenderness. Cardiovascular: Normal rate, regular rhythm. Grossly normal heart sounds. Respiratory: Normal respiratory effort.  No retractions. Lungs CTAB. Gastrointestinal: Soft and nontender. No distention. Genitourinary: deferred Musculoskeletal: Diffuse tenderness to left shoulder and right knee, otherwise no extremity bony tenderness.  Pelvis stable without tenderness. Neurologic:  Normal speech and language. No gross focal neurologic deficits are appreciated. Skin:  Skin is warm, dry and intact. No rash noted.  Skin tear to left forearm. Psychiatric: Mood and affect are normal. Speech and behavior are normal.  ____________________________________________   LABS (all labs ordered are listed, but only abnormal results are displayed)  Labs Reviewed  CBC WITH DIFFERENTIAL/PLATELET - Abnormal; Notable for the following components:      Result Value   Hemoglobin 10.0 (*)    HCT 33.5 (*)    MCV 78.1 (*)    MCH 23.3 (*)     MCHC 29.9 (*)    RDW 17.8 (*)    All other components within normal limits  BASIC METABOLIC PANEL - Abnormal; Notable for the following components:   Glucose, Bld 115 (*)    All other components within normal limits  SARS CORONAVIRUS 2 (HOSPITAL ORDER, [place] [facility])     PROCEDURES  Procedure(s) performed (including Critical Care):  .Critical Care Performed by: Blake Divine, MD Authorized by: Blake Divine, MD   Critical care provider statement:    Critical care time (minutes):  45   Critical care time was exclusive of:  Separately billable procedures and treating other patients and teaching time   Critical care was necessary to treat or prevent imminent or life-threatening deterioration of the following conditions:  CNS failure or compromise   Critical care was time spent personally by me on the following activities:  Discussions with consultants, evaluation of patient's response to treatment, examination of patient, ordering and performing treatments and interventions, ordering and review of laboratory studies, ordering and review of radiographic studies, pulse oximetry, re-evaluation of patient's condition, obtaining history from patient or surrogate and review of old charts   I assumed direction of critical care for this patient from another provider in my specialty: no       ____________________________________________   INITIAL IMPRESSION / ASSESSMENT AND PLAN / ED COURSE       85 year old female with past medical history of hypertension presents to the ED following mechanical fall onto her head out of her wheelchair.  There was no LOC and she is not anticoagulated.  Will check a CT head and C-spine, in addition to x-rays of left shoulder and right knee.  No other apparent traumatic injury, patient has intact range of motion at hips bilaterally with stable pelvis.  She has a benign and nonfocal neurologic exam.  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  This is chronic per patient and she already receives regular drops in this eye.  CT head significant for large intracranial hemorrhage, likely epidural and subdural component.  Patient remains neurologically intact, alert and oriented x3.  Case discussed with Dr. Doyne Keel of [place] neurosurgery, recommends holding off on hypertonic saline unless patient with any neurologic changes.  Will give  500 mg Keppra for seizure prophylaxis and start Cardene with goal systolic BP of less than 0000000.  Patient transferred to [facility] without issue, remained neurologically intact at the time of transfer.      ____________________________________________   FINAL CLINICAL IMPRESSION(S) / ED DIAGNOSES  Final diagnoses:  Intracranial hemorrhage ([place])  Fall, initial encounter     ED Discharge Orders    None       Note:  This document was prepared using Dragon voice recognition software and may include unintentional dictation errors.   Blake Divine, MD 08/27/19 1601

## 2019-08-28 DIAGNOSIS — R410 Disorientation, unspecified: Secondary | ICD-10-CM | POA: Diagnosis not present

## 2019-08-28 DIAGNOSIS — S0093XD Contusion of unspecified part of head, subsequent encounter: Secondary | ICD-10-CM | POA: Diagnosis not present

## 2019-08-28 DIAGNOSIS — M6281 Muscle weakness (generalized): Secondary | ICD-10-CM | POA: Diagnosis not present

## 2019-08-28 DIAGNOSIS — Z9181 History of falling: Secondary | ICD-10-CM | POA: Diagnosis not present

## 2019-08-28 DIAGNOSIS — R269 Unspecified abnormalities of gait and mobility: Secondary | ICD-10-CM | POA: Diagnosis not present

## 2019-08-28 DIAGNOSIS — M79602 Pain in left arm: Secondary | ICD-10-CM | POA: Diagnosis not present

## 2019-08-28 DIAGNOSIS — S065X0A Traumatic subdural hemorrhage without loss of consciousness, initial encounter: Secondary | ICD-10-CM | POA: Diagnosis not present

## 2019-08-28 DIAGNOSIS — M25512 Pain in left shoulder: Secondary | ICD-10-CM | POA: Diagnosis not present

## 2019-08-29 ENCOUNTER — Telehealth: Payer: Self-pay

## 2019-08-29 NOTE — Telephone Encounter (Signed)
Don't see any PT order in chart.

## 2019-08-29 NOTE — Telephone Encounter (Signed)
Physical therapist Oneida Arenas from  [facility] called to check on whether Dr. Rosanna Randy received her order for patient to start PT? Patient fell a few days ago and hit her head. Patient was seen in the ER. Andee Poles works in the assisted living facility [employer], which is the patient's residence. Danielle reports that patient is not moving around like she normally does, she thinks patient could benefit from PT. Andee Poles says that she faxed an order for Dr. Caryn Section to sign and would like it back asap so that she can start PT. Danielle cannot accept a verbal order. I advised her that Dr. Rosanna Randy was out until Monday. Danielle asked if another provider in the office could sign the order today and fax it back to her. I requested that she re-fax the order for them to review.

## 2019-09-02 DIAGNOSIS — M79605 Pain in left leg: Secondary | ICD-10-CM | POA: Diagnosis not present

## 2019-09-02 DIAGNOSIS — Z136 Encounter for screening for cardiovascular disorders: Secondary | ICD-10-CM | POA: Diagnosis not present

## 2019-09-02 DIAGNOSIS — S0003XD Contusion of scalp, subsequent encounter: Secondary | ICD-10-CM | POA: Diagnosis not present

## 2019-09-02 DIAGNOSIS — M818 Other osteoporosis without current pathological fracture: Secondary | ICD-10-CM | POA: Diagnosis not present

## 2019-09-02 DIAGNOSIS — R609 Edema, unspecified: Secondary | ICD-10-CM | POA: Diagnosis not present

## 2019-09-02 DIAGNOSIS — R269 Unspecified abnormalities of gait and mobility: Secondary | ICD-10-CM | POA: Diagnosis not present

## 2019-09-02 DIAGNOSIS — S06300A Unspecified focal traumatic brain injury without loss of consciousness, initial encounter: Secondary | ICD-10-CM | POA: Diagnosis not present

## 2019-09-02 DIAGNOSIS — L89523 Pressure ulcer of left ankle, stage 3: Secondary | ICD-10-CM | POA: Diagnosis not present

## 2019-09-02 DIAGNOSIS — R2681 Unsteadiness on feet: Secondary | ICD-10-CM | POA: Diagnosis not present

## 2019-09-02 DIAGNOSIS — M6281 Muscle weakness (generalized): Secondary | ICD-10-CM | POA: Diagnosis not present

## 2019-09-02 DIAGNOSIS — R278 Other lack of coordination: Secondary | ICD-10-CM | POA: Diagnosis not present

## 2019-09-02 DIAGNOSIS — Z9181 History of falling: Secondary | ICD-10-CM | POA: Diagnosis not present

## 2019-09-02 DIAGNOSIS — R5381 Other malaise: Secondary | ICD-10-CM | POA: Diagnosis not present

## 2019-09-03 DIAGNOSIS — M6281 Muscle weakness (generalized): Secondary | ICD-10-CM | POA: Diagnosis not present

## 2019-09-03 DIAGNOSIS — R2681 Unsteadiness on feet: Secondary | ICD-10-CM | POA: Diagnosis not present

## 2019-09-03 DIAGNOSIS — S06300A Unspecified focal traumatic brain injury without loss of consciousness, initial encounter: Secondary | ICD-10-CM | POA: Diagnosis not present

## 2019-09-03 DIAGNOSIS — R278 Other lack of coordination: Secondary | ICD-10-CM | POA: Diagnosis not present

## 2019-09-03 DIAGNOSIS — M818 Other osteoporosis without current pathological fracture: Secondary | ICD-10-CM | POA: Diagnosis not present

## 2019-09-03 DIAGNOSIS — Z9181 History of falling: Secondary | ICD-10-CM | POA: Diagnosis not present

## 2019-09-05 DIAGNOSIS — R278 Other lack of coordination: Secondary | ICD-10-CM | POA: Diagnosis not present

## 2019-09-05 DIAGNOSIS — Z9181 History of falling: Secondary | ICD-10-CM | POA: Diagnosis not present

## 2019-09-05 DIAGNOSIS — M6281 Muscle weakness (generalized): Secondary | ICD-10-CM | POA: Diagnosis not present

## 2019-09-05 DIAGNOSIS — R2681 Unsteadiness on feet: Secondary | ICD-10-CM | POA: Diagnosis not present

## 2019-09-05 DIAGNOSIS — M818 Other osteoporosis without current pathological fracture: Secondary | ICD-10-CM | POA: Diagnosis not present

## 2019-09-05 DIAGNOSIS — S06300A Unspecified focal traumatic brain injury without loss of consciousness, initial encounter: Secondary | ICD-10-CM | POA: Diagnosis not present

## 2019-09-06 DIAGNOSIS — Z9181 History of falling: Secondary | ICD-10-CM | POA: Diagnosis not present

## 2019-09-06 DIAGNOSIS — R2681 Unsteadiness on feet: Secondary | ICD-10-CM | POA: Diagnosis not present

## 2019-09-06 DIAGNOSIS — S06300A Unspecified focal traumatic brain injury without loss of consciousness, initial encounter: Secondary | ICD-10-CM | POA: Diagnosis not present

## 2019-09-06 DIAGNOSIS — R278 Other lack of coordination: Secondary | ICD-10-CM | POA: Diagnosis not present

## 2019-09-06 DIAGNOSIS — M6281 Muscle weakness (generalized): Secondary | ICD-10-CM | POA: Diagnosis not present

## 2019-09-06 DIAGNOSIS — M818 Other osteoporosis without current pathological fracture: Secondary | ICD-10-CM | POA: Diagnosis not present

## 2019-09-08 ENCOUNTER — Other Ambulatory Visit: Payer: Self-pay | Admitting: Family Medicine

## 2019-09-09 ENCOUNTER — Emergency Department
Admission: EM | Admit: 2019-09-09 | Discharge: 2019-09-09 | Disposition: A | Discharge status: Home / Self Care | Payer: Medicare Other | Attending: Student in an Organized Health Care Education/Training Program | Admitting: Student in an Organized Health Care Education/Training Program

## 2019-09-09 ENCOUNTER — Other Ambulatory Visit: Payer: Self-pay

## 2019-09-09 ENCOUNTER — Emergency Department: Payer: Medicare Other

## 2019-09-09 ENCOUNTER — Encounter: Payer: Self-pay | Admitting: Emergency Medicine

## 2019-09-09 DIAGNOSIS — S065X0A Traumatic subdural hemorrhage without loss of consciousness, initial encounter: Secondary | ICD-10-CM | POA: Diagnosis not present

## 2019-09-09 DIAGNOSIS — S065XAA Traumatic subdural hemorrhage with loss of consciousness status unknown, initial encounter: Secondary | ICD-10-CM

## 2019-09-09 DIAGNOSIS — R451 Restlessness and agitation: Secondary | ICD-10-CM | POA: Insufficient documentation

## 2019-09-09 DIAGNOSIS — R5381 Other malaise: Secondary | ICD-10-CM | POA: Diagnosis not present

## 2019-09-09 DIAGNOSIS — M79605 Pain in left leg: Secondary | ICD-10-CM | POA: Diagnosis not present

## 2019-09-09 DIAGNOSIS — Z79899 Other long term (current) drug therapy: Secondary | ICD-10-CM | POA: Diagnosis not present

## 2019-09-09 DIAGNOSIS — W19XXXA Unspecified fall, initial encounter: Secondary | ICD-10-CM | POA: Diagnosis not present

## 2019-09-09 DIAGNOSIS — S065X0D Traumatic subdural hemorrhage without loss of consciousness, subsequent encounter: Secondary | ICD-10-CM | POA: Diagnosis not present

## 2019-09-09 DIAGNOSIS — I10 Essential (primary) hypertension: Secondary | ICD-10-CM | POA: Diagnosis not present

## 2019-09-09 DIAGNOSIS — S065X9A Traumatic subdural hemorrhage with loss of consciousness of unspecified duration, initial encounter: Secondary | ICD-10-CM

## 2019-09-09 DIAGNOSIS — I6200 Nontraumatic subdural hemorrhage, unspecified: Secondary | ICD-10-CM | POA: Diagnosis not present

## 2019-09-09 DIAGNOSIS — S065X0S Traumatic subdural hemorrhage without loss of consciousness, sequela: Secondary | ICD-10-CM | POA: Diagnosis not present

## 2019-09-09 DIAGNOSIS — R531 Weakness: Secondary | ICD-10-CM | POA: Diagnosis not present

## 2019-09-09 DIAGNOSIS — Z7401 Bed confinement status: Secondary | ICD-10-CM | POA: Diagnosis not present

## 2019-09-09 DIAGNOSIS — R410 Disorientation, unspecified: Secondary | ICD-10-CM | POA: Insufficient documentation

## 2019-09-09 DIAGNOSIS — M2550 Pain in unspecified joint: Secondary | ICD-10-CM | POA: Diagnosis not present

## 2019-09-09 DIAGNOSIS — R269 Unspecified abnormalities of gait and mobility: Secondary | ICD-10-CM | POA: Diagnosis not present

## 2019-09-09 DIAGNOSIS — R609 Edema, unspecified: Secondary | ICD-10-CM | POA: Diagnosis not present

## 2019-09-09 DIAGNOSIS — R58 Hemorrhage, not elsewhere classified: Secondary | ICD-10-CM | POA: Diagnosis not present

## 2019-09-09 DIAGNOSIS — L89523 Pressure ulcer of left ankle, stage 3: Secondary | ICD-10-CM | POA: Diagnosis not present

## 2019-09-09 DIAGNOSIS — X58XXXD Exposure to other specified factors, subsequent encounter: Secondary | ICD-10-CM | POA: Diagnosis not present

## 2019-09-09 NOTE — ED Notes (Signed)
[facility]  CALLED  FOR  TRANSPORT 

## 2019-09-09 NOTE — ED Provider Notes (Signed)
[name]    First MD Initiated Contact with Patient 09/09/19 1022     (approximate)  I have reviewed the triage vital signs and the nursing notes.   HISTORY  Chief Complaint Follow-up    HPI [name] is a 85 y.o. female with recent diagnosis of subdural hematoma transferred to [facility] for neurosurgery consultation presents from home place due to confusion agitation and complaining of a headache.  No interval trauma.  States that she is evaluated by doctors making house calls this morning was recommended to come to the ER for repeat neuro imaging and then return to the home place for recommended hospice consultation and placement.  Family has no additional complaints or concerns.  They state that after discussion at [facility] they did not want to proceed with any intervention and that her goals of care are for comfort measures wants to avoid hospitalizations.    Past Medical History:  Diagnosis Date  . Cancer ([place]) 08/27/2018   left outer arm some type of skin cancer  . Eye infection   . Falls   . Pelvic fracture ([facility])    Family History  Problem Relation Age of Onset  . Cancer Sister   . Cancer Sister    Past Surgical History:  Procedure Laterality Date  . ABDOMINAL HYSTERECTOMY    . CESAREAN SECTION    . skin cancer removed  08/27/2018   left outer upper arm   Patient Active Problem List   Diagnosis Date Noted  . Age-related osteoporosis with current pathological fracture of left femur with routine healing 08/21/2016  . Pelvic fracture, closed, initial encounter 07/29/2016  . Edema leg 06/17/2016  . Acid reflux 06/17/2016  . Bleeding gastrointestinal 06/17/2016  . BP (high blood pressure) 06/17/2016  . Adult hypothyroidism 06/17/2016  . Colitis, ischemic ([place]) 06/17/2016  . Osteoarthrosis 06/17/2016  . OP (osteoporosis) 06/17/2016  . Gastroduodenal ulcer 06/17/2016  . Chronic follicular conjunctivitis  03/02/2016  . History of surgical procedure 03/02/2016  . 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 Personal history of healed traumatic fracture 12/21/2015  . Corneal perforation of left eye 12/18/2015  . Hyponatremia 12/16/2015  . Rib fracture 12/14/2015      Prior to Admission medications   Medication Sig Start Date End Date Taking? Authorizing Provider  acetaminophen (TYLENOL) 325 MG tablet Take 650 mg by mouth every 6 (six) hours as needed for mild pain or moderate pain.   Yes [provider]  acetaminophen (TYLENOL) 650 MG CR tablet Take 650 mg by mouth every 4 (four) hours as needed for pain.   Yes [provider]  Cholecalciferol (VITAMIN D) 50 MCG (2000 UT) CAPS Take 2,000 Units by mouth daily.   Yes [provider]  Cyanocobalamin (VITAMIN B-12 CR) 1000 MCG TBCR Take 1,000 mcg by mouth daily.   Yes [provider]  diphenhydramine-acetaminophen (TYLENOL PM) 25-500 MG TABS tablet Take 1 tablet by mouth at bedtime as needed (insomnia).   Yes [provider]  ferrous sulfate 325 (65 FE) MG tablet Take 325 mg by mouth daily with breakfast.   Yes [provider]  guaiFENesin-dextromethorphan (ROBITUSSIN DM) 100-10 MG/5ML syrup Take 5 mLs by mouth every 4 (four) hours as needed for cough. Patient taking differently: Take 5 mLs by mouth 2 (two) times daily as needed for cough.  04/11/18  Yes Jerrol Banana., MD  levobunolol (BETAGAN) 0.5 % ophthalmic solution PLACE 1 DROP INTO RIGHT EYE  2 TIMES DAILY Patient taking differently: Place 1 drop into the right eye 2 (two) times daily.  08/26/19  Yes Jerrol Banana., MD  levothyroxine (SYNTHROID, LEVOTHROID) 50 MCG tablet TAKE 1 TABLET BY MOUTH ONCE DAILY FOR HYPOTHYROIDISM.  ** GETS MEDS FROM [facility] ** Patient taking differently: Take 50 mcg by mouth daily.  01/11/19  Yes Jerrol Banana., MD  loperamide (IMODIUM) 2 MG capsule Take 2 mg by mouth every 8 (eight) hours as needed  for diarrhea or loose stools.   Yes [provider]  loratadine (CLARITIN) 10 MG tablet Take 1 tablet (10 mg total) by mouth daily. For allergic rhinitis when it flares - may use as needed once a day. Patient taking differently: Take 10 mg by mouth daily as needed for allergies.  04/16/18  Yes Chrismon, Vickki Muff, PA  Melatonin 3 MG TABS TAKE 1 TABLET (3 MG) BY MOUTH ONCE DAILY AT BEDTIME FOR SLEEP Patient taking differently: Take 6 mg by mouth at bedtime.  10/03/18  Yes Jerrol Banana., MD  metoprolol tartrate (LOPRESSOR) 50 MG tablet Take 50 mg by mouth daily. (hold for pulse <60)   Yes [provider]  moxifloxacin (VIGAMOX) 0.5 % ophthalmic solution INSTILL 1 DROP INTO BOTH EYES TWICE A DAY Patient taking differently: Place 1 drop into both eyes 2 (two) times daily.  03/11/19  Yes Jerrol Banana., MD  nystatin cream (MYCOSTATIN) Apply 1 application topically daily. (apply to groin area)   Yes [provider]  pantoprazole (PROTONIX) 40 MG tablet TAKE 1 TABLET BY MOUTH ONCE DAILY FOR GERD Patient taking differently: Take 40 mg by mouth daily.  10/15/18  Yes Jerrol Banana., MD  phenazopyridine (PYRIDIUM) 200 MG tablet Take 1 tablet (200 mg total) by mouth 3 (three) times daily as needed for pain. 09/18/18  Yes Chrismon, Vickki Muff, PA  polyethylene glycol (MIRALAX / GLYCOLAX) 17 g packet Take 8.5 g by mouth 2 (two) times a week. Monday and Thursdays   Yes [provider]  polyethylene glycol (MIRALAX / GLYCOLAX) 17 g packet Take 17 g by mouth every other day as needed for moderate constipation.   Yes [provider]  ramipril (ALTACE) 5 MG capsule TAKE 1 CAPSULE BY MOUTH ONCE DAILY FOR BLOOD PRESSURE Patient taking differently: Take 5 mg by mouth daily.  10/27/17  Yes Jerrol Banana., MD  Skin Protectants, Misc. (ENDIT EX) Apply 1 application topically 3 (three) times daily as needed (skin breakdown on buttocks)).   Yes [provider]  traZODone (DESYREL) 50 MG tablet Take 25 mg by mouth at bedtime.   Yes [provider]  white petrolatum ointment Apply topically as needed for dry skin. Apply to left and  Right lower legs   Yes [provider]  dextromethorphan (DELSYM COUGH CHILDRENS) 30 MG/5ML liquid Take 5 mLs (30 mg total) by mouth as needed for cough. Twice a day for 1 week. Patient not taking: Reported on 09/09/2019 04/16/18   Chrismon, Vickki Muff, PA    Allergies Ceftin [cefuroxime axetil], Risedronate, Sulfa antibiotics, and Teriparatide    Social History Social History   Tobacco Use  . Smoking status: Never Smoker  . Smokeless tobacco: Never Used  Substance Use Topics  . Alcohol use: No  . Drug use: No    Review of Systems Patient denies headaches, rhinorrhea, blurry vision, numbness, shortness of breath, chest pain, edema, cough, abdominal pain, nausea, vomiting, diarrhea, dysuria,  fevers, rashes or hallucinations unless otherwise stated above in HPI. ____________________________________________   PHYSICAL EXAM:  VITAL SIGNS: Vitals:   09/09/19 1100 09/09/19 1135  BP: 134/61 (!) 145/71  Pulse: 66 69  Resp: 17 16  Temp:    SpO2: 97% 96%    Constitutional: Alert but confused Eyes: Conjunctivae are normal. Left eye implant Head: Atraumatic. Nose: No congestion/rhinnorhea. Mouth/Throat: Mucous membranes are moist.   Neck: No stridor. Painless ROM.  Cardiovascular: Normal rate, regular rhythm. Grossly normal heart sounds.  Good peripheral circulation. Respiratory: Normal respiratory effort.  No retractions. Lungs CTAB. Gastrointestinal: Soft and nontender. No distention. No abdominal bruits. No CVA tenderness. Genitourinary:  Musculoskeletal: No lower extremity tenderness nor edema.  No joint effusions. Neurologic:  Drowsy, left sided weakness and droop Skin:  Skin is warm, dry and intact. No rash noted. Psychiatric: Mood and affect are normal. Speech and  behavior are normal.  ____________________________________________   LABS (all labs ordered are listed, but only abnormal results are displayed)  No results found for this or any previous visit (from the past 24 hour(s)). ____________________________________________ ____________________________________________  [id number]  I personally reviewed all radiographic images ordered to evaluate for the above acute complaints and reviewed radiology reports and findings.  These findings were personally discussed with the patient.  Please see medical record for radiology report.  ____________________________________________   PROCEDURES  Procedure(s) performed:  Procedures    Critical Care performed: no ____________________________________________   INITIAL IMPRESSION / ASSESSMENT AND PLAN / ED COURSE  Pertinent labs & imaging results that were available during my care of the patient were reviewed by me and considered in my medical decision making (see chart for details).   DDX: Subdural, subarachnoid, concussion  Tykeira Fortes is a 85 y.o. who presents to the ED with symptoms as described above with recent neurotrauma here for repeat head CT and discussion of goals of care.  Clinical Course as of Sep 09 1155  Mon Sep 09, 2019  1148 Discussed imaging results with family at bedside.  They state that the goal of care is to allow for natural death and would prefer to be taken back to home place and arrange for hospice.  Will have family confirmed that they are able to manage these types of symptoms prior to discharge.  Currently appears comfortable and in no acute distress   [PR]    Clinical Course User Index [PR] Merlyn Lot, MD    The patient was evaluated in Emergency Department today for the symptoms described in the history of present illness. He/she was evaluated in the context of the global COVID-19 pandemic, which necessitated consideration that the patient might be at  risk for infection with the SARS-CoV-2 virus that causes COVID-19. Institutional protocols and algorithms that pertain to the evaluation of patients at risk for COVID-19 are in a state of rapid change based on information released by regulatory bodies including the CDC and federal and state organizations. These policies and algorithms were followed during the patient's care in the ED.  As part of my medical decision making, I reviewed the following data within the [place] notes reviewed and incorporated, Labs reviewed, notes from prior ED visits and [place] Controlled Substance Database   ____________________________________________   FINAL CLINICAL IMPRESSION(S) / ED DIAGNOSES  Final diagnoses:  SDH (subdural hematoma) (HCC)      NEW MEDICATIONS STARTED DURING THIS VISIT:  New Prescriptions   No medications on file     [name]:  This  document was prepared using Systems analyst and may include unintentional dictation errors.    Merlyn Lot, MD 09/09/19 [phone]

## 2019-09-09 NOTE — ED Triage Notes (Signed)
Patient presents to ED via [facility] from [facility] place. Family wanted patient to be evaluated today. Family reports increasing weakness since a fall 3 weeks ago that resulted in a SDH. Patient denies any pain. A&O x4 on arrival.

## 2019-09-10 DIAGNOSIS — L89520 Pressure ulcer of left ankle, unstageable: Secondary | ICD-10-CM | POA: Diagnosis not present

## 2019-09-10 DIAGNOSIS — Z8781 Personal history of (healed) traumatic fracture: Secondary | ICD-10-CM | POA: Diagnosis not present

## 2019-09-10 DIAGNOSIS — I69354 Hemiplegia and hemiparesis following cerebral infarction affecting left non-dominant side: Secondary | ICD-10-CM | POA: Diagnosis not present

## 2019-09-10 DIAGNOSIS — E039 Hypothyroidism, unspecified: Secondary | ICD-10-CM | POA: Diagnosis not present

## 2019-09-10 DIAGNOSIS — K219 Gastro-esophageal reflux disease without esophagitis: Secondary | ICD-10-CM | POA: Diagnosis not present

## 2019-09-10 DIAGNOSIS — S065X9D Traumatic subdural hemorrhage with loss of consciousness of unspecified duration, subsequent encounter: Secondary | ICD-10-CM | POA: Diagnosis not present

## 2019-09-10 DIAGNOSIS — R627 Adult failure to thrive: Secondary | ICD-10-CM | POA: Diagnosis not present

## 2019-09-10 DIAGNOSIS — R634 Abnormal weight loss: Secondary | ICD-10-CM | POA: Diagnosis not present

## 2019-09-10 DIAGNOSIS — H409 Unspecified glaucoma: Secondary | ICD-10-CM | POA: Diagnosis not present

## 2019-09-10 DIAGNOSIS — D649 Anemia, unspecified: Secondary | ICD-10-CM | POA: Diagnosis not present

## 2019-09-10 DIAGNOSIS — M810 Age-related osteoporosis without current pathological fracture: Secondary | ICD-10-CM | POA: Diagnosis not present

## 2019-09-10 DIAGNOSIS — I10 Essential (primary) hypertension: Secondary | ICD-10-CM | POA: Diagnosis not present

## 2019-09-12 DIAGNOSIS — K219 Gastro-esophageal reflux disease without esophagitis: Secondary | ICD-10-CM | POA: Diagnosis not present

## 2019-09-12 DIAGNOSIS — H409 Unspecified glaucoma: Secondary | ICD-10-CM | POA: Diagnosis not present

## 2019-09-12 DIAGNOSIS — L89520 Pressure ulcer of left ankle, unstageable: Secondary | ICD-10-CM | POA: Diagnosis not present

## 2019-09-12 DIAGNOSIS — R627 Adult failure to thrive: Secondary | ICD-10-CM | POA: Diagnosis not present

## 2019-09-12 DIAGNOSIS — E039 Hypothyroidism, unspecified: Secondary | ICD-10-CM | POA: Diagnosis not present

## 2019-09-12 DIAGNOSIS — D649 Anemia, unspecified: Secondary | ICD-10-CM | POA: Diagnosis not present

## 2019-09-12 DIAGNOSIS — R634 Abnormal weight loss: Secondary | ICD-10-CM | POA: Diagnosis not present

## 2019-09-12 DIAGNOSIS — M810 Age-related osteoporosis without current pathological fracture: Secondary | ICD-10-CM | POA: Diagnosis not present

## 2019-09-12 DIAGNOSIS — I69354 Hemiplegia and hemiparesis following cerebral infarction affecting left non-dominant side: Secondary | ICD-10-CM | POA: Diagnosis not present

## 2019-09-12 DIAGNOSIS — I10 Essential (primary) hypertension: Secondary | ICD-10-CM | POA: Diagnosis not present

## 2019-09-12 DIAGNOSIS — Z8781 Personal history of (healed) traumatic fracture: Secondary | ICD-10-CM | POA: Diagnosis not present

## 2019-09-12 DIAGNOSIS — S065X9D Traumatic subdural hemorrhage with loss of consciousness of unspecified duration, subsequent encounter: Secondary | ICD-10-CM | POA: Diagnosis not present

## 2019-09-16 DIAGNOSIS — R464 Slowness and poor responsiveness: Secondary | ICD-10-CM | POA: Diagnosis not present

## 2019-09-16 DIAGNOSIS — L89523 Pressure ulcer of left ankle, stage 3: Secondary | ICD-10-CM | POA: Diagnosis not present

## 2019-09-16 DIAGNOSIS — S065X0S Traumatic subdural hemorrhage without loss of consciousness, sequela: Secondary | ICD-10-CM | POA: Diagnosis not present

## 2019-09-16 DIAGNOSIS — R410 Disorientation, unspecified: Secondary | ICD-10-CM | POA: Diagnosis not present

## 2019-09-17 DIAGNOSIS — L89520 Pressure ulcer of left ankle, unstageable: Secondary | ICD-10-CM | POA: Diagnosis not present

## 2019-09-17 DIAGNOSIS — D649 Anemia, unspecified: Secondary | ICD-10-CM | POA: Diagnosis not present

## 2019-09-17 DIAGNOSIS — S065X9D Traumatic subdural hemorrhage with loss of consciousness of unspecified duration, subsequent encounter: Secondary | ICD-10-CM | POA: Diagnosis not present

## 2019-09-17 DIAGNOSIS — I69354 Hemiplegia and hemiparesis following cerebral infarction affecting left non-dominant side: Secondary | ICD-10-CM | POA: Diagnosis not present

## 2019-09-17 DIAGNOSIS — I10 Essential (primary) hypertension: Secondary | ICD-10-CM | POA: Diagnosis not present

## 2019-09-17 DIAGNOSIS — R627 Adult failure to thrive: Secondary | ICD-10-CM | POA: Diagnosis not present

## 2019-10-13 DEATH — deceased
